# Patient Record
Sex: Female | Born: 1940 | Race: White | Hispanic: No | Marital: Married | Smoking: Former smoker
Health system: Southern US, Community
[De-identification: ages and names within clinical notes are randomized; demographics above are authoritative.]

## PROBLEM LIST (undated history)

## (undated) DIAGNOSIS — I779 Disorder of arteries and arterioles, unspecified: Secondary | ICD-10-CM

## (undated) DIAGNOSIS — H35073 Retinal telangiectasis, bilateral: Secondary | ICD-10-CM

## (undated) DIAGNOSIS — E039 Hypothyroidism, unspecified: Secondary | ICD-10-CM

## (undated) DIAGNOSIS — I10 Essential (primary) hypertension: Secondary | ICD-10-CM

## (undated) DIAGNOSIS — I739 Peripheral vascular disease, unspecified: Secondary | ICD-10-CM

## (undated) DIAGNOSIS — Z952 Presence of prosthetic heart valve: Secondary | ICD-10-CM

## (undated) DIAGNOSIS — I350 Nonrheumatic aortic (valve) stenosis: Secondary | ICD-10-CM

## (undated) DIAGNOSIS — G459 Transient cerebral ischemic attack, unspecified: Secondary | ICD-10-CM

## (undated) DIAGNOSIS — M1990 Unspecified osteoarthritis, unspecified site: Secondary | ICD-10-CM

## (undated) DIAGNOSIS — E785 Hyperlipidemia, unspecified: Secondary | ICD-10-CM

## (undated) DIAGNOSIS — I4510 Unspecified right bundle-branch block: Secondary | ICD-10-CM

## (undated) DIAGNOSIS — I2510 Atherosclerotic heart disease of native coronary artery without angina pectoris: Secondary | ICD-10-CM

## (undated) DIAGNOSIS — G8929 Other chronic pain: Secondary | ICD-10-CM

## (undated) DIAGNOSIS — G4730 Sleep apnea, unspecified: Secondary | ICD-10-CM

## (undated) DIAGNOSIS — M549 Dorsalgia, unspecified: Secondary | ICD-10-CM

## (undated) HISTORY — PX: CARDIAC CATHETERIZATION: SHX172

## (undated) HISTORY — PX: TONSILLECTOMY AND ADENOIDECTOMY: SHX28

## (undated) HISTORY — PX: AORTIC VALVE REPLACEMENT: SHX41

## (undated) HISTORY — DX: Atherosclerotic heart disease of native coronary artery without angina pectoris: I25.10

## (undated) HISTORY — DX: Dorsalgia, unspecified: M54.9

## (undated) HISTORY — DX: Hyperlipidemia, unspecified: E78.5

## (undated) HISTORY — DX: Retinal telangiectasis, bilateral: H35.073

## (undated) HISTORY — PX: CAROTID ENDARTERECTOMY: SUR193

## (undated) HISTORY — DX: Essential (primary) hypertension: I10

## (undated) HISTORY — DX: Transient cerebral ischemic attack, unspecified: G45.9

## (undated) HISTORY — PX: BREAST SURGERY: SHX581

## (undated) HISTORY — DX: Other chronic pain: G89.29

## (undated) HISTORY — DX: Unspecified osteoarthritis, unspecified site: M19.90

## (undated) HISTORY — DX: Peripheral vascular disease, unspecified: I73.9

## (undated) HISTORY — PX: APPENDECTOMY: SHX54

## (undated) HISTORY — DX: Hypothyroidism, unspecified: E03.9

## (undated) HISTORY — PX: CHOLECYSTECTOMY: SHX55

## (undated) HISTORY — PX: CORONARY ANGIOPLASTY: SHX604

## (undated) HISTORY — DX: Nonrheumatic aortic (valve) stenosis: I35.0

## (undated) HISTORY — DX: Unspecified right bundle-branch block: I45.10

---

## 2007-07-18 LAB — HM COLONOSCOPY

## 2016-08-17 HISTORY — PX: BACK SURGERY: SHX140

## 2017-07-24 ENCOUNTER — Ambulatory Visit: Payer: Medicare Other | Admitting: Primary Care

## 2017-07-24 ENCOUNTER — Encounter: Payer: Self-pay | Admitting: Primary Care

## 2017-07-24 VITALS — BP 136/80 | HR 60 | Temp 97.6°F | Ht 59.5 in | Wt 197.8 lb

## 2017-07-24 DIAGNOSIS — E782 Mixed hyperlipidemia: Secondary | ICD-10-CM | POA: Insufficient documentation

## 2017-07-24 DIAGNOSIS — I10 Essential (primary) hypertension: Secondary | ICD-10-CM | POA: Diagnosis not present

## 2017-07-24 DIAGNOSIS — H35073 Retinal telangiectasis, bilateral: Secondary | ICD-10-CM | POA: Insufficient documentation

## 2017-07-24 DIAGNOSIS — E785 Hyperlipidemia, unspecified: Secondary | ICD-10-CM | POA: Insufficient documentation

## 2017-07-24 DIAGNOSIS — E039 Hypothyroidism, unspecified: Secondary | ICD-10-CM

## 2017-07-24 DIAGNOSIS — I2510 Atherosclerotic heart disease of native coronary artery without angina pectoris: Secondary | ICD-10-CM | POA: Diagnosis not present

## 2017-07-24 LAB — COMPREHENSIVE METABOLIC PANEL
ALBUMIN: 4.4 g/dL (ref 3.5–5.2)
ALK PHOS: 68 U/L (ref 39–117)
ALT: 16 U/L (ref 0–35)
AST: 20 U/L (ref 0–37)
BILIRUBIN TOTAL: 0.9 mg/dL (ref 0.2–1.2)
BUN: 14 mg/dL (ref 6–23)
CALCIUM: 9.5 mg/dL (ref 8.4–10.5)
CO2: 29 mEq/L (ref 19–32)
CREATININE: 0.78 mg/dL (ref 0.40–1.20)
Chloride: 103 mEq/L (ref 96–112)
GFR: 76.22 mL/min (ref >60.00)
Glucose, Bld: 113 mg/dL — ABNORMAL HIGH (ref 70–99)
Potassium: 4.3 mEq/L (ref 3.5–5.1)
Sodium: 139 mEq/L (ref 135–145)
TOTAL PROTEIN: 7.4 g/dL (ref 6.0–8.3)

## 2017-07-24 LAB — TSH: TSH: 1.76 u[IU]/mL (ref 0.35–4.50)

## 2017-07-24 NOTE — Progress Notes (Signed)
Subjective:    Patient ID: Victoria Burns, female    DOB: 1941/03/22, 77 y.o.   MRN: [id number]  HPI  [name] is a 77 year old female who presents today to establish care and discuss the problems mentioned below. Will obtain old records. She's had no recent physical.  She and her husband recently moved from [place].  1) Hypothyroidism: Currently managed on levothyroxine 25 mcg. Her last TSH was checked 6 months which was normal. She's not had a dose change in her medication for the past 6 years.   2) Hyperlipidemia/CAD/Carotid Stenosis: History of cardiac cath with 5 stents placed, also with bilateral endarcardectomy. Currently managed on atorvastatin 80 mg. She was following with cardiology in [facility] semi-annually.   3) Essential Hypertension: Currently managed on losartan 100 mg, spironolactone 25 mg, and metoprolol tartrate 100 mg BID. She thinks she's supposed to be taking metoprolol 50 mg BID, so she's been cutting the 100 mg tablets in half for years.  She denies chest pain, shortness of breath, dizziness.  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 Underwent back surgery with implantation of rods and screws in February 2018. She was once receiveing injections to her back but was told that further injections would not be helpful.  Overall she is doing well since surgery and has no complaints.  Review of Systems  Constitutional: Negative for fatigue.  Respiratory: Negative for shortness of breath.   Cardiovascular: Negative for chest pain.  Musculoskeletal: Negative for back pain.  Neurological: Negative for dizziness and headaches.       Past Medical History:  Diagnosis Date  . Arthritis   . Arthritis   . Heart disease   . Heart murmur   . Hyperlipidemia   . Hypertension   . Juxtafoveal telangietasis of both eyes   . Thyroid disease      Social History   Socioeconomic  History  . Marital status: Married    Spouse name: Not on file  . Number of children: Not on file  . Years of education: Not on file  . Highest education level: Not on file  Social Needs  . Financial resource strain: Not on file  . Food insecurity - worry: Not on file  . Food insecurity - inability: Not on file  . Transportation needs - medical: Not on file  . Transportation needs - non-medical: Not on file  Occupational History  . Not on file  Tobacco Use  . Smoking status: Former Research scientist (life sciences)  . Smokeless tobacco: Never Used  Substance and Sexual Activity  . Alcohol use: No    Frequency: Never  . Drug use: No  . Sexual activity: Yes  Other Topics Concern  . Not on file  Social History Narrative  . Not on file      Family History  Problem Relation Age of Onset  . Heart disease Mother   . Hyperlipidemia Mother   . Hypertension Mother   . Alcohol abuse Father   . COPD Father   . Heart attack Father   . Arthritis Sister   . Hypertension Sister   . Cancer Sister   . COPD Sister   . Heart attack Sister   . Heart disease Sister   . Hypertension Sister     Allergies  Allergen Reactions  . Codeine Rash  . Penicillins Rash    Current Outpatient Medications on File Prior to  Visit  Medication Sig Dispense Refill  . aspirin 81 MG chewable tablet Chew 81 mg by mouth daily.    Marland Kitchen atorvastatin (LIPITOR) 80 MG tablet Take 80 mg by mouth at bedtime.    . Levothyroxine Sodium 25 MCG CAPS Take by mouth daily before breakfast.    . losartan (COZAAR) 100 MG tablet Take 100 mg by mouth daily.    . metoprolol tartrate (LOPRESSOR) 50 MG tablet Take 50 mg by mouth 2 (two) times daily.    . simvastatin (ZOCOR) 10 MG tablet Take 10 mg by mouth at bedtime.    Marland Kitchen spironolactone (ALDACTONE) 25 MG tablet Take 25 mg by mouth daily.     No current facility-administered medications on file prior to visit.     BP 136/80   Pulse 60   Temp 97.6 F (36.4 C) (Oral)   Ht 4' 11.5" (1.511 m)    Wt 197 lb 12 oz (89.7 kg)   SpO2 98%   BMI 39.27 kg/m    Objective:   Physical Exam  Constitutional: She appears well-nourished.  Neck: Neck supple.  Cardiovascular: Normal rate and regular rhythm.  Pulmonary/Chest: Effort normal and breath sounds normal.  Skin: Skin is warm and dry.  Psychiatric: She has a normal mood and affect.          Assessment & Plan:

## 2017-07-24 NOTE — Assessment & Plan Note (Signed)
Endorses recent normal lipid panel.  Continue atorvastatin 80 mg.  Will obtain records for recent panel.

## 2017-07-24 NOTE — Assessment & Plan Note (Signed)
TSH pending today.  Will obtain records from prior PCP.

## 2017-07-24 NOTE — Assessment & Plan Note (Addendum)
History of stent placement x5, bilateral endarcardectomy.  Continue aspirin, atorvastatin.  Referral placed for local cardiologist.  Will obtain records.

## 2017-07-24 NOTE — Patient Instructions (Signed)
You will be contacted regarding your referral to cardiology and opthalmology.  Please let us know if you have not been contacted within one week.   Stop by the lab prior to leaving today. I will notify you of your results once received.   Continue taking 1/2 tablet of your metoprolol twice daily, I'll change your prescription to 50 mg twice daily for your next refill. Please notify [facility] that I'll be your new PCP.  Stop taking Simvastatin for now until I can review your records.   It was a pleasure to meet you today! Please don't hesitate to call or message me with any questions. Welcome to [facility]!

## 2017-07-24 NOTE — Assessment & Plan Note (Signed)
Stable in the office today.  Continue losartan, spironolactone, metoprolol.  Will correct dose of metoprolol to 50 mg twice daily to avoid having to cut tablets in half.

## 2017-07-24 NOTE — Assessment & Plan Note (Signed)
Referral placed for local ophthalmologist.

## 2017-07-31 ENCOUNTER — Encounter: Payer: Self-pay | Admitting: Primary Care

## 2017-08-15 DIAGNOSIS — H35073 Retinal telangiectasis, bilateral: Secondary | ICD-10-CM | POA: Diagnosis not present

## 2017-08-18 DIAGNOSIS — I6529 Occlusion and stenosis of unspecified carotid artery: Secondary | ICD-10-CM | POA: Insufficient documentation

## 2017-08-18 DIAGNOSIS — I739 Peripheral vascular disease, unspecified: Secondary | ICD-10-CM | POA: Insufficient documentation

## 2017-08-18 DIAGNOSIS — I25118 Atherosclerotic heart disease of native coronary artery with other forms of angina pectoris: Secondary | ICD-10-CM | POA: Insufficient documentation

## 2017-08-18 NOTE — Progress Notes (Signed)
Cardiology Office Note  Date:  08/21/2017   ID:  Victoria Burns, DOB 1940/12/16, MRN [id number]  PCP:  Pleas Koch, NP   Chief Complaint  Patient presents with  . New Patient (Initial Visit)    Referred by PCP to get evaluated by a cardiologist. Meds reviewed verbally with patient.     HPI:  Victoria Burns is a 77 year old woman with past medical history of Hypothyroidism Hyperlipidemia Coronary artery disease, history of stenting x1 Hypertension Chronic back pain PAD, bilateral carotid endarterectomy Former smoker Moderate AS Spinal surgery Prior sleep study AHI 13 events per hour and the RDI was 17 events per hour performed May 2016 Referred by [facility] [place] for consultation of her coronary artery disease  Last cardiac catheterization May 17, 2017 [facility] Normal right heart pressures Found to have stable coronary disease, moderate aortic valve stenosis LAD with mild luminal irregularities, left circumflex 30% mid disease, stent in OM 2 with 40% mid in-stent stenosis Right dominant vessel 50% disease  She reports having some fatigue, no chest pain on exertion  Previous stress test January 2018 defect noted in the Anterior anterolateral wall  Carotid ultrasound October 2017 noting 60-79% stenosis on the left  Echocardiogram October 2017 moderate aortic valve stenosis mean gradient 26 mmHg peak gradient 54 mmHg peak velocity 3.7 m/s  Cardiac catheterization January 2018 moderate to severe 60-70% disease proximal second OM vessel, prior stent in the OM 60% distal edge restenosis, moderate diffuse calcification, small vessels throughout  EKG personally reviewed by myself on todays visit Showing sinus bradycardia rate 52 bpm right bundle branch block nonspecific ST abnormality   PMH:   has a past medical history of Arthritis, CAD (coronary artery disease), Chronic back pain, Heart murmur, Hyperlipidemia, Hypertension, Hypothyroidism, Juxtafoveal  telangietasis of both eyes, RBBB, and TIA (transient ischemic attack).  PSH:    Past Surgical History:  Procedure Laterality Date  . APPENDECTOMY    . BACK SURGERY  08/2016  . BREAST SURGERY     Biopsy  . CAROTID ENDARTERECTOMY Bilateral   . CHOLECYSTECTOMY    . TONSILLECTOMY AND ADENOIDECTOMY      Current Outpatient Medications  Medication Sig Dispense Refill  . aspirin 81 MG chewable tablet Chew 81 mg by mouth daily.    Marland Kitchen atorvastatin (LIPITOR) 80 MG tablet Take 80 mg by mouth at bedtime.    . Levothyroxine Sodium 25 MCG CAPS Take by mouth daily before breakfast.    . losartan (COZAAR) 100 MG tablet Take 100 mg by mouth daily.    . metoprolol tartrate (LOPRESSOR) 50 MG tablet Take 1 tablet (50 mg total) by mouth 2 (two) times daily. 180 tablet 3  . spironolactone (ALDACTONE) 25 MG tablet Take 25 mg by mouth daily.     No current facility-administered medications for this visit.      Allergies:   Codeine and Penicillins   Social History:  The patient  reports that she has quit smoking. she has never used smokeless tobacco. She reports that she does not drink alcohol or use drugs.   Family History:   family history includes Alcohol abuse in her father; Arthritis in her sister; COPD in her father and sister; Cancer in her sister; Heart attack in her father and sister; Heart disease in her mother and sister; Hyperlipidemia in her mother; Hypertension in her mother, sister, and sister.    Review of Systems: Review of Systems  Constitutional: Negative.   Respiratory: Positive for shortness of breath.  Cardiovascular: Negative.   Gastrointestinal: Negative.   Musculoskeletal: Negative.   Neurological: Negative.   All other systems reviewed and are negative.    PHYSICAL EXAM: VS:  BP 140/80 (BP Location: Right Arm, Patient Position: Sitting, Cuff Size: Normal)   Pulse (!) 52   Ht 5\' 1"  (1.549 m)   Wt 198 lb (89.8 kg)   BMI 37.41 kg/m  , BMI Body mass index is 37.41  kg/m. GEN: Well nourished, well developed, in no acute distress , obese HEENT: normal  Neck: no JVD, carotid bruits, or masses Cardiac: RRR; no murmurs, rubs, or gallops,no edema  Respiratory:  clear to auscultation bilaterally, normal work of breathing GI: soft, nontender, nondistended, + BS MS: no deformity or atrophy  Skin: warm and dry, no rash Neuro:  Strength and sensation are intact Psych: euthymic mood, full affect    Recent Labs: 07/24/2017: ALT 16; BUN 14; Creatinine, Ser 0.78; Potassium 4.3; Sodium 139; TSH 1.76    Lipid Panel No results found for: CHOL, HDL, LDLCALC, TRIG    Wt Readings from Last 3 Encounters:  08/21/17 198 lb (89.8 kg)  07/24/17 197 lb 12 oz (89.7 kg)       ASSESSMENT AND PLAN:  Essential hypertension Bradycardia, possibly symptomatic with fatigue. Taking metoprolol 50 twice daily New prescription sent in but recommended she try metoprolol 25 twice daily with close monitoring of her blood pressure  Coronary artery disease of native artery of native heart with stable angina pectoris ([place]) - Plan: EKG 12-Lead Previous cardiac catheterization scans reviewed with her in detail Most recently May 17, 2017, moderate nonobstructive disease, medical management recommended Stressed importance of aggressive lipid management  Mixed hyperlipidemia No labs available Recommend she have routine lab work with primary care, fasting lipid panel Goal LDL less than 70 She will stay on Lipitor 80 for now.  Continue all of her prescription is been placed  PAD (peripheral artery disease) ([place]) Known bilateral carotid disease Carotid endarterectomy on the left Consider carotid ultrasound on next clinic visit  Disposition:   F/U  6 months  Previous records requested, Extensive records reviewed, including echocardiograms, cardiac catheterizations, stress testing  Total encounter time more than 60 minutes  Greater than 50% was spent in counseling and  coordination of care with the patient    Orders Placed This Encounter  Procedures  . EKG 12-Lead     Signed, Esmond Plants, M.D., Ph.D. 08/21/2017  [place], [place]

## 2017-08-21 ENCOUNTER — Encounter: Payer: Self-pay | Admitting: Cardiovascular Disease

## 2017-08-21 ENCOUNTER — Ambulatory Visit: Payer: Medicare Other | Admitting: Cardiovascular Disease

## 2017-08-21 VITALS — BP 140/80 | HR 52 | Ht 61.0 in | Wt 198.0 lb

## 2017-08-21 DIAGNOSIS — I10 Essential (primary) hypertension: Secondary | ICD-10-CM | POA: Diagnosis not present

## 2017-08-21 DIAGNOSIS — I739 Peripheral vascular disease, unspecified: Secondary | ICD-10-CM | POA: Diagnosis not present

## 2017-08-21 DIAGNOSIS — I6523 Occlusion and stenosis of bilateral carotid arteries: Secondary | ICD-10-CM

## 2017-08-21 DIAGNOSIS — H35073 Retinal telangiectasis, bilateral: Secondary | ICD-10-CM

## 2017-08-21 DIAGNOSIS — E782 Mixed hyperlipidemia: Secondary | ICD-10-CM

## 2017-08-21 DIAGNOSIS — I25118 Atherosclerotic heart disease of native coronary artery with other forms of angina pectoris: Secondary | ICD-10-CM

## 2017-08-21 MED ORDER — METOPROLOL TARTRATE 50 MG PO TABS: 50.0000 mg | ORAL_TABLET | Freq: Two times a day (BID) | ORAL | 3 refills | Status: DC

## 2017-08-21 NOTE — Patient Instructions (Addendum)
Medication Instructions:   Ok to decrease the metoprolol down to 25 mg twice a day if you have fatigue, low heart beat  Labwork:  No new labs needed  Testing/Procedures:  No further testing at this time   Follow-Up: It was a pleasure seeing you in the office today. Please call us if you have new issues that need to be addressed before your next appt.  [phone]  Your physician wants you to follow-up in: 6 months.  You will receive a reminder letter in the mail two months in advance. If you don't receive a letter, please call our office to schedule the follow-up appointment.  If you need a refill on your cardiac medications before your next appointment, please call your pharmacy.

## 2017-10-26 ENCOUNTER — Telehealth: Payer: Self-pay | Admitting: Primary Care

## 2017-10-26 DIAGNOSIS — M79673 Pain in unspecified foot: Principal | ICD-10-CM

## 2017-10-26 DIAGNOSIS — G8929 Other chronic pain: Secondary | ICD-10-CM

## 2017-10-26 NOTE — Telephone Encounter (Signed)
Noted, referral placed.  

## 2017-10-26 NOTE — Telephone Encounter (Signed)
Copied from [place]. Topic: Referral - Request >> Oct 26, 2017  1:01 PM Marin Olp L wrote: Reason for CRM: Patient would like a referral to a podiatrist and would like it recommended by NP Clark. She does not have any preferred location and it's for arthritis in her feet. Please call her once the referral is placed.

## 2017-11-05 ENCOUNTER — Ambulatory Visit: Payer: Self-pay | Admitting: Podiatry

## 2017-11-12 ENCOUNTER — Other Ambulatory Visit: Payer: Self-pay | Admitting: Podiatry

## 2017-11-12 ENCOUNTER — Ambulatory Visit (INDEPENDENT_AMBULATORY_CARE_PROVIDER_SITE_OTHER): Payer: Medicare Other

## 2017-11-12 ENCOUNTER — Ambulatory Visit: Payer: Medicare Other | Admitting: Podiatry

## 2017-11-12 ENCOUNTER — Encounter: Payer: Self-pay | Admitting: Podiatry

## 2017-11-12 VITALS — BP 173/71 | HR 70

## 2017-11-12 DIAGNOSIS — M1990 Unspecified osteoarthritis, unspecified site: Secondary | ICD-10-CM | POA: Diagnosis not present

## 2017-11-12 DIAGNOSIS — R52 Pain, unspecified: Secondary | ICD-10-CM

## 2017-11-12 DIAGNOSIS — M205X9 Other deformities of toe(s) (acquired), unspecified foot: Secondary | ICD-10-CM

## 2017-11-12 DIAGNOSIS — M129 Arthropathy, unspecified: Secondary | ICD-10-CM

## 2017-11-12 DIAGNOSIS — M2020 Hallux rigidus, unspecified foot: Secondary | ICD-10-CM

## 2017-11-12 MED ORDER — MELOXICAM 7.5 MG PO TABS: 7.5000 mg | ORAL_TABLET | Freq: Every day | ORAL | 0 refills | Status: DC

## 2017-11-12 NOTE — Progress Notes (Signed)
This patient presents to the office with chief complaint of painful feet.  She says she was diagnosed with arthritis of her foot years ago.  She says she is experiencing pain and discomfort through the top of both feet with her left foot more painful than the right.  She says she then starts to experience burning pain noted through the arch into the toes on the bottom of both feet.  She says she has been treated with injection therapy and multiple pairs of orthoses.  She says the pain is worsening and she is concerned about her foot, improving for her summer vacation.  She presents the office today for an evaluation and treatment of her painful feet.  General Appearance  Alert, conversant and in no acute stress.  Vascular  Dorsalis pedis and posterior tibial  pulses are palpable  bilaterally.  Capillary return is within normal limits  bilaterally. Temperature is within normal limits  bilaterally.  Neurologic  Senn-Weinstein monofilament wire test within normal limits  bilaterally. Muscle power within normal limits bilaterally.  Nails normal nails noted with no evidence of any bacterial or fungal infection.  Orthopedic  No limitations of motion of motion feet .  No crepitus or effusions noted.dorsal exostosis noted at the head of the first metatarsal left foot.  Mild HAV deformity bilaterally.  Significant arthritic changes felt at the level of the [facility] joint, left foot, greater than the right. Excessive pronation is noted upon standing, both feet. Flexible pes planus foot type.  Skin  normotropic skin with no porokeratosis noted bilaterally.  No signs of infections or ulcers noted.  Mucoid cyst noted DIPJ third toe, left foot.  Chronic Foot Arthritis  B/L.  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  IE>  X-rays taken reveal calcification at the insertion of the plantar fascia left greater than the right.  Chronic arthritic changes noted at the [facility] bilaterally.  Arthritic  changes first MPJ left foot. Discussed this condition with this patient.  Told her that the flexible pes planus foot type has led to arthritis in the midfoot and the big toe joint, left foot.  I told her she would benefit from a good pair of orthoses in thick soled shoes.  She says she has multiple orthoses which she will go home and start wearing.  Prescribed Mobic 7.5 mg 1 daily.  Return to the clinic in 2 months for further evaluation and treatment   Gardiner Barefoot DPM

## 2017-12-04 ENCOUNTER — Telehealth: Payer: Self-pay | Admitting: Podiatry

## 2017-12-04 MED ORDER — MELOXICAM 7.5 MG PO TABS: ORAL_TABLET | ORAL | 1 refills | Status: DC

## 2017-12-04 NOTE — Addendum Note (Signed)
Addended by: Harriett Sine D on: 12/04/2017 02:49 PM   Modules accepted: Orders

## 2017-12-04 NOTE — Telephone Encounter (Signed)
Pt. Was taking pain medication and does not feel like it is doing anything. Medication is not bothering my stomach, not sure if you want to change the dosage. Refill  RX would need to be sent to [facility] on [address]

## 2017-12-04 NOTE — Telephone Encounter (Addendum)
Dr. Prudence Davidson states if pt is not having any problem taking the meloxicam 7.5mg , then she could take the meloxicam 7.5mg  twice daily with food as needed for pain. I informed pt of Dr. Burnell Blanks orders.

## 2017-12-09 ENCOUNTER — Other Ambulatory Visit: Payer: Self-pay | Admitting: Podiatry

## 2018-01-14 ENCOUNTER — Ambulatory Visit: Payer: Medicare Other | Admitting: Podiatry

## 2018-01-14 ENCOUNTER — Encounter: Payer: Self-pay | Admitting: Podiatry

## 2018-01-14 DIAGNOSIS — M2020 Hallux rigidus, unspecified foot: Secondary | ICD-10-CM

## 2018-01-14 DIAGNOSIS — M205X9 Other deformities of toe(s) (acquired), unspecified foot: Secondary | ICD-10-CM

## 2018-01-14 DIAGNOSIS — M129 Arthropathy, unspecified: Secondary | ICD-10-CM

## 2018-01-14 MED ORDER — GABAPENTIN 100 MG PO CAPS: 100.0000 mg | ORAL_CAPSULE | Freq: Three times a day (TID) | ORAL | 0 refills | Status: DC

## 2018-01-14 NOTE — Progress Notes (Signed)
This patient returns to the office follow-up for continued evaluation of both feet.  She has been diagnosed with chronic for her arthritis on both feet as well as a hallux limitus bilaterally.  She was treated with Mobic 7.5 mg daily, which was then increased to 15 mg daily with minimal relief.  She says that she  is no better and she presents the office for continued evaluation and treatment. She relates her pain today is more of a burning type of pain. She says that her family member was treated with gabapentin and asked if it would help her feet.  She presents the office today wearing sandals for her appointment.    General Appearance  Alert, conversant and in no acute stress.  Vascular  Dorsalis pedis and posterior tibial  pulses are palpable  bilaterally.  Capillary return is within normal limits  bilaterally. Temperature is within normal limits  bilaterally.  Neurologic  Senn-Weinstein monofilament wire test within normal limits  bilaterally. Muscle power within normal limits bilaterally.  Nails normal nails noted with no evidence of any bacterial or fungal infection  Orthopedic  No limitations of motion of motion feet .  No crepitus or effusions noted. Mild HAV deformity bilaterally.  Arthritic changes noted at the mid foot bilaterally with the left greater than the right.  Flexible pes planus foot type.  Skin  normotropic skin with no porokeratosis noted bilaterally.  No signs of infections or ulcers noted.    Chronic arthritis  B/L  FHL  B/L  Pes planus  ROV.  Reexamination of her foot reveals no evidence of any inflammation.  No increased temperature  or swelling noted.  Told patient that her problem is the lack of support when she walks.  Told her she should try the power step insoles and if they are successful to then be evaluated by Liliane Channel.  Prescribed gabapentin 100 mg 1 3 times a day.  Discontinue the Mobic since it has been ineffective.    RTC 4 weeks    Gardiner Barefoot DPM

## 2018-01-21 ENCOUNTER — Ambulatory Visit: Payer: Medicare Other | Admitting: Primary Care

## 2018-01-21 ENCOUNTER — Telehealth: Payer: Self-pay

## 2018-01-21 NOTE — Telephone Encounter (Signed)
Attempted to reach patient to schedule AWV. Patient declined appt at this time.

## 2018-01-23 ENCOUNTER — Ambulatory Visit (INDEPENDENT_AMBULATORY_CARE_PROVIDER_SITE_OTHER): Payer: Medicare Other | Admitting: Primary Care

## 2018-01-23 VITALS — BP 126/68 | HR 57 | Temp 98.0°F | Ht 61.0 in | Wt 201.5 lb

## 2018-01-23 DIAGNOSIS — E782 Mixed hyperlipidemia: Secondary | ICD-10-CM | POA: Diagnosis not present

## 2018-01-23 DIAGNOSIS — I10 Essential (primary) hypertension: Secondary | ICD-10-CM

## 2018-01-23 DIAGNOSIS — I6523 Occlusion and stenosis of bilateral carotid arteries: Secondary | ICD-10-CM | POA: Diagnosis not present

## 2018-01-23 DIAGNOSIS — M8949 Other hypertrophic osteoarthropathy, multiple sites: Secondary | ICD-10-CM

## 2018-01-23 DIAGNOSIS — I25118 Atherosclerotic heart disease of native coronary artery with other forms of angina pectoris: Secondary | ICD-10-CM | POA: Diagnosis not present

## 2018-01-23 DIAGNOSIS — E039 Hypothyroidism, unspecified: Secondary | ICD-10-CM | POA: Diagnosis not present

## 2018-01-23 DIAGNOSIS — M159 Polyosteoarthritis, unspecified: Secondary | ICD-10-CM

## 2018-01-23 DIAGNOSIS — M150 Primary generalized (osteo)arthritis: Secondary | ICD-10-CM | POA: Diagnosis not present

## 2018-01-23 DIAGNOSIS — M1990 Unspecified osteoarthritis, unspecified site: Secondary | ICD-10-CM | POA: Insufficient documentation

## 2018-01-23 LAB — TSH: TSH: 1.76 u[IU]/mL (ref 0.35–4.50)

## 2018-01-23 LAB — LIPID PANEL
CHOL/HDL RATIO: 3
Cholesterol: 123 mg/dL (ref 0–200)
HDL: 48.1 mg/dL (ref >39.00)
LDL CALC: 57 mg/dL (ref 0–99)
NONHDL: 74.71
TRIGLYCERIDES: 90 mg/dL (ref 0.0–149.0)
VLDL: 18 mg/dL (ref 0.0–40.0)

## 2018-01-23 LAB — BASIC METABOLIC PANEL
BUN: 18 mg/dL (ref 6–23)
CO2: 29 mEq/L (ref 19–32)
CREATININE: 0.97 mg/dL (ref 0.40–1.20)
Calcium: 9.8 mg/dL (ref 8.4–10.5)
Chloride: 105 mEq/L (ref 96–112)
GFR: 59.19 mL/min — AB (ref >60.00)
Glucose, Bld: 107 mg/dL — ABNORMAL HIGH (ref 70–99)
POTASSIUM: 4.2 meq/L (ref 3.5–5.1)
Sodium: 141 mEq/L (ref 135–145)

## 2018-01-23 MED ORDER — ATORVASTATIN CALCIUM 80 MG PO TABS: ORAL_TABLET | ORAL | 3 refills | Status: DC

## 2018-01-23 MED ORDER — SPIRONOLACTONE 25 MG PO TABS: ORAL_TABLET | ORAL | 3 refills | Status: DC

## 2018-01-23 MED ORDER — LOSARTAN POTASSIUM 100 MG PO TABS
ORAL_TABLET | ORAL | 3 refills | Status: DC
Start: 2018-01-23 — End: 2019-01-08

## 2018-01-23 MED ORDER — LEVOTHYROXINE SODIUM 25 MCG PO CAPS: ORAL_CAPSULE | ORAL | 3 refills | Status: DC

## 2018-01-23 NOTE — Patient Instructions (Addendum)
Stop by the lab prior to leaving today. I will notify you of your results once received.   Schedule a follow up visit with your cardiologist as soon as possible.  Please schedule a follow up appointment in 6 months for follow up.   It was a pleasure to see you today!

## 2018-01-23 NOTE — Progress Notes (Signed)
Subjective:    Patient ID: Victoria Burns, female    DOB: 1941-04-01, 77 y.o.   MRN: [id number]  HPI  Victoria Burns is a 77 year old female who presents today for follow up.  1) Essential Hypertension: Currently managed on losartan 100 mg, metoprolol tartrate 50 mg BID, and spironolactone 25 mg.   BP Readings from Last 3 Encounters:  01/23/18 126/68  11/12/17 (!) 173/71  08/21/17 140/80   2) Hypothyroidism: Currently managed on levothyroxine 25 mcg. Her last TSH was 1.76 in January 2019. She is taking her levothyroxine every morning with water and is not eating or taking any other medications within 30 mins.   3) Hyperlipidemia: Currently managed on atorvastatin 80 mg. Due for lipid panel today.  4) CAD/Bilatreal Carotid Stenosis: Currently following with cardiology, plans on making an appointment soon. She has noticed intermittent chest pain 1-2 times weekly for the past 2-3 months, at exertion mostly. She will notice some improvement with rest but not total resolve. She's been taking her nitroglycerin more frequently with relief in chest pain almost immediately. She denies esophageal burning, belching, epigastric pain, nausea. She has not notified her cardiologist of these symptoms.  Her pain was never bad enough to seek hospital care.   5) Chronic Foot Pain: Currently following with podiatry and is managed on gabapentin 100 mg TID. Overall she's feeling better. Diagnosed with arthritis.   Review of Systems  Constitutional: Negative for fatigue.  Eyes: Negative for visual disturbance.  Respiratory: Negative for shortness of breath.   Cardiovascular: Positive for chest pain.  Musculoskeletal: Positive for arthralgias.  Neurological: Negative for dizziness.       Past Medical History:  Diagnosis Date  . Arthritis   . CAD (coronary artery disease)   . Chronic back pain   . Heart murmur   . Hyperlipidemia   . Hypertension   . Hypothyroidism   . Juxtafoveal telangietasis of  both eyes   . RBBB   . TIA (transient ischemic attack)      Social History   Socioeconomic History  . Marital status: Married    Spouse name: Not on file  . Number of children: Not on file  . Years of education: Not on file  . Highest education level: Not on file  Occupational History  . Not on file  Social Needs  . Financial resource strain: Not on file  . Food insecurity:    Worry: Not on file    Inability: Not on file  . Transportation needs:    Medical: Not on file    Non-medical: Not on file  Tobacco Use  . Smoking status: Former Research scientist (life sciences)  . Smokeless tobacco: Never Used  Substance and Sexual Activity  . Alcohol use: No    Frequency: Never  . Drug use: No  . Sexual activity: Yes  Lifestyle  . Physical activity:    Days per week: Not on file    Minutes per session: Not on file  . Stress: Not on file  Relationships  . Social connections:    Talks on phone: Not on file    Gets together: Not on file    Attends religious service: Not on file    Active member of club or organization: Not on file    Attends meetings of clubs or organizations: Not on file    Relationship status: Not on file  . Intimate partner violence:    Fear of current or ex partner: Not on file  Emotionally abused: Not on file    Physically abused: Not on file    Forced sexual activity: Not on file  Other Topics Concern  . Not on file  Social History Narrative  . Not on file    Past Surgical History:  Procedure Laterality Date  . APPENDECTOMY    . BACK SURGERY  08/2016  . BREAST SURGERY     Biopsy  . CAROTID ENDARTERECTOMY Bilateral   . CHOLECYSTECTOMY    . TONSILLECTOMY AND ADENOIDECTOMY      Family History  Problem Relation Age of Onset  . Heart disease Mother   . Hyperlipidemia Mother   . Hypertension Mother   . Alcohol abuse Father   . COPD Father   . Heart attack Father   . Arthritis Sister   . Hypertension Sister   . Cancer Sister   . COPD Sister   . Heart attack  Sister   . Heart disease Sister   . Hypertension Sister     Allergies  Allergen Reactions  . Codeine Rash  . Penicillins Rash    Current Outpatient Medications on File Prior to Visit  Medication Sig Dispense Refill  . aspirin 81 MG chewable tablet Chew 81 mg by mouth daily.    Marland Kitchen gabapentin (NEURONTIN) 100 MG capsule Take 1 capsule (100 mg total) by mouth 3 (three) times daily. 90 capsule 0  . metoprolol tartrate (LOPRESSOR) 50 MG tablet Take 1 tablet (50 mg total) by mouth 2 (two) times daily. 180 tablet 3  . nitroGLYCERIN (NITROSTAT) 0.3 MG SL tablet Place 0.3 mg under the tongue every 5 (five) minutes as needed.     No current facility-administered medications on file prior to visit.     BP 126/68   Pulse (!) 57   Temp 98 F (36.7 C) (Oral)   Ht 5\' 1"  (1.549 m)   Wt 201 lb 8 oz (91.4 kg)   SpO2 95%   BMI 38.07 kg/m    Objective:   Physical Exam  Constitutional: She appears well-nourished.  Neck: Neck supple. Carotid bruit is present.  Cardiovascular: Normal rate and regular rhythm.  Murmur heard. Aortic murmur noted on exam, radiation to bilateral carotids.   Respiratory: Effort normal and breath sounds normal.  Skin: Skin is warm and dry.  Psychiatric: She has a normal mood and affect.           Assessment & Plan:

## 2018-01-23 NOTE — Assessment & Plan Note (Signed)
Chronic to feet, following with podiatry and doing well on gabapentin.

## 2018-01-23 NOTE — Assessment & Plan Note (Addendum)
Chest pain intermittently x 2-3 months, improved with nitroglycerin.   ECG today with sinus brady, RBBB, no ST elevation, no t-wave inversion other than in AVR and V1. ECG unchanged from February 2019.  Check BMP to rule out hyperkalemia as she's on spironolactone and losartan. Also check TSH, Lipids today. Strongly advised her to schedule a follow up visit with her cardiologist as she may need stress test.   She is stable for outpatient treatment today.

## 2018-01-23 NOTE — Assessment & Plan Note (Signed)
Lipids due, pending.  Discussed the importance of a healthy diet and regular exercise in order for weight loss, and to reduce the risk of any potential medical problems. Continue high intensity statin.

## 2018-01-23 NOTE — Assessment & Plan Note (Signed)
Bruits noted on exam today, bilaterally.  Last carotid ultrasound reviewed, will defer to cardiology for repeat. Likely due.

## 2018-01-23 NOTE — Assessment & Plan Note (Signed)
Stable in the office today, continue current regimen. Refills sent to pharmacy. BMP pending.

## 2018-01-23 NOTE — Assessment & Plan Note (Signed)
Taking levothyroxine appropriately, refills sent to pharmacy. Repeat TSH pending.

## 2018-01-28 ENCOUNTER — Other Ambulatory Visit: Payer: Self-pay | Admitting: *Deleted

## 2018-01-28 ENCOUNTER — Other Ambulatory Visit: Payer: Self-pay | Admitting: Primary Care

## 2018-01-28 ENCOUNTER — Telehealth: Payer: Self-pay | Admitting: Primary Care

## 2018-01-28 DIAGNOSIS — E039 Hypothyroidism, unspecified: Secondary | ICD-10-CM

## 2018-01-28 MED ORDER — LEVOTHYROXINE SODIUM 25 MCG PO CAPS: ORAL_CAPSULE | ORAL | 3 refills | Status: DC

## 2018-01-28 NOTE — Telephone Encounter (Signed)
Rx resent to [facility] per patient request. LOV: 01/23/18

## 2018-01-28 NOTE — Telephone Encounter (Signed)
Copied from [place] [id number]. Topic: Quick Communication - See Telephone Encounter >> Jan 28, 2018  9:14 AM Mylinda Latina, NT wrote: CRM for notification. See Telephone encounter for: 01/28/18. Patient called and states that she needs the Levothyroxine Sodium 25 MCG CAPS resent to   [place], [place] [phone] (Phone) they didn't received the first RX. Thank you  [phone] (Fax)

## 2018-01-29 ENCOUNTER — Other Ambulatory Visit: Payer: Self-pay | Admitting: Primary Care

## 2018-01-29 ENCOUNTER — Other Ambulatory Visit: Payer: Medicare Other

## 2018-01-29 DIAGNOSIS — N289 Disorder of kidney and ureter, unspecified: Secondary | ICD-10-CM

## 2018-01-30 ENCOUNTER — Telehealth: Payer: Self-pay | Admitting: Primary Care

## 2018-01-30 DIAGNOSIS — E039 Hypothyroidism, unspecified: Secondary | ICD-10-CM

## 2018-01-30 MED ORDER — LEVOTHYROXINE SODIUM 25 MCG PO TABS: ORAL_TABLET | ORAL | 3 refills | Status: DC

## 2018-01-30 NOTE — Telephone Encounter (Signed)
Pts husband is calling stating [facility] will not refill prescription until they have clarification due to 2 scripts being sent. [facility] # [id number]

## 2018-01-30 NOTE — Telephone Encounter (Signed)
Called [facility] and was told that insurance will cover tablets not capsule.   Confirm and notified patient of this issue.   [facility] will processed the refill and send it to patient.

## 2018-01-30 NOTE — Telephone Encounter (Signed)
New prescription for tablets sent to [facility].

## 2018-01-30 NOTE — Telephone Encounter (Signed)
Copied from [place]. Topic: Quick Communication - See Telephone Encounter >> Jan 30, 2018  3:05 PM Vernona Rieger wrote: CRM for notification. See Telephone encounter for: 01/30/18.  [facility] would like to know if they can dispense tablets instead of capsules due to insurance not covering the capsules for Levothyroxine Sodium 25 MCG CAPS Reference number [id number]

## 2018-02-04 ENCOUNTER — Telehealth: Payer: Self-pay | Admitting: Podiatry

## 2018-02-04 NOTE — Telephone Encounter (Signed)
Pt needs to have a refill on Gabapentin sent to [facility] order.

## 2018-02-06 ENCOUNTER — Other Ambulatory Visit: Payer: Self-pay | Admitting: Podiatry

## 2018-02-06 NOTE — Telephone Encounter (Signed)
Here it is. gam

## 2018-02-06 NOTE — Telephone Encounter (Signed)
Telephone   02/04/2018 [facility] [place] [facility] [place], [place]  Podiatry   Medication Refill  Reason for call   Conversation: Medication Refill  (Newest Message First)        02/04/18 9:51 AM  You routed this conversation to Mack Hook, LPN  Me       8/33/82 9:50 AM  Note    Pt needs to have a refill on Gabapentin sent to [facility] order.           02/04/18 9:50 AM    [name] contacted Me   Additional Documentation   Encounter Info:   Billing Info,   History,   Allergies,   Detailed Report     Orders Placed    None  Medication Renewals and Changes     None    Medication List   Visit Diagnoses     Pt called to get a refill on Gabapentin sent to [facility] mail order. I sent this to Angie, but she hasent responded.

## 2018-02-08 MED ORDER — GABAPENTIN 100 MG PO CAPS: 100.0000 mg | ORAL_CAPSULE | Freq: Three times a day (TID) | ORAL | 0 refills | Status: DC

## 2018-02-08 NOTE — Telephone Encounter (Signed)
Script has been sent to [facility] pharmacy for 1 month supply per Dr. Prudence Davidson verbal order.  Patient will need follow up visit before any other refills given

## 2018-02-08 NOTE — Addendum Note (Signed)
Addended by: Graceann Congress D on: 02/08/2018 10:38 AM   Modules accepted: Orders

## 2018-02-13 ENCOUNTER — Other Ambulatory Visit (INDEPENDENT_AMBULATORY_CARE_PROVIDER_SITE_OTHER): Payer: Medicare Other

## 2018-02-13 DIAGNOSIS — N289 Disorder of kidney and ureter, unspecified: Secondary | ICD-10-CM

## 2018-02-13 LAB — BASIC METABOLIC PANEL
BUN: 14 mg/dL (ref 6–23)
CHLORIDE: 102 meq/L (ref 96–112)
CO2: 30 meq/L (ref 19–32)
Calcium: 9.9 mg/dL (ref 8.4–10.5)
Creatinine, Ser: 0.92 mg/dL (ref 0.40–1.20)
GFR: 62.9 mL/min (ref >60.00)
GLUCOSE: 107 mg/dL — AB (ref 70–99)
Potassium: 4.7 mEq/L (ref 3.5–5.1)
SODIUM: 139 meq/L (ref 135–145)

## 2018-02-14 ENCOUNTER — Ambulatory Visit: Payer: Medicare Other | Admitting: Cardiovascular Disease

## 2018-02-14 ENCOUNTER — Encounter: Payer: Self-pay | Admitting: Cardiovascular Disease

## 2018-02-14 VITALS — BP 130/62 | HR 61 | Ht 61.0 in | Wt 195.0 lb

## 2018-02-14 DIAGNOSIS — I25118 Atherosclerotic heart disease of native coronary artery with other forms of angina pectoris: Secondary | ICD-10-CM

## 2018-02-14 DIAGNOSIS — I739 Peripheral vascular disease, unspecified: Secondary | ICD-10-CM

## 2018-02-14 DIAGNOSIS — I6523 Occlusion and stenosis of bilateral carotid arteries: Secondary | ICD-10-CM

## 2018-02-14 DIAGNOSIS — E782 Mixed hyperlipidemia: Secondary | ICD-10-CM | POA: Diagnosis not present

## 2018-02-14 DIAGNOSIS — I10 Essential (primary) hypertension: Secondary | ICD-10-CM | POA: Diagnosis not present

## 2018-02-14 MED ORDER — ISOSORBIDE MONONITRATE ER 30 MG PO TB24: 30.0000 mg | ORAL_TABLET | Freq: Every day | ORAL | 3 refills | Status: DC

## 2018-02-14 NOTE — Progress Notes (Signed)
Cardiology Office Note  Date:  02/14/2018   ID:  COMFORT IVERSEN, DOB Jul 17, 1941, MRN [id number]  PCP:  Pleas Koch, NP   Chief Complaint  Patient presents with  . OTHER    6 month f/u c/o palpitations. Meds reviewed verbally with pt.    HPI:  Ms. Victoria Burns is a 77 year old woman with past medical history of Hypothyroidism Hyperlipidemia Coronary artery disease, history of stenting x1 Hypertension Chronic back pain PAD, bilateral carotid endarterectomy, 15 yr ago 1 year ago Former smoker Moderate AS Spinal surgery Prior sleep study AHI 13 events per hour and the RDI was 17 events per hour performed May 2016 Presenting for f/u of her  coronary artery disease  In follow-up today she reports that she is having more chest pain requiring NTG At rest and sometimes with stress 3x a week  Last cardiac catheterization May 17, 2017 [facility] heart clinic  stable coronary disease, moderate aortic valve stenosis LAD with mild luminal irregularities, left circumflex 30% mid disease, stent in OM 2 with 40% mid in-stent stenosis Right dominant vessel 50% disease  No workup since that time No regular exercise program  Denies any leg edema PND orthopnea  EKG personally reviewed by myself on todays visit Shows normal sinus rhythm rate 61 bpm right bundle branch block  Other past medical history reviewed Carotid ultrasound October 2017 noting 60-79% stenosis on the left  Echocardiogram October 2017 moderate aortic valve stenosis mean gradient 26 mmHg peak gradient 54 mmHg peak velocity 3.7 m/s  Cardiac catheterization January 2018 moderate to severe 60-70% disease proximal second OM vessel, prior stent in the OM 60% distal edge restenosis, moderate diffuse calcification, small vessels throughout  PMH:   has a past medical history of Arthritis, CAD (coronary artery disease), Chronic back pain, Heart murmur, Hyperlipidemia, Hypertension, Hypothyroidism, Juxtafoveal telangietasis  of both eyes, RBBB, and TIA (transient ischemic attack).  PSH:    Past Surgical History:  Procedure Laterality Date  . APPENDECTOMY    . BACK SURGERY  08/2016  . BREAST SURGERY     Biopsy  . CAROTID ENDARTERECTOMY Bilateral   . CHOLECYSTECTOMY    . TONSILLECTOMY AND ADENOIDECTOMY      Current Outpatient Medications  Medication Sig Dispense Refill  . aspirin 81 MG chewable tablet Chew 81 mg by mouth daily.    Marland Kitchen atorvastatin (LIPITOR) 80 MG tablet Take 1 tablet by mouth at bedtime for cholesterol. 90 tablet 3  . gabapentin (NEURONTIN) 100 MG capsule Take 1 capsule (100 mg total) by mouth 3 (three) times daily. 90 capsule 0  . levothyroxine (SYNTHROID, LEVOTHROID) 25 MCG tablet Take 1 tablet by mouth every morning on an empty stomach with water. No food or medications for 30 minutes. 90 tablet 3  . losartan (COZAAR) 100 MG tablet Take 1 tablet by mouth once daily for blood pressure. 90 tablet 3  . metoprolol tartrate (LOPRESSOR) 50 MG tablet Take 1 tablet (50 mg total) by mouth 2 (two) times daily. 180 tablet 3  . nitroGLYCERIN (NITROSTAT) 0.3 MG SL tablet Place 0.3 mg under the tongue every 5 (five) minutes as needed.    Marland Kitchen spironolactone (ALDACTONE) 25 MG tablet Take 1 tablet by mouth once daily for blood pressure and heart. 90 tablet 3   No current facility-administered medications for this visit.      Allergies:   Codeine and Penicillins   Social History:  The patient  reports that she has quit smoking. She has never used smokeless tobacco.  She reports that she does not drink alcohol or use drugs.   Family History:   family history includes Alcohol abuse in her father; Arthritis in her sister; COPD in her father and sister; Cancer in her sister; Heart attack in her father and sister; Heart disease in her mother and sister; Hyperlipidemia in her mother; Hypertension in her mother, sister, and sister.    Review of Systems: Review of Systems  Constitutional: Negative.    Respiratory: Negative.   Cardiovascular: Positive for chest pain.  Gastrointestinal: Negative.   Musculoskeletal: Negative.   Neurological: Negative.   All other systems reviewed and are negative.    PHYSICAL EXAM: VS:  BP 130/62 (BP Location: Left Arm, Patient Position: Sitting, Cuff Size: Large)   Pulse 61   Ht 5\' 1"  (1.549 m)   Wt 195 lb (88.5 kg)   BMI 36.84 kg/m  , BMI Body mass index is 36.84 kg/m. Constitutional:  oriented to person, place, and time. No distress. obese HENT:  Head: Normocephalic and atraumatic.  Eyes:  no discharge. No scleral icterus.  Neck: Normal range of motion. Neck supple. No JVD present.  Cardiovascular: Normal rate, regular rhythm, normal heart sounds and intact distal pulses. Exam reveals no gallop and no friction rub. No edema No murmur heard. Pulmonary/Chest: Effort normal and breath sounds normal. No stridor. No respiratory distress.  no wheezes.  no rales.  no tenderness.  Abdominal: Soft.  no distension.  no tenderness.  Musculoskeletal: Normal range of motion.  no  tenderness or deformity.  Neurological:  normal muscle tone. Coordination normal. No atrophy Skin: Skin is warm and dry. No rash noted. not diaphoretic.  Psychiatric:  normal mood and affect. behavior is normal. Thought content normal.    Recent Labs: 07/24/2017: ALT 16 01/23/2018: TSH 1.76 02/13/2018: BUN 14; Creatinine, Ser 0.92; Potassium 4.7; Sodium 139    Lipid Panel Lab Results  Component Value Date   CHOL 123 01/23/2018   HDL 48.10 01/23/2018   LDLCALC 57 01/23/2018   TRIG 90.0 01/23/2018      Wt Readings from Last 3 Encounters:  02/14/18 195 lb (88.5 kg)  01/23/18 201 lb 8 oz (91.4 kg)  08/21/17 198 lb (89.8 kg)       ASSESSMENT AND PLAN:  Essential hypertension Blood pressure is well controlled on today's visit.  Isosorbide mononitrate 30 mg daily for stable angina symptoms  Coronary artery disease of native artery of native heart with stable angina  pectoris ([place]) - Plan: EKG 12-Lead  cardiac catheterization  May 17, 2017, moderate nonobstructive disease,  Now with worsening chest pain symptoms recurring nitroglycerin Pharmacological Myoview stress test has been ordered  Mixed hyperlipidemia Cholesterol is at goal on the current lipid regimen. No changes to the medications were made.  PAD (peripheral artery disease) (HCC) History of Bilateral carotid endarterectomy We have ordered carotid ultrasounds  Disposition:   F/U  12 months   long discussion concerning various testing above,   Total encounter time more than 45 minutes  Greater than 50% was spent in counseling and coordination of care with the patient    Orders Placed This Encounter  Procedures  . EKG 12-Lead     Signed, Esmond Plants, M.D., Ph.D. 02/14/2018  [place], [place]

## 2018-02-14 NOTE — Patient Instructions (Addendum)
Medication Instructions:   Please start imdur/isosorbide one a day Hold until after her procedure.   Labwork:  No new labs needed  Testing/Procedures:   We will schedule a lexiscan myoview for chest pain, angina, known CAD prior stent [place]  Your caregiver has ordered a Stress Test with nuclear imaging. The purpose of this test is to evaluate the blood supply to your heart muscle. This procedure is referred to as a "Non-Invasive Stress Test." This is because other than having an IV started in your vein, nothing is inserted or "invades" your body. Cardiac stress tests are done to find areas of poor blood flow to the heart by determining the extent of coronary artery disease (CAD). Some patients exercise on a treadmill, which naturally increases the blood flow to your heart, while others who are  unable to walk on a treadmill due to physical limitations have a pharmacologic/chemical stress agent called Lexiscan . This medicine will mimic walking on a treadmill by temporarily increasing your coronary blood flow.   Please note: these test may take anywhere between 2-4 hours to complete  PLEASE REPORT TO [facility] AT THE FIRST DESK WILL DIRECT YOU WHERE TO GO  Date of Procedure:_____________________________________  Arrival Time for Procedure:______________________________  Instructions regarding medication:   __XX__:  Hold Metoprolol the night before procedure and morning of procedure   PLEASE NOTIFY THE OFFICE AT LEAST 24 HOURS IN ADVANCE IF YOU ARE UNABLE TO KEEP YOUR APPOINTMENT.  [phone] AND  PLEASE NOTIFY NUCLEAR MEDICINE AT [facility] AT LEAST 24 HOURS IN ADVANCE IF YOU ARE UNABLE TO KEEP YOUR APPOINTMENT. [phone]  How to prepare for your Myoview test:  1. Do not eat or drink after midnight 2. No caffeine for 24 hours prior to test 3. No smoking 24 hours prior to test. 4. Your medication may be taken with water.  If your doctor  stopped a medication because of this test, do not take that medication. 5. Ladies, please do not wear dresses.  Skirts or pants are appropriate. Please wear a short sleeve shirt. 6. No perfume, cologne or lotion. 7. Wear comfortable walking shoes. No heels!   We will order an echocardiogram for aortic valve stenosis Carotid u/s for stenosis, b/l CEA  Your physician has requested that you have an echocardiogram. Echocardiography is a painless test that uses sound waves to create images of your heart. It provides your doctor with information about the size and shape of your heart and how well your heart's chambers and valves are working. This procedure takes approximately one hour. There are no restrictions for this procedure.  Your physician has requested that you have a carotid duplex. This test is an ultrasound of the carotid arteries in your neck. It looks at blood flow through these arteries that supply the brain with blood. Allow one hour for this exam. There are no restrictions or special instructions.    Follow-Up: It was a pleasure seeing you in the office today. Please call us if you have new issues that need to be addressed before your next appt.  [phone]  Your physician wants you to follow-up in: 12 months.  You will receive a reminder letter in the mail two months in advance. If you don't receive a letter, please call our office to schedule the follow-up appointment.  If you need a refill on your cardiac medications before your next appointment, please call your pharmacy.  For educational health videos Log in to :  [email or web address] Or : [email or web address], password : [email or web address]

## 2018-02-15 ENCOUNTER — Other Ambulatory Visit: Payer: Self-pay | Admitting: Cardiovascular Disease

## 2018-02-15 DIAGNOSIS — I6523 Occlusion and stenosis of bilateral carotid arteries: Secondary | ICD-10-CM

## 2018-02-15 DIAGNOSIS — I350 Nonrheumatic aortic (valve) stenosis: Secondary | ICD-10-CM

## 2018-02-22 ENCOUNTER — Telehealth: Payer: Self-pay | Admitting: *Deleted

## 2018-02-22 NOTE — Telephone Encounter (Signed)
Victoria Burns - [facility] states he is calling to check on a refill of the Gabapentin. I told Victoria Burns the refill was sent to pt's local pharmacy and she is to have an appt prior to future refills.

## 2018-02-28 ENCOUNTER — Ambulatory Visit (INDEPENDENT_AMBULATORY_CARE_PROVIDER_SITE_OTHER): Payer: Medicare Other

## 2018-02-28 ENCOUNTER — Other Ambulatory Visit: Payer: Self-pay

## 2018-02-28 DIAGNOSIS — I350 Nonrheumatic aortic (valve) stenosis: Secondary | ICD-10-CM

## 2018-02-28 DIAGNOSIS — I6523 Occlusion and stenosis of bilateral carotid arteries: Secondary | ICD-10-CM | POA: Diagnosis not present

## 2018-03-01 ENCOUNTER — Other Ambulatory Visit: Payer: Self-pay

## 2018-03-01 ENCOUNTER — Telehealth: Payer: Self-pay | Admitting: Podiatry

## 2018-03-01 MED ORDER — GABAPENTIN 100 MG PO CAPS: 100.0000 mg | ORAL_CAPSULE | Freq: Three times a day (TID) | ORAL | 0 refills | Status: DC

## 2018-03-01 NOTE — Telephone Encounter (Signed)
I'm calling with a question about my wife's prescription for the gabapentin. Please call me back at [phone]. Thank you.

## 2018-03-01 NOTE — Telephone Encounter (Signed)
Patient called requesting refill of Gabapentin to be sent to [facility] because its cheaper.  Per Dr Prudence Davidson, ok to send in 90 day supply to [facility].   Script has been sent

## 2018-03-03 ENCOUNTER — Other Ambulatory Visit: Payer: Self-pay | Admitting: Podiatry

## 2018-03-04 ENCOUNTER — Other Ambulatory Visit: Payer: Self-pay | Admitting: *Deleted

## 2018-03-04 DIAGNOSIS — I6523 Occlusion and stenosis of bilateral carotid arteries: Secondary | ICD-10-CM

## 2018-03-04 DIAGNOSIS — I359 Nonrheumatic aortic valve disorder, unspecified: Secondary | ICD-10-CM

## 2018-03-05 ENCOUNTER — Encounter
Admission: RE | Admit: 2018-03-05 | Discharge: 2018-03-05 | Disposition: A | Discharge status: Home / Self Care | Payer: Medicare Other | Source: Ambulatory Visit | Attending: Cardiovascular Disease | Admitting: Cardiovascular Disease

## 2018-03-05 DIAGNOSIS — I25118 Atherosclerotic heart disease of native coronary artery with other forms of angina pectoris: Secondary | ICD-10-CM

## 2018-03-05 DIAGNOSIS — E782 Mixed hyperlipidemia: Secondary | ICD-10-CM

## 2018-03-05 LAB — NM MYOCAR MULTI W/SPECT W/WALL MOTION / EF
CSEPHR: 59 %
CSEPPHR: 85 {beats}/min
LV dias vol: 77 mL (ref 46–106)
LV sys vol: 25 mL
Rest HR: 60 {beats}/min
TID: 0.85

## 2018-03-05 MED ORDER — REGADENOSON 0.4 MG/5ML IV SOLN
0.4000 mg | Freq: Once | INTRAVENOUS | Status: AC
  Administered 2018-03-05: 0.4 mg via INTRAVENOUS
  Filled 2018-03-05: qty 5

## 2018-03-05 MED ORDER — TECHNETIUM TC 99M TETROFOSMIN IV KIT
29.7200 | PACK | Freq: Once | INTRAVENOUS | Status: AC | PRN
  Administered 2018-03-05: 29.72 via INTRAVENOUS

## 2018-03-05 MED ORDER — TECHNETIUM TC 99M TETROFOSMIN IV KIT
13.6800 | PACK | Freq: Once | INTRAVENOUS | Status: AC | PRN
  Administered 2018-03-05: 13.68 via INTRAVENOUS

## 2018-04-08 ENCOUNTER — Ambulatory Visit (INDEPENDENT_AMBULATORY_CARE_PROVIDER_SITE_OTHER)
Admission: RE | Admit: 2018-04-08 | Discharge: 2018-04-08 | Disposition: A | Discharge status: Home / Self Care | Payer: Medicare Other | Source: Ambulatory Visit | Attending: Primary Care | Admitting: Primary Care

## 2018-04-08 ENCOUNTER — Other Ambulatory Visit: Payer: Self-pay | Admitting: Primary Care

## 2018-04-08 ENCOUNTER — Ambulatory Visit (INDEPENDENT_AMBULATORY_CARE_PROVIDER_SITE_OTHER): Payer: Medicare Other | Admitting: Primary Care

## 2018-04-08 DIAGNOSIS — M25519 Pain in unspecified shoulder: Secondary | ICD-10-CM | POA: Insufficient documentation

## 2018-04-08 DIAGNOSIS — M25511 Pain in right shoulder: Secondary | ICD-10-CM

## 2018-04-08 MED ORDER — NAPROXEN 500 MG PO TABS: 500.0000 mg | ORAL_TABLET | Freq: Two times a day (BID) | ORAL | 0 refills | Status: DC

## 2018-04-08 NOTE — Assessment & Plan Note (Signed)
Present for the last one month, no significant improvement with home treatment. Plain films pending today. Consider referral to PT once films return.   Will trial one week of Naproxen 500 mg BID. Do not recommend continuing NSAID's due to history of decreased renal function. Last BMP with normal renal function. Tylenol as needed.

## 2018-04-08 NOTE — Patient Instructions (Signed)
Start naproxen 500 mg tablets twice daily with food for one week for shoulder pain. Okay to take Tylenol in between if needed.  Complete xray(s) prior to leaving today. I will notify you of your results once received.  It was a pleasure to see you today!

## 2018-04-08 NOTE — Progress Notes (Signed)
Subjective:    Patient ID: Victoria Burns, female    DOB: 01/13/1941, 77 y.o.   MRN: [id number]  HPI  Victoria Burns is a 77 year old female with a history of osteoarthritis who presents today with a chief complaint of shoulder pain.   Her pain is located to the right shoulder which has been present for the last one month. She has noticed decrease in ROM with daily, persistent pain. The pain will radiate to the posterior shoulder and upper forearm.   She denies recent injury, trauma, stiffness, numbness/tingling.  She feels as though she has to support the arm due to pain. She's tried resting, working out the shoulder, applying ice, and taking Tylenol without significant improvement.   Review of Systems  Musculoskeletal: Positive for arthralgias. Negative for joint swelling.  Skin: Negative for color change.  Neurological: Negative for numbness.       Right upper extremity weakness due to pain       Past Medical History:  Diagnosis Date  . Arthritis   . CAD (coronary artery disease)   . Chronic back pain   . Heart murmur   . Hyperlipidemia   . Hypertension   . Hypothyroidism   . Juxtafoveal telangietasis of both eyes   . RBBB   . TIA (transient ischemic attack)      Social History   Socioeconomic History  . Marital status: Married    Spouse name: Not on file  . Number of children: Not on file  . Years of education: Not on file  . Highest education level: Not on file  Occupational History  . Not on file  Social Needs  . Financial resource strain: Not on file  . Food insecurity:    Worry: Not on file    Inability: Not on file  . Transportation needs:    Medical: Not on file    Non-medical: Not on file  Tobacco Use  . Smoking status: Former Research scientist (life sciences)  . Smokeless tobacco: Never Used  Substance and Sexual Activity  . Alcohol use: No    Frequency: Never  . Drug use: No  . Sexual activity: Yes  Lifestyle  . Physical activity:    Days per week: Not on file   Minutes per session: Not on file  . Stress: Not on file  Relationships  . Social connections:    Talks on phone: Not on file    Gets together: Not on file    Attends religious service: Not on file    Active member of club or organization: Not on file    Attends meetings of clubs or organizations: Not on file    Relationship status: Not on file  . Intimate partner violence:    Fear of current or ex partner: Not on file    Emotionally abused: Not on file    Physically abused: Not on file    Forced sexual activity: Not on file  Other Topics Concern  . Not on file  Social History Narrative  . Not on file    Past Surgical History:  Procedure Laterality Date  . APPENDECTOMY    . BACK SURGERY  08/2016  . BREAST SURGERY     Biopsy  . CAROTID ENDARTERECTOMY Bilateral   . CHOLECYSTECTOMY    . TONSILLECTOMY AND ADENOIDECTOMY      Family History  Problem Relation Age of Onset  . Heart disease Mother   . Hyperlipidemia Mother   . Hypertension Mother   .  Alcohol abuse Father   . COPD Father   . Heart attack Father   . Arthritis Sister   . Hypertension Sister   . Cancer Sister   . COPD Sister   . Heart attack Sister   . Heart disease Sister   . Hypertension Sister     Allergies  Allergen Reactions  . Codeine Rash  . Penicillins Rash    Current Outpatient Medications on File Prior to Visit  Medication Sig Dispense Refill  . aspirin 81 MG chewable tablet Chew 81 mg by mouth daily.    Marland Kitchen atorvastatin (LIPITOR) 80 MG tablet Take 1 tablet by mouth at bedtime for cholesterol. 90 tablet 3  . gabapentin (NEURONTIN) 100 MG capsule Take 1 capsule (100 mg total) by mouth 3 (three) times daily. 270 capsule 0  . gabapentin (NEURONTIN) 100 MG capsule TAKE 1 CAPSULE BY MOUTH THREE TIMES A DAY 90 capsule 0  . isosorbide mononitrate (IMDUR) 30 MG 24 hr tablet Take 1 tablet (30 mg total) by mouth daily. 90 tablet 3  . levothyroxine (SYNTHROID, LEVOTHROID) 25 MCG tablet Take 1 tablet by  mouth every morning on an empty stomach with water. No food or medications for 30 minutes. 90 tablet 3  . losartan (COZAAR) 100 MG tablet Take 1 tablet by mouth once daily for blood pressure. 90 tablet 3  . metoprolol tartrate (LOPRESSOR) 50 MG tablet Take 1 tablet (50 mg total) by mouth 2 (two) times daily. 180 tablet 3  . nitroGLYCERIN (NITROSTAT) 0.3 MG SL tablet Place 0.3 mg under the tongue every 5 (five) minutes as needed.    Marland Kitchen spironolactone (ALDACTONE) 25 MG tablet Take 1 tablet by mouth once daily for blood pressure and heart. 90 tablet 3   No current facility-administered medications on file prior to visit.     BP 120/66   Pulse (!) 56   Temp 98 F (36.7 C) (Oral)   Ht 5\' 1"  (1.549 m)   Wt 191 lb 12 oz (87 kg)   SpO2 96%   BMI 36.23 kg/m    Objective:   Physical Exam  Constitutional: She appears well-nourished.  Neck: Neck supple.  Cardiovascular: Normal rate.  Respiratory: Effort normal.  Musculoskeletal:       Right shoulder: She exhibits decreased range of motion and pain. She exhibits no tenderness, no bony tenderness and no deformity.  Decrease in ROM with lateral abduction, posterior abduction, forward abduction. 4/5 strength to right upper extremity on exam due to discomfort.  Skin: Skin is warm and dry. No erythema.           Assessment & Plan:

## 2018-04-19 ENCOUNTER — Other Ambulatory Visit: Payer: Self-pay | Admitting: Podiatry

## 2018-05-13 ENCOUNTER — Other Ambulatory Visit: Payer: Self-pay | Admitting: Podiatry

## 2018-05-18 ENCOUNTER — Other Ambulatory Visit: Payer: Self-pay | Admitting: Cardiovascular Disease

## 2018-07-03 ENCOUNTER — Telehealth: Payer: Self-pay | Admitting: Cardiovascular Disease

## 2018-07-03 MED ORDER — ISOSORBIDE MONONITRATE ER 30 MG PO TB24: 30.0000 mg | ORAL_TABLET | Freq: Two times a day (BID) | ORAL | 3 refills | Status: DC

## 2018-07-03 NOTE — Telephone Encounter (Signed)
I left a message for the patient to call. 

## 2018-07-03 NOTE — Telephone Encounter (Signed)
I spoke with the patient.  She states that she has been having episodes of chest pain ~ 2-3 nights/ week for the last month. This typically occurs right after she lays down at night. She will get up and take a NTG and this will relieve her symptoms.  The patient states that she had ~ 5 stents put in 10 years ago. Her symptoms are somewhat similar to her prior stents being placed. She does confirm occasional dizziness at home. She does not check her BP/ HR. No reports of tachycardia/ palpitations.   Her last cath was 07/2016:  Cardiac catheterization January 2018 moderate to severe 60-70% disease proximal second OM vessel, prior stent in the OM 60% distal edge restenosis, moderate diffuse calcification, small vessels throughout  The patient currently takes imdur 30 mg once daily.  I have reviewed the above with Ignacia Bayley, NP. Orders received to increase imdur to 30 mg BID and follow up in 1 week.  The patient has been notified of the above recommendations and is agreeable.  She states she has enough imdur to double up for the next week. She will see Ignacia Bayley, NP on 07/09/18 at 10 am.

## 2018-07-03 NOTE — Telephone Encounter (Signed)
Pt c/o medication issue:  1. Name of Medication: isosorbide mononitrate   2. How are you currently taking this medication (dosage and times per day)? 30 MG 1 tablet daily  3. Are you having a reaction (difficulty breathing--STAT)? Chest discomfort, tightness  4. What is your medication issue? Patient states that at night when getting ready for bed she notices the chest discomfort and has to get up and take a nitro.  It is not everyday, maybe 2 to 3 nights a week for about a month now.

## 2018-07-09 ENCOUNTER — Ambulatory Visit (INDEPENDENT_AMBULATORY_CARE_PROVIDER_SITE_OTHER): Payer: Medicare Other

## 2018-07-09 ENCOUNTER — Encounter: Payer: Self-pay | Admitting: Nurse Practitioner

## 2018-07-09 ENCOUNTER — Ambulatory Visit: Payer: Medicare Other | Admitting: Nurse Practitioner

## 2018-07-09 VITALS — BP 130/60 | HR 53 | Ht 61.0 in | Wt 195.0 lb

## 2018-07-09 DIAGNOSIS — I350 Nonrheumatic aortic (valve) stenosis: Secondary | ICD-10-CM

## 2018-07-09 DIAGNOSIS — I10 Essential (primary) hypertension: Secondary | ICD-10-CM | POA: Diagnosis not present

## 2018-07-09 DIAGNOSIS — R002 Palpitations: Secondary | ICD-10-CM

## 2018-07-09 DIAGNOSIS — I208 Other forms of angina pectoris: Secondary | ICD-10-CM | POA: Diagnosis not present

## 2018-07-09 DIAGNOSIS — I25118 Atherosclerotic heart disease of native coronary artery with other forms of angina pectoris: Secondary | ICD-10-CM | POA: Diagnosis not present

## 2018-07-09 DIAGNOSIS — E782 Mixed hyperlipidemia: Secondary | ICD-10-CM

## 2018-07-09 MED ORDER — ISOSORBIDE MONONITRATE ER 30 MG PO TB24: 30.0000 mg | ORAL_TABLET | Freq: Two times a day (BID) | ORAL | 3 refills | Status: DC

## 2018-07-09 MED ORDER — SPIRONOLACTONE 25 MG PO TABS: ORAL_TABLET | ORAL | 3 refills | Status: DC

## 2018-07-09 NOTE — Progress Notes (Signed)
Office Visit    Patient Name: Victoria Burns Date of Encounter: 07/09/2018  Primary Care Provider:  Pleas Koch, NP Primary Cardiologist:  Ida Rogue, MD  Chief Complaint    77 year old female with a history of coronary artery disease status post prior stenting, carotid arterial disease, hypertension, hyperlipidemia, moderate to severe aortic stenosis, right bundle branch block, arthritis, TIA, and hypothyroidism, who presents for follow-up of stable angina.  Past Medical History    Past Medical History:  Diagnosis Date  . Arthritis   . CAD (coronary artery disease)    a. s/p prior stenting; b. 05/2017 Cath ([facility] [place]): LAD mild irregs, LCX 29m, OM2 40 ISR, RCA dominant, RPLV 50; c. 02/2018 MV: EF 64%. No ischemia/scar.  . Carotid arterial disease ([place])    a. 02/2018 Carotid U/S: 1039% bilat ICA stenosis. F/u prn.  . Chronic back pain   . Hyperlipidemia   . Hypertension   . Hypothyroidism   . Juxtafoveal telangietasis of both eyes   . Moderate to severe aortic stenosis    a. 04/2016 Echo: Mod AS; b. 02/2018 Echo: EF 55-60%, no rwma, Gr1 DD. Mod to sev AS. Mod AI. Mean grad 48mmHg. Valve area (VTI): 0.71 cm^2.  Marland Kitchen RBBB   . TIA (transient ischemic attack)    Past Surgical History:  Procedure Laterality Date  . APPENDECTOMY    . BACK SURGERY  08/2016  . BREAST SURGERY     Biopsy  . CAROTID ENDARTERECTOMY Bilateral   . CHOLECYSTECTOMY    . TONSILLECTOMY AND ADENOIDECTOMY      Allergies  Allergies  Allergen Reactions  . Codeine Rash  . Penicillins Rash    History of Present Illness    77 year old female with the above past medical history including coronary artery disease status post prior obtuse marginal stenting, hypertension, hyperlipidemia, moderate to severe aortic stenosis, right bundle branch block, TIA, arthritis, carotid arterial disease, and hypothyroidism.  Her last diagnostic catheterization was performed in [place] in  November 2018 revealing moderate nonobstructive CAD.  She has been medically managed since.  She was last seen in cardiology clinic in November, at which time she reported nitrate responsive chest pain.  Stress testing was undertaken and was low risk and nonischemic.  Echocardiogram showed moderate to severe aortic stenosis with normal LV function..  Carotid ultrasound was also performed and showed 1 to 39% bilateral carotid disease.  She says that ever since then, she has been experiencing intermittent substernal chest discomfort, typically occurring when she gets ready for bed at night.  Made about once a week she has episodes during the day and with activity.  Symptoms will last between 5 and 15 minutes and resolve with rest.  She recently called into the office to report ongoing symptoms and we had her increase her isosorbide mononitrate to 30 mg twice daily.  Since then, she has had complete resolution of chest discomfort.  She is chronic dyspnea on exertion which has not changed in many years.  She denies PND, orthopnea, dizziness, syncope, edema, or early satiety.  She does report that she has intermittent fluttering in her chest a few times a week lasting up to a minute, and resolving spontaneously.  She says she has had it for many years and even thinks that she might of worn a monitor when she lived in [place] many years ago.  She does not know what the monitor showed.  She does not have any associated symptoms other  than noticing fluttering in her chest.  Home Medications    Prior to Admission medications   Medication Sig Start Date End Date Taking? Authorizing Provider  aspirin 81 MG chewable tablet Chew 81 mg by mouth daily.   Yes [provider]  atorvastatin (LIPITOR) 80 MG tablet Take 1 tablet by mouth at bedtime for cholesterol. 01/23/18  Yes Pleas Koch, NP  gabapentin (NEURONTIN) 100 MG capsule TAKE 1 CAPSULE BY MOUTH 3  TIMES DAILY Patient taking differently: Take 100 mg  by mouth 2 (two) times daily.  05/13/18  Yes Gardiner Barefoot, DPM  isosorbide mononitrate (IMDUR) 30 MG 24 hr tablet Take 1 tablet (30 mg total) by mouth 2 (two) times daily. 07/03/18  Yes Theora Gianotti, NP  levothyroxine (SYNTHROID, LEVOTHROID) 25 MCG tablet Take 1 tablet by mouth every morning on an empty stomach with water. No food or medications for 30 minutes. 01/30/18  Yes Pleas Koch, NP  losartan (COZAAR) 100 MG tablet Take 1 tablet by mouth once daily for blood pressure. 01/23/18  Yes Pleas Koch, NP  metoprolol tartrate (LOPRESSOR) 50 MG tablet TAKE 1 TABLET BY MOUTH TWO  TIMES DAILY 05/20/18  Yes Gollan, Kathlene November, MD  naproxen (NAPROSYN) 500 MG tablet Take 1 tablet (500 mg total) by mouth 2 (two) times daily with a meal. 04/08/18  Yes Pleas Koch, NP  nitroGLYCERIN (NITROSTAT) 0.3 MG SL tablet Place 0.3 mg under the tongue every 5 (five) minutes as needed.   Yes [provider]  spironolactone (ALDACTONE) 25 MG tablet Take 1 tablet by mouth once daily for blood pressure and heart. 01/23/18  Yes Pleas Koch, NP    Review of Systems    Exertional angina that has improved since titrating nitrate therapy.  Chronic dyspnea exertion.  Chronic fluttering in her chest a few times a week.  She denies PND, orthopnea, dizziness, syncope, edema, or early satiety.  All other systems reviewed and are otherwise negative except as noted above.  Physical Exam    VS:  BP 130/60 (BP Location: Left Arm, Patient Position: Sitting, Cuff Size: Normal)   Pulse (!) 53   Ht 5\' 1"  (1.549 m)   Wt 195 lb (88.5 kg)   BMI 36.84 kg/m  , BMI Body mass index is 36.84 kg/m. GEN: Well nourished, well developed, in no acute distress. HEENT: normal. Neck: Supple, no JVD or masses.  Radiated aortic murmur to the left carotid area. Cardiac: RRR, 3/6 systolic ejection murmur loudest at the upper sternal borders but heard throughout.  This radiates to the left carotid.  Rubs,  or gallops. No clubbing, cyanosis, edema.  Radials/PT 2+ and equal bilaterally.  Respiratory:  Respirations regular and unlabored, clear to auscultation bilaterally. GI: Soft, nontender, nondistended, BS + x 4. MS: no deformity or atrophy. Skin: warm and dry, no rash. Neuro:  Strength and sensation are intact. Psych: Normal affect.  Accessory Clinical Findings    ECG personally reviewed by me today -sinus bradycardia, 53, right bundle branch block- no acute changes.  Assessment & Plan    1.  Stable angina/coronary artery disease: Patient reports stable angina dating back to earlier this year which has been nitrate responsive.  She had a low risk stress test in August.  She also has moderate to severe aortic stenosis by echo in August.  Recently she contacted the office related to discomfort that was occurring when getting ready for bed at night.  We doubled her isosorbide mononitrate  and since then, she has had no recurrence of chest pain.  She is chronic, stable dyspnea on exertion.  I will continue medical therapy with aspirin, statin, nitrate, ARB, and beta-blocker.  We discussed that if she has recurrent symptoms we would have a low threshold to pursue right and left heart cardiac catheterization in the setting of moderate nonobstructive CAD on catheterization in November 2018, along with progressing aortic stenosis.  She understands this and will contact us if symptoms recur.  2.  Moderate to severe aortic stenosis: Plan to follow-up echocardiogram in August 2020.  As above, if she develops recurrent exertional chest discomfort, would have a low threshold to pursue right and left heart cardiac catheterization.  3.  Essential hypertension: Stable on beta-blocker, ARB, nitrate, and Spironolactone therapy.  4.  Hyperlipidemia: LDL 57 in July.  LFTs within normal limits in January.  Continue statin therapy.  5.  Palpitations: Patient reports a long history of intermittent heart fluttering  occurring multiple times a week lasting about a minute, and resolving spontaneously.  She thinks she wore a monitor many years ago, potentially as far back as the 35s or early 6s.  Symptoms are somewhat bothersome.  I will arrange for a Zio monitor to formally document here.  Continue beta-blocker.  6.  Disposition: Follow-up monitoring as above.  Follow-up in clinic in 1 to 2 months or sooner if necessary.   Murray Hodgkins, NP 07/09/2018, 1:36 PM

## 2018-07-09 NOTE — Patient Instructions (Signed)
Medication Instructions:  No changes If you need a refill on your cardiac medications before your next appointment, please call your pharmacy.   Lab work: None ordered  Testing/Procedures: Your physician has recommended that you wear a 14 day Zio event monitor. Event monitors are medical devices that record the heart's electrical activity. Doctors most often [place] these monitors to diagnose arrhythmias. Arrhythmias are problems with the speed or rhythm of the heartbeat. The monitor is a small, portable device. You can wear one while you do your normal daily activities. This is usually used to diagnose what is causing palpitations/syncope (passing out).  Follow-Up: At [facility], you and your health needs are our priority.  As part of our continuing mission to provide you with exceptional heart care, we have created designated Provider Care Teams.  These Care Teams include your primary Cardiologist (physician) and Advanced Practice Providers (APPs -  Physician Assistants and Nurse Practitioners) who all work together to provide you with the care you need, when you need it. You will need a follow up appointment in 3 months.  Please call our office 2 months in advance to schedule this appointment.  You may see Dr. Rockey Situ or one of the following Advanced Practice Providers on your designated Care Team:   Murray Hodgkins, NP Christell Faith, PA-C . Marrianne Mood, PA-C

## 2018-07-17 DIAGNOSIS — I480 Paroxysmal atrial fibrillation: Secondary | ICD-10-CM

## 2018-07-17 HISTORY — DX: Paroxysmal atrial fibrillation: I48.0

## 2018-07-25 DIAGNOSIS — S60222A Contusion of left hand, initial encounter: Secondary | ICD-10-CM | POA: Diagnosis not present

## 2018-07-26 ENCOUNTER — Ambulatory Visit: Payer: Medicare Other | Admitting: Primary Care

## 2018-07-29 DIAGNOSIS — R002 Palpitations: Secondary | ICD-10-CM | POA: Diagnosis not present

## 2018-08-02 ENCOUNTER — Telehealth: Payer: Self-pay | Admitting: Cardiovascular Disease

## 2018-08-02 NOTE — Telephone Encounter (Signed)
Patient calling to check on status of heart monitor results Please call to discuss

## 2018-08-02 NOTE — Telephone Encounter (Signed)
Spoke with patient and reviewed that provider has monitor report and once he reviews then we will call her with those results. She verbalized understanding with no further questions at this time.

## 2018-08-07 ENCOUNTER — Encounter: Payer: Self-pay | Admitting: Nurse Practitioner

## 2018-08-08 ENCOUNTER — Telehealth: Payer: Self-pay | Admitting: *Deleted

## 2018-08-08 NOTE — Telephone Encounter (Signed)
Results called to pt. Pt verbalized understanding. She reports that her current medications are controlling her palpitations and they are not bothersome.  She denies feeling anything related to the time of the pause. She reports occasional dizziness 1-2 times a week. Says it last a few minutes sometimes. It occurs at random and not necessarily when changing positions. She said she did have dizziness one evening while wearing the monitor.  Advised I will let provider know. She is aware she may see EP.

## 2018-08-08 NOTE — Telephone Encounter (Signed)
-----   Message from Theora Gianotti, NP sent at 08/07/2018  7:36 AM EST ----- 30 brief runs of fast heart rates stemming from the top portion of the heart.  The longest was only 13 beats and very likely accounts for the palpitations that she's experienced over the years.  Interestingly, her triggered events were not associated with any significant arrhythmias.  Management for these short bursts/palpitations, would simply be to continue her metoprolol or try another beta blocker if symptoms are bothersome enough.  She did have a pause of 3.8 second pause occurring on 1/2 just after noon.  No symptoms reported.  Is it possible that she was napping?  If so, a sleep study would be in order.  Otw, she should be on the lookout for symptoms of lightheadedness/near fainting, @ which point I would refer to EP.

## 2018-08-19 MED ORDER — METOPROLOL TARTRATE 50 MG PO TABS: 25.0000 mg | ORAL_TABLET | Freq: Two times a day (BID) | ORAL | 3 refills | Status: DC

## 2018-08-19 NOTE — Telephone Encounter (Signed)
Patient verbalized understanding to decrease metoprolol to 25 mg (1/2 tablet) by mouth two times a day. Med list updated. States she's "feeling much better."

## 2018-08-19 NOTE — Telephone Encounter (Signed)
Please have her reduce metoprolol to 25mg  bid (1/2 of 50mg  tabs that she has).  F/u with TG as planned.

## 2018-08-19 NOTE — Addendum Note (Signed)
Addended by: Vanessa Ralphs on: 08/19/2018 12:09 PM   Modules accepted: Orders

## 2018-09-20 DIAGNOSIS — T161XXA Foreign body in right ear, initial encounter: Secondary | ICD-10-CM | POA: Diagnosis not present

## 2018-09-20 DIAGNOSIS — Z87891 Personal history of nicotine dependence: Secondary | ICD-10-CM | POA: Diagnosis not present

## 2018-10-03 ENCOUNTER — Telehealth: Payer: Self-pay

## 2018-10-03 NOTE — Telephone Encounter (Signed)
Given pt hx and still having dizziness/ presyncope sx after pauses noted in the monitor report, pt willing to come in to be seen. Pt made aware of current visitor policy and is cooperative with clinic regulations.   COVID-19 Pre-Screening:  1. Have you been in contact with someone who was sick?  no 2. Do you have any of the following symptoms (cough, fever, muscle pain, vomiting, diarrhea, weakness abdominal pain, rash, red eye, bruising or bleeding, joint pain, severe headache)?  no 3. Have you travelled internationally or out of state in the last month?  no  4. Do you need any refills at this time?  n/a  Patient aware of the following: Please be advised that we require, no one but yourself to come to appointment. If necessary, only one visitor may come with you into the building. They will also be asked the same screening questions. You will be contacted at a later time to reschedule. However, this will depend on ongoing evaluation of the Covid-19 situation.  Please call us if any new questions or concerns arise. We are here for advice.

## 2018-10-07 ENCOUNTER — Encounter: Payer: Self-pay | Admitting: Cardiovascular Disease

## 2018-10-07 ENCOUNTER — Ambulatory Visit: Payer: Medicare Other | Admitting: Cardiovascular Disease

## 2018-10-07 ENCOUNTER — Other Ambulatory Visit: Payer: Self-pay

## 2018-10-07 VITALS — BP 132/70 | HR 55 | Ht 62.0 in | Wt 192.0 lb

## 2018-10-07 DIAGNOSIS — I208 Other forms of angina pectoris: Secondary | ICD-10-CM

## 2018-10-07 DIAGNOSIS — I6523 Occlusion and stenosis of bilateral carotid arteries: Secondary | ICD-10-CM | POA: Diagnosis not present

## 2018-10-07 DIAGNOSIS — I739 Peripheral vascular disease, unspecified: Secondary | ICD-10-CM

## 2018-10-07 DIAGNOSIS — I10 Essential (primary) hypertension: Secondary | ICD-10-CM | POA: Diagnosis not present

## 2018-10-07 DIAGNOSIS — I350 Nonrheumatic aortic (valve) stenosis: Secondary | ICD-10-CM | POA: Diagnosis not present

## 2018-10-07 DIAGNOSIS — I25118 Atherosclerotic heart disease of native coronary artery with other forms of angina pectoris: Secondary | ICD-10-CM | POA: Diagnosis not present

## 2018-10-07 MED ORDER — METOPROLOL TARTRATE 25 MG PO TABS: 12.5000 mg | ORAL_TABLET | Freq: Two times a day (BID) | ORAL | 3 refills | Status: DC

## 2018-10-07 NOTE — Patient Instructions (Addendum)
Medication Instructions:  Please stop the spironolactone  Decrease the metoprolol down to 25 mg- take 1/2 tablet (12.5 mg)  twice a day  Monitor blood pressure standing and sitting  If you need a refill on your cardiac medications before your next appointment, please call your pharmacy.    Lab work: No new labs needed   If you have labs (blood work) drawn today and your tests are completely normal, you will receive your results only by: Marland Kitchen MyChart Message (if you have MyChart) OR . A paper copy in the mail If you have any lab test that is abnormal or we need to change your treatment, we will call you to review the results.   Testing/Procedures: Echocardiogram in Aug 2020 for aortic valve stenosis  Your physician has requested that you have an echocardiogram. Echocardiography is a painless test that uses sound waves to create images of your heart. It provides your doctor with information about the size and shape of your heart and how well your heart's chambers and valves are working. This procedure takes approximately one hour. There are no restrictions for this procedure.   Follow-Up: At [facility], you and your health needs are our priority.  As part of our continuing mission to provide you with exceptional heart care, we have created designated Provider Care Teams.  These Care Teams include your primary Cardiologist (physician) and Advanced Practice Providers (APPs -  Physician Assistants and Nurse Practitioners) who all work together to provide you with the care you need, when you need it.  . You will need a follow up appointment evisit in 2 weeks (E-visit)  . Providers on your designated Care Team:   . Murray Hodgkins, NP . Christell Faith, PA-C . Marrianne Mood, PA-C  Any Other Special Instructions Will Be Listed Below (If Applicable).  For educational health videos Log in to : www.[email or web address] Or : [email or web address], password : [email or web address]    Echocardiogram An echocardiogram  is a procedure that uses painless sound waves (ultrasound) to produce an image of the heart. Images from an echocardiogram can provide important information about:  Signs of coronary artery disease (CAD).  Aneurysm detection. An aneurysm is a weak or damaged part of an artery wall that bulges out from the normal force of blood pumping through the body.  Heart size and shape. Changes in the size or shape of the heart can be associated with certain conditions, including heart failure, aneurysm, and CAD.  Heart muscle function.  Heart valve function.  Signs of a past heart attack.  Fluid buildup around the heart.  Thickening of the heart muscle.  A tumor or infectious growth around the heart valves. Tell a health care provider about:  Any allergies you have.  All medicines you are taking, including vitamins, herbs, eye drops, creams, and over-the-counter medicines.  Any blood disorders you have.  Any surgeries you have had.  Any medical conditions you have.  Whether you are pregnant or may be pregnant. What are the risks? Generally, this is a safe procedure. However, problems may occur, including:  Allergic reaction to dye (contrast) that may be used during the procedure. What happens before the procedure? No specific preparation is needed. You may eat and drink normally. What happens during the procedure?   An IV tube may be inserted into one of your veins.  You may receive contrast through this tube. A contrast is an injection that improves the quality of the pictures from your heart.  A gel will  be applied to your chest.  A wand-like tool (transducer) will be moved over your chest. The gel will help to transmit the sound waves from the transducer.  The sound waves will harmlessly bounce off of your heart to allow the heart images to be captured in real-time motion. The images will be recorded on a computer. The procedure may vary among health care providers and  hospitals. What happens after the procedure?  You may return to your normal, everyday life, including diet, activities, and medicines, unless your health care provider tells you not to do that. Summary  An echocardiogram is a procedure that uses painless sound waves (ultrasound) to produce an image of the heart.  Images from an echocardiogram can provide important information about the size and shape of your heart, heart muscle function, heart valve function, and fluid buildup around your heart.  You do not need to do anything to prepare before this procedure. You may eat and drink normally.  After the echocardiogram is completed, you may return to your normal, everyday life, unless your health care provider tells you not to do that. This information is not intended to replace advice given to you by your health care provider. Make sure you discuss any questions you have with your health care provider. Document Released: 06/30/2000 Document Revised: 08/05/2016 Document Reviewed: 08/05/2016 Elsevier Interactive Patient Education  2019 [employer].

## 2018-10-07 NOTE — Progress Notes (Signed)
Cardiology Office Note  Date:  10/07/2018   ID:  Victoria Burns, DOB 1940-12-08, MRN [id number]  PCP:  Pleas Koch, NP   Chief Complaint  Patient presents with  . other    3 month follow up and discuss Zio monitor results. Meds reviewed by the pt. verbally. Pt. c/o shortness of breath, chest pain and occas. dizziness.     HPI:  Victoria Burns is a 78 year old woman with past medical history of Hypothyroidism Hyperlipidemia Coronary artery disease, history of stenting x1 Hypertension Chronic back pain PAD, bilateral carotid endarterectomy, 15 yr ago 1 year ago Former smoker Moderate to severe AS in 02/2018 Spinal surgery Prior sleep study AHI 13 events per hour and the RDI was 17 events per hour performed May 2016 Presenting for f/u of her  coronary artery disease  Previous studies reviewed with her in detail diagnostic catheterization was performed in [place] in November 2018 revealing moderate nonobstructive CAD.  Low risk stress test 02/2018  On today's visit she reports having rare episodes of chest pain, sometimes takes sublingual nitroglycerin  Occasional lightheadedness with standing Orthostatics done in the office today: Supine 170/70 heart rate 52 Sitting 151/64 rate 52 Standing 131/60 rate 57 Standing 3 minutes 117/63 rate 57, somewhat dizzy  We went through all of her medications with her which include losartan 100 metoprolol 25 twice daily spironolactone 25 isosorbide 30 twice daily  On her last clinic visit isosorbide was increased up to 30 twice daily Chest pain resolved  Previously reported fluttering Event monitor was reviewed with her in detail She denies having significant palpitations or tachycardia Monitor showing rare SVT, up to 13 beats, 1 pause, One VT 4 beats triggered events were not associated with any significant arrhythmias.   No regular exercise program but does try to stay active Denies any leg swelling  Last cardiac  catheterization May 17, 2017 [facility] heart clinic  stable coronary disease, moderate aortic valve stenosis LAD with mild luminal irregularities, left circumflex 30% mid disease, stent in OM 2 with 40% mid in-stent stenosis Right dominant vessel 50% disease  EKG personally reviewed by myself on todays visit Shows normal sinus rhythm rate 55 bpm right bundle branch block  Other past medical history reviewed Carotid ultrasound October 2017 noting 60-79% stenosis on the left  Echocardiogram October 2017 moderate aortic valve stenosis mean gradient 26 mmHg peak gradient 54 mmHg peak velocity 3.7 m/s  Cardiac catheterization January 2018 moderate to severe 60-70% disease proximal second OM vessel, prior stent in the OM 60% distal edge restenosis, moderate diffuse calcification, small vessels throughout  PMH:   has a past medical history of Arthritis, CAD (coronary artery disease), Carotid arterial disease ([place]), Chronic back pain, Hyperlipidemia, Hypertension, Hypothyroidism, Juxtafoveal telangietasis of both eyes, Moderate to severe aortic stenosis, RBBB, and TIA (transient ischemic attack).  PSH:    Past Surgical History:  Procedure Laterality Date  . APPENDECTOMY    . BACK SURGERY  08/2016  . BREAST SURGERY     Biopsy  . CAROTID ENDARTERECTOMY Bilateral   . CHOLECYSTECTOMY    . TONSILLECTOMY AND ADENOIDECTOMY      Current Outpatient Medications  Medication Sig Dispense Refill  . aspirin 81 MG chewable tablet Chew 81 mg by mouth daily.    Marland Kitchen atorvastatin (LIPITOR) 80 MG tablet Take 1 tablet by mouth at bedtime for cholesterol. 90 tablet 3  . isosorbide mononitrate (IMDUR) 30 MG 24 hr tablet Take 1 tablet (30 mg total) by mouth 2 (  two) times daily. 180 tablet 3  . levothyroxine (SYNTHROID, LEVOTHROID) 25 MCG tablet Take 1 tablet by mouth every morning on an empty stomach with water. No food or medications for 30 minutes. 90 tablet 3  . losartan (COZAAR) 100 MG tablet Take 1 tablet  by mouth once daily for blood pressure. 90 tablet 3  . metoprolol tartrate (LOPRESSOR) 50 MG tablet Take 0.5 tablets (25 mg total) by mouth 2 (two) times daily. 180 tablet 3  . naproxen (NAPROSYN) 500 MG tablet Take 1 tablet (500 mg total) by mouth 2 (two) times daily with a meal. 14 tablet 0  . nitroGLYCERIN (NITROSTAT) 0.3 MG SL tablet Place 0.3 mg under the tongue every 5 (five) minutes as needed.    Marland Kitchen spironolactone (ALDACTONE) 25 MG tablet Take 1 tablet by mouth once daily for blood pressure and heart. 90 tablet 3   No current facility-administered medications for this visit.      Allergies:   Codeine and Penicillins   Social History:  The patient  reports that she has quit smoking. She has never used smokeless tobacco. She reports that she does not drink alcohol or use drugs.   Family History:   family history includes Alcohol abuse in her father; Arthritis in her sister; COPD in her father and sister; Cancer in her sister; Heart attack in her father and sister; Heart disease in her mother and sister; Hyperlipidemia in her mother; Hypertension in her mother, sister, and sister.    Review of Systems: Review of Systems  Constitutional: Negative.   Respiratory: Negative.   Cardiovascular: Positive for chest pain.  Gastrointestinal: Negative.   Musculoskeletal: Negative.   Neurological: Positive for dizziness.  All other systems reviewed and are negative.   PHYSICAL EXAM: VS:  BP 132/70 (BP Location: Left Arm, Patient Position: Sitting, Cuff Size: Normal)   Pulse (!) 55   Ht 5\' 2"  (1.575 m)   Wt 192 lb (87.1 kg)   BMI 35.12 kg/m  , BMI Body mass index is 35.12 kg/m. Constitutional:  oriented to person, place, and time. No distress.  HENT:  Head: Grossly normal Eyes:  no discharge. No scleral icterus.  Neck: No JVD, no carotid bruits  Cardiovascular: Regular rate and rhythm, no murmurs appreciated Pulmonary/Chest: Clear to auscultation bilaterally, no wheezes or  rails Abdominal: Soft.  no distension.  no tenderness.  Musculoskeletal: Normal range of motion Neurological:  normal muscle tone. Coordination normal. No atrophy Skin: Skin warm and dry Psychiatric: normal affect, pleasant  Recent Labs: 01/23/2018: TSH 1.76 02/13/2018: BUN 14; Creatinine, Ser 0.92; Potassium 4.7; Sodium 139    Lipid Panel Lab Results  Component Value Date   CHOL 123 01/23/2018   HDL 48.10 01/23/2018   LDLCALC 57 01/23/2018   TRIG 90.0 01/23/2018      Wt Readings from Last 3 Encounters:  10/07/18 192 lb (87.1 kg)  07/09/18 195 lb (88.5 kg)  04/08/18 191 lb 12 oz (87 kg)     ASSESSMENT AND PLAN:  Essential hypertension Very orthostatic on today's visit with symptoms of dizziness on standing especially after 3 minutes Symptoms likely exacerbated by moderate to severe aortic valve stenosis Recommend she hold her spironolactone and decrease metoprolol down to 12.5 twice daily We will call her in 2 weeks time for assessment on blood pressures and dizziness episodes She will monitor blood pressures, orthostatics at home  Coronary artery disease of native artery of native heart with stable angina pectoris ([place]) - Plan: EKG 12-Lead Stable  angina symptoms, occasionally takes sublingual nitro Symptoms improved on isosorbide twice daily Orthostatics noted as above  cardiac catheterization  May 17, 2017, moderate nonobstructive disease,  No further testing at this time  Mixed hyperlipidemia Lipids at goal, no medication changes made  PAD (peripheral artery disease) ([place]) History of Bilateral carotid endarterectomy Prior carotid ultrasound showing less than 39% disease bilaterally  Disposition:   F/U   visit in 2 weeks time to check blood pressures, orthostatics   Total encounter time more than 25 minutes  Greater than 50% was spent in counseling and coordination of care with the patient    No orders of the defined types were placed in this  encounter.    Signed, Esmond Plants, M.D., Ph.D. 10/07/2018  [place], [place]

## 2018-10-10 ENCOUNTER — Telehealth: Payer: Self-pay | Admitting: Cardiovascular Disease

## 2018-10-10 NOTE — Telephone Encounter (Signed)
TELEPHONE CALL NOTE  MCKAYLIN BASTIEN has been deemed a candidate for a follow-up tele-health visit to limit community exposure during the Covid-19 pandemic. I spoke with the patient via phone to ensure availability of phone/video source, confirm preferred email & phone number, and discuss instructions and expectations.  I reminded AYLEAH HOFMEISTER to be prepared with any vital sign and/or heart rhythm information that could potentially be obtained via home monitoring, at the time of her visit. I reminded AVIE CHECO to expect an e-mail containing a link for their video-based visit approximately 15 minutes before her visit, or alternatively, a phone call at the time of her visit if her visit is planned to be a phone encounter.  STAFF MUST READ CONSENT VERBATIM TO PATIENT BELOW - Did the patient verbally consent to treatment as below? yes  Clarisse Gouge 10/10/2018 9:07 AM  DOWNLOADING THE SOFTWARE (If applicable)  Download the [employer] app to enable video and telephone visits with your [facility] Provider.   Instructions for downloading [employer] Web[employer]: - Go to https://www.webex.com/downloads.html and follow the instructions - If you have technical difficulties with downloading WebEx, please call WebEx at [phone]. - Once the app is downloaded (can be done on either mobile or desktop computer), go to Settings in the upper left hand corner.  Be sure that camera and audio are enabled.  - You will receive an email message with a link to the meeting with a time to join for your tele-health visit.  - Please download the app and have settings configured prior to the appointment time.    CONSENT FOR TELE-HEALTH VISIT - PLEASE REVIEW  I hereby voluntarily request, consent and authorize [facility] and its employed or contracted physicians, physician assistants, nurse practitioners or other licensed health care professionals (the Practitioner), to provide me with telemedicine  health care services (the Services") as deemed necessary by the treating Practitioner. I acknowledge and consent to receive the Services by the Practitioner via telemedicine. I understand that the telemedicine visit will involve communicating with the Practitioner through live audiovisual communication technology and the disclosure of certain medical information by electronic transmission. I acknowledge that I have been given the opportunity to request an in-person assessment or other available alternative prior to the telemedicine visit and am voluntarily participating in the telemedicine visit.  I understand that I have the right to withhold or withdraw my consent to the use of telemedicine in the course of my care at any time, without affecting my right to future care or treatment, and that the Practitioner or I may terminate the telemedicine visit at any time. I understand that I have the right to inspect all information obtained and/or recorded in the course of the telemedicine visit and may receive copies of available information for a reasonable fee.  I understand that some of the potential risks of receiving the Services via telemedicine include:   Delay or interruption in medical evaluation due to technological equipment failure or disruption;  Information transmitted may not be sufficient (e.g. poor resolution of images) to allow for appropriate medical decision making by the Practitioner; and/or   In rare instances, security protocols could fail, causing a breach of personal health information.  Furthermore, I acknowledge that it is my responsibility to provide information about my medical history, conditions and care that is complete and accurate to the best of my ability. I acknowledge that Practitioner's advice, recommendations, and/or decision may be based on factors not within their  control, such as incomplete or inaccurate data provided by me or distortions of diagnostic images or  specimens that may result from electronic transmissions. I understand that the practice of medicine is not an exact science and that Practitioner makes no warranties or guarantees regarding treatment outcomes. I acknowledge that I will receive a copy of this consent concurrently upon execution via email to the email address I last provided but may also request a printed copy by calling the office of [facility].    I understand that my insurance will be billed for this visit.   I have read or had this consent read to me.  I understand the contents of this consent, which adequately explains the benefits and risks of the Services being provided via telemedicine.   I have been provided ample opportunity to ask questions regarding this consent and the Services and have had my questions answered to my satisfaction.  I give my informed consent for the services to be provided through the use of telemedicine in my medical care  By participating in this telemedicine visit I agree to the above.

## 2018-10-21 ENCOUNTER — Other Ambulatory Visit: Payer: Self-pay

## 2018-10-21 ENCOUNTER — Telehealth (INDEPENDENT_AMBULATORY_CARE_PROVIDER_SITE_OTHER): Payer: Medicare Other | Admitting: Cardiovascular Disease

## 2018-10-21 ENCOUNTER — Ambulatory Visit: Payer: Medicare Other | Admitting: Nurse Practitioner

## 2018-10-21 DIAGNOSIS — I359 Nonrheumatic aortic valve disorder, unspecified: Secondary | ICD-10-CM

## 2018-10-21 DIAGNOSIS — I25118 Atherosclerotic heart disease of native coronary artery with other forms of angina pectoris: Secondary | ICD-10-CM | POA: Diagnosis not present

## 2018-10-21 DIAGNOSIS — I350 Nonrheumatic aortic (valve) stenosis: Secondary | ICD-10-CM

## 2018-10-21 DIAGNOSIS — E782 Mixed hyperlipidemia: Secondary | ICD-10-CM | POA: Diagnosis not present

## 2018-10-21 DIAGNOSIS — I6523 Occlusion and stenosis of bilateral carotid arteries: Secondary | ICD-10-CM

## 2018-10-21 DIAGNOSIS — I739 Peripheral vascular disease, unspecified: Secondary | ICD-10-CM

## 2018-10-21 DIAGNOSIS — I10 Essential (primary) hypertension: Secondary | ICD-10-CM | POA: Diagnosis not present

## 2018-10-21 NOTE — Progress Notes (Signed)
Telephone Visit     Evaluation Performed:  Follow-up visit  This visit type was conducted due to national recommendations for restrictions regarding the COVID-19 Pandemic (e.g. social distancing).  This format is felt to be most appropriate for this patient at this time.  All issues noted in this document were discussed and addressed.  No physical exam was performed (except for noted visual exam findings with Telehealth visits).  See MyChart message from today for the patient's consent to telehealth for [name]  Date:  10/21/2018   ID:  Victoria Burns, DOB 10/07/1940, MRN [id number]  Patient Location:  [address] [place] [address]   Provider location:   [facility], [place] office  PCP:  Pleas Koch, NP  Cardiologist:  Patsy Baltimore  Chief Complaint: Dizziness/spinning    History of Present Illness:    Victoria Burns is a 78 y.o. female who presents via audio/video conferencing for a telehealth visit today.   The patient does not symptoms concerning for COVID-19 infection (fever, chills, cough, or new SHORTNESS OF BREATH).   Patient has a past medical history of Hypothyroidism Hyperlipidemia Coronary artery disease, history of stenting x1 Hypertension Chronic back pain PAD, bilateral carotid endarterectomy, 15 yr ago 1 year ago Former smoker Moderate to severe AS in 02/2018, mean gradient 30 mmHg, Velocity 3.7 m/s Spinal surgery Prior sleep study AHI 13 events per hour and the RDI was 17 events per hour performed May 2016 Presenting for f/u of her  coronary artery disease, aortic valve stenosis Programs to down 9 On her last clinic visit she was having orthostasis symptoms Last office visit 287 systolic supine down to 867 with standing after 3 minutes Not much variability in heart rate which was staying in the 50s  On her last clinic visit reported having some dizziness worse when she stood up Spironolactone held and  metoprolol decreased down to 12.5 twice daily Rare chest pain sometimes takes nitro Somewhat better on isosorbide twice a day for anginal symptoms  On discussion today she reports that she is Still dizzy, standing Some dizzy when laying down 2-3 times a week, <2 min when she lays down Feels like room is going in a circle  Blood pressure numbers checked Morning numbers before medication 153/68, sitting 133/78 standing  Evening numbers 137/75, sitting 110/70 standing  Sitting systolic 672 to 094B, morning and evening Occasional 096G systolic  Denies any chest pain episodes requiring nitro No near syncope or syncope  Previous echocardiogram results reviewed with her in detail  Prior CV studies:   The following studies were reviewed today:  diagnostic catheterization was performed in [place] in November 2018 revealing moderate nonobstructive CAD.  Low risk stress test 02/2018  Event monitor  Monitor showing rare SVT, up to 13 beats, 1 pause, One VT 4 beats triggered events were not associated with any significant arrhythmias.    Last cardiac catheterization May 17, 2017 [facility] heart clinic  stable coronary disease, moderate aortic valve stenosis LAD with mild luminal irregularities, left circumflex 30% mid disease, stent in OM 2 with 40% mid in-stent stenosis Right dominant vessel 50% disease  Carotid ultrasound October 2017 noting 60-79% stenosis on the left  Echocardiogram October 2017 moderate aortic valve stenosis mean gradient 26 mmHg peak gradient 54 mmHg peak velocity 3.7 m/s  Cardiac catheterization January 2018 moderate to severe 60-70% disease proximal second OM vessel, prior stent in the OM 60% distal edge restenosis, moderate diffuse calcification,  small vessels throughout  Past Medical History:  Diagnosis Date   Arthritis    CAD (coronary artery disease)    a. s/p prior stenting; b. 05/2017 Cath ([facility]): LAD mild  irregs, LCX 58m, OM2 40 ISR, RCA dominant, RPLV 50; c. 02/2018 MV: EF 64%. No ischemia/scar.   Carotid arterial disease ([place])    a. 02/2018 Carotid U/S: 1-39% bilat ICA stenosis. F/u prn.   Chronic back pain    Hyperlipidemia    Hypertension    Hypothyroidism    Juxtafoveal telangietasis of both eyes    Moderate to severe aortic stenosis    a. 04/2016 Echo: Mod AS; b. 02/2018 Echo: EF 55-60%, no rwma, Gr1 DD. Mod to sev AS. Mod AI. Mean grad 97mmHg. Valve area (VTI): 0.71 cm^2.   RBBB    TIA (transient ischemic attack)    Past Surgical History:  Procedure Laterality Date   APPENDECTOMY     BACK SURGERY  08/2016   BREAST SURGERY     Biopsy   CAROTID ENDARTERECTOMY Bilateral    CHOLECYSTECTOMY     TONSILLECTOMY AND ADENOIDECTOMY       No outpatient medications have been marked as taking for the 10/21/18 encounter (Appointment) with Minna Merritts, MD.     Allergies:   Codeine and Penicillins   Social History   Tobacco Use   Smoking status: Former Smoker   Smokeless tobacco: Never Used  Substance Use Topics   Alcohol use: No    Frequency: Never   Drug use: No     Current Outpatient Medications on File Prior to Visit  Medication Sig Dispense Refill   aspirin 81 MG chewable tablet Chew 81 mg by mouth daily.     atorvastatin (LIPITOR) 80 MG tablet Take 1 tablet by mouth at bedtime for cholesterol. 90 tablet 3   isosorbide mononitrate (IMDUR) 30 MG 24 hr tablet Take 1 tablet (30 mg total) by mouth 2 (two) times daily. 180 tablet 3   levothyroxine (SYNTHROID, LEVOTHROID) 25 MCG tablet Take 1 tablet by mouth every morning on an empty stomach with water. No food or medications for 30 minutes. 90 tablet 3   losartan (COZAAR) 100 MG tablet Take 1 tablet by mouth once daily for blood pressure. 90 tablet 3   metoprolol tartrate (LOPRESSOR) 25 MG tablet Take 0.5 tablets (12.5 mg total) by mouth 2 (two) times daily. 90 tablet 3   naproxen (NAPROSYN) 500 MG  tablet Take 1 tablet (500 mg total) by mouth 2 (two) times daily with a meal. 14 tablet 0   nitroGLYCERIN (NITROSTAT) 0.3 MG SL tablet Place 0.3 mg under the tongue every 5 (five) minutes as needed.     No current facility-administered medications on file prior to visit.      Family Hx: The patient's family history includes Alcohol abuse in her father; Arthritis in her sister; COPD in her father and sister; Cancer in her sister; Heart attack in her father and sister; Heart disease in her mother and sister; Hyperlipidemia in her mother; Hypertension in her mother, sister, and sister.  ROS:   Please see the history of present illness.    Review of Systems  Constitutional: Negative.   Respiratory: Negative.   Cardiovascular: Negative.   Gastrointestinal: Negative.   Musculoskeletal: Negative.   Neurological: Positive for dizziness.  Psychiatric/Behavioral: Negative.   All other systems reviewed and are negative.     Labs/Other Tests and Data Reviewed:    Recent Labs: 01/23/2018:  TSH 1.76 02/13/2018: BUN 14; Creatinine, Ser 0.92; Potassium 4.7; Sodium 139   Recent Lipid Panel Lab Results  Component Value Date/Time   CHOL 123 01/23/2018 10:59 AM   TRIG 90.0 01/23/2018 10:59 AM   HDL 48.10 01/23/2018 10:59 AM   CHOLHDL 3 01/23/2018 10:59 AM   LDLCALC 57 01/23/2018 10:59 AM    Wt Readings from Last 3 Encounters:  10/07/18 192 lb (87.1 kg)  07/09/18 195 lb (88.5 kg)  04/08/18 191 lb 12 oz (87 kg)     Exam:    Vital Signs: Vital signs as detailed above in HPI  Well nourished, well developed female in no acute distress. Constitutional:  oriented to person, place, and time. No distress.     ASSESSMENT & PLAN:    Coronary artery disease of native artery of native heart with stable angina pectoris (HCC) Currently with no symptoms of angina. No further workup at this time. Continue current medication regimen.  Stable  PAD (peripheral artery disease) (HCC) Stressed  importance of aggressive lipid control Cholesterol is at goal on current medication regiment  Moderate to severe aortic stenosis We have placed a request for repeat echocardiogram September 2020 Denies having any symptoms at this time Less likely contributing to dizziness, vertigo symptoms  Bilateral carotid artery stenosis Will need periodic ultrasound, on annual basis Stressed importance of aggressive lipid control  Essential hypertension Systolic pressures 299M up to 150s Initially felt orthostasis was because of her dizziness, Now dizziness sounds more like vertigo  Mixed hyperlipidemia LDL goal less than 70 Currently at goal  Vertigo Will send in prescription for meclizine 25 mg every 6 as needed If no improvement in her symptoms we will send to ENT   COVID-19 Education: The signs and symptoms of COVID-19 were discussed with the patient and how to seek care for testing (follow up with PCP or arrange E-visit).  The importance of social distancing was discussed today.  Patient Risk:   After full review of this patients clinical status, I feel that they are at least moderate risk at this time.  Time:   Today, I have spent 25 minutes with the patient with telehealth technology discussing dizziness, vertigo, aortic valve stenosis, PAD and coronary disease.     Medication Adjustments/Labs and Tests Ordered: Current medicines are reviewed at length with the patient today.  Concerns regarding medicines are outlined above.   Tests Ordered: Echo in sept 2020 for aortic valve stenosis   Medication Changes: No changes made   Disposition: Follow-up in 5 months after echocardiogram  Signed, Ida Rogue, MD  10/21/2018 1:58 PM    [facility] Office [address] [place] [address], [place], [place] [address]

## 2018-10-21 NOTE — Patient Instructions (Addendum)
Medication Instructions:  Your physician has recommended you make the following change in your medication:  1. TRY Meclizine 25 mg three times daily as needed for vertigo. This is over the counter at your local pharmacy.  If symptoms do not improve you may need to see ENT   If you need a refill on your cardiac medications before your next appointment, please call your pharmacy.   Lab work: No new labs needed    If you have labs (blood work) drawn today and your tests are completely normal, you will receive your results only by: Marland Kitchen MyChart Message (if you have MyChart) OR . A paper copy in the mail If you have any lab test that is abnormal or we need to change your treatment, we will call you to review the results.   Testing/Procedures: Echocardiogram September 2020 4 aortic valve stenosis   Follow-Up: At [facility], you and your health needs are our priority.  As part of our continuing mission to provide you with exceptional heart care, we have created designated Provider Care Teams.  These Care Teams include your primary Cardiologist (physician) and Advanced Practice Providers (APPs -  Physician Assistants and Nurse Practitioners) who all work together to provide you with the care you need, when you need it.  . You will need a follow up appointment in 5 months after echocardiogram  . Providers on your designated Care Team:   . Murray Hodgkins, NP . Christell Faith, PA-C . Marrianne Mood, PA-C  Any Other Special Instructions Will Be Listed Below (If Applicable).  For educational health videos Log in to : [email or web address] Or : [email or web address], password : [email or web address]

## 2018-11-04 ENCOUNTER — Other Ambulatory Visit: Payer: Self-pay | Admitting: Primary Care

## 2018-11-04 DIAGNOSIS — E039 Hypothyroidism, unspecified: Secondary | ICD-10-CM

## 2018-11-06 ENCOUNTER — Other Ambulatory Visit: Payer: Self-pay | Admitting: Primary Care

## 2018-11-06 DIAGNOSIS — E039 Hypothyroidism, unspecified: Secondary | ICD-10-CM

## 2019-01-07 ENCOUNTER — Other Ambulatory Visit: Payer: Self-pay | Admitting: Primary Care

## 2019-01-07 DIAGNOSIS — E782 Mixed hyperlipidemia: Secondary | ICD-10-CM

## 2019-01-07 DIAGNOSIS — I10 Essential (primary) hypertension: Secondary | ICD-10-CM

## 2019-02-19 ENCOUNTER — Other Ambulatory Visit: Payer: Self-pay | Admitting: *Deleted

## 2019-02-19 ENCOUNTER — Telehealth: Payer: Self-pay | Admitting: Cardiovascular Disease

## 2019-02-19 MED ORDER — NITROGLYCERIN 0.3 MG SL SUBL: 0.3000 mg | SUBLINGUAL_TABLET | SUBLINGUAL | 0 refills | Status: DC | PRN

## 2019-02-19 NOTE — Telephone Encounter (Signed)
Ok to refill Nitro Pt has Coronary artery disease, history of stenting x1.

## 2019-02-19 NOTE — Telephone Encounter (Signed)
°*  STAT* If patient is at the pharmacy, call can be transferred to refill team.   1. Which medications need to be refilled? (please list name of each medication and dose if known)   nitroglycerin   2. Which pharmacy/location (including street and city if local pharmacy) is medication to be sent to? [facility]   3. Do they need a 30 day or 90 day supply? [name]

## 2019-02-19 NOTE — Telephone Encounter (Signed)
Please advise if ok to refill pt's nitroglycerin.

## 2019-02-19 NOTE — Telephone Encounter (Signed)
Requested Prescriptions   Signed Prescriptions Disp Refills  . nitroGLYCERIN (NITROSTAT) 0.3 MG SL tablet 25 tablet 0    Sig: Place 1 tablet (0.3 mg total) under the tongue every 5 (five) minutes as needed.    Authorizing Provider: Minna Merritts    Ordering User: Britt Bottom

## 2019-02-20 MED ORDER — NITROGLYCERIN 0.3 MG SL SUBL: 0.3000 mg | SUBLINGUAL_TABLET | SUBLINGUAL | 0 refills | Status: DC | PRN

## 2019-02-20 NOTE — Telephone Encounter (Signed)
Mail order pharmacy requesting quantity of 100 tab.  Will sent to mail order [facility] per RN.

## 2019-03-19 ENCOUNTER — Ambulatory Visit (INDEPENDENT_AMBULATORY_CARE_PROVIDER_SITE_OTHER): Payer: Medicare Other

## 2019-03-19 DIAGNOSIS — Z23 Encounter for immunization: Secondary | ICD-10-CM

## 2019-04-10 ENCOUNTER — Other Ambulatory Visit: Payer: Self-pay | Admitting: Cardiovascular Disease

## 2019-04-19 ENCOUNTER — Other Ambulatory Visit: Payer: Self-pay | Admitting: Primary Care

## 2019-04-19 DIAGNOSIS — E782 Mixed hyperlipidemia: Secondary | ICD-10-CM

## 2019-04-19 DIAGNOSIS — E039 Hypothyroidism, unspecified: Secondary | ICD-10-CM

## 2019-04-19 DIAGNOSIS — I10 Essential (primary) hypertension: Secondary | ICD-10-CM

## 2019-04-21 ENCOUNTER — Telehealth: Payer: Self-pay

## 2019-04-21 MED ORDER — ISOSORBIDE MONONITRATE ER 30 MG PO TB24: 30.0000 mg | ORAL_TABLET | Freq: Two times a day (BID) | ORAL | 0 refills | Status: DC

## 2019-04-21 NOTE — Telephone Encounter (Signed)
Requested Prescriptions   Signed Prescriptions Disp Refills  . isosorbide mononitrate (IMDUR) 30 MG 24 hr tablet 180 tablet 0    Sig: Take 1 tablet (30 mg total) by mouth 2 (two) times daily. *NEEDS OFFICE VISIT FOR FURTHER REFILLS. THANK YOU!*    Authorizing Provider: Minna Merritts    Ordering User: Raelene Bott, Karynn Deblasi L

## 2019-05-01 DIAGNOSIS — L821 Other seborrheic keratosis: Secondary | ICD-10-CM | POA: Diagnosis not present

## 2019-05-01 DIAGNOSIS — L820 Inflamed seborrheic keratosis: Secondary | ICD-10-CM | POA: Diagnosis not present

## 2019-05-01 DIAGNOSIS — D1800 Hemangioma unspecified site: Secondary | ICD-10-CM | POA: Diagnosis not present

## 2019-05-01 DIAGNOSIS — D179 Benign lipomatous neoplasm, unspecified: Secondary | ICD-10-CM | POA: Diagnosis not present

## 2019-05-01 DIAGNOSIS — L578 Other skin changes due to chronic exposure to nonionizing radiation: Secondary | ICD-10-CM | POA: Diagnosis not present

## 2019-05-06 ENCOUNTER — Ambulatory Visit (INDEPENDENT_AMBULATORY_CARE_PROVIDER_SITE_OTHER): Payer: Medicare Other

## 2019-05-06 ENCOUNTER — Other Ambulatory Visit: Payer: Self-pay

## 2019-05-06 DIAGNOSIS — I359 Nonrheumatic aortic valve disorder, unspecified: Secondary | ICD-10-CM | POA: Diagnosis not present

## 2019-05-14 ENCOUNTER — Telehealth: Payer: Self-pay | Admitting: Cardiovascular Disease

## 2019-05-14 NOTE — Telephone Encounter (Signed)
Call to patient to discuss results. She verbalized understanding and reported that she would like to move her appt up to sooner date based on results.   appt moved to next Monday 11/2.  Advised pt to call for any further questions or concerns.

## 2019-05-14 NOTE — Telephone Encounter (Signed)
Attempted to call the patient. No answer- I left a message to please call back.  

## 2019-05-14 NOTE — Telephone Encounter (Signed)
Notes recorded by Minna Merritts, MD on 05/10/2019 at 11:23 AM EDT  Echo  Aortic valve with mild worsening, still moderate to severe stenosis  Normal cardiac systolic function  Would repeat echo in one year

## 2019-05-15 ENCOUNTER — Telehealth: Payer: Self-pay | Admitting: Cardiovascular Disease

## 2019-05-15 NOTE — Telephone Encounter (Signed)
Please call regarding echo results.  

## 2019-05-15 NOTE — Telephone Encounter (Signed)
DPR on file. Spoke with the patients daughter Victoria Burns who is a Therapist, sports at the [place] in [place]. Victoria Burns sts that they have already been made aware of the patient's echo results. [name] sts that the patient AS has been classified as moderate to severe for several years. Her previous cardiologist in [place] was moving towards ref for TAVR before the patient relocated to [facility]. Victoria Burns sts that the patient has been having progressively increased sob and doesn't think we should wait a year to have recommended repeated echo. Victoria Burns would like to know if the patient can be referred for TAVR consideration. Patient is scheduled with Dr. Rockey Situ on 05/19/19. Saralyn Pilar that I will fwd her concern to Dr. Rockey Situ and we will call back with his recommendation. Victoria Burns verbalized understanding and voiced appreciation for the call back.

## 2019-05-17 NOTE — Progress Notes (Signed)
Cardiology Office Note  Date:  05/19/2019   ID:  TYMESHA HUHN, DOB 13-May-1941, MRN [id number]  PCP:  Pleas Koch, NP   Chief Complaint  Patient presents with  . other    5 month f/u c/o chest pain and sob. Meds reviewed verbally with pt.    HPI:  Ms. Chaput is a 78 year old woman with past medical history of Hypothyroidism Hyperlipidemia Coronary artery disease, history of stenting x1 Hypertension Chronic back pain PAD, bilateral carotid endarterectomy, 15 yr ago 1 year ago Former smoker Moderate to severe AS in 02/2018 Spinal surgery Prior sleep study AHI 13 events per hour and the RDI was 17 events per hour performed May 2016 Presenting for f/u of her  coronary artery disease, aortic valve stenosis  Having some chest pain and SOB About 5 months  Daughter on the phone from out of state Feels she needs TAVR ASAP  Recent echocardiogram reviewed with her Aortic valve area certainly looks that she is in severe range, mean and peak pressure gradients and peak velocity moderate to severe  Reports she is unable to live this way any longer given her shortness of breath symptoms  Checking blood pressure today 99991111 systolic on my check even on repeat Husband does her blood pressure medications She has not been checking her blood pressure at home Reports that she is taken her blood pressure medications this morning No drop in blood pressure today with standing from a sitting position still [id number] systolic  Minimally active, does not go outside the house much, Feels to short of breath with chest tightness does not want to risk it   Previous work-up discussed with her on today's visit diagnostic catheterization was performed in [place] in November 2018 revealing moderate nonobstructive CAD.  Low risk stress test 02/2018   On prior office visit was orthostatic Spironolactone was held  For prior chest pain consistent with stable angina, isosorbide was increased  up to twice daily dosing  EKG personally reviewed by myself on todays visit Shows normal sinus rhythm right bundle branch block nonspecific ST abnormality  Previously reported fluttering Event monitor was reviewed with her in detail She denies having significant palpitations or tachycardia Monitor showing rare SVT, up to 13 beats, 1 pause, One VT 4 beats triggered events were not associated with any significant arrhythmias.   No regular exercise program but does try to stay active Denies any leg swelling  Last cardiac catheterization May 17, 2017 [facility]  stable coronary disease, moderate aortic valve stenosis LAD with mild luminal irregularities, left circumflex 30% mid disease, stent in OM 2 with 40% mid in-stent stenosis Right dominant vessel 50% disease  Carotid ultrasound October 2017 noting 60-79% stenosis on the left  Echocardiogram October 2017 moderate aortic valve stenosis mean gradient 26 mmHg peak gradient 54 mmHg peak velocity 3.7 m/s  Cardiac catheterization January 2018 moderate to severe 60-70% disease proximal second OM vessel, prior stent in the OM 60% distal edge restenosis, moderate diffuse calcification, small vessels throughout  PMH:   has a past medical history of Arthritis, CAD (coronary artery disease), Carotid arterial disease ([place]), Chronic back pain, Hyperlipidemia, Hypertension, Hypothyroidism, Juxtafoveal telangietasis of both eyes, Moderate to severe aortic stenosis, RBBB, and TIA (transient ischemic attack).  PSH:    Past Surgical History:  Procedure Laterality Date  . APPENDECTOMY    . BACK SURGERY  08/2016  . BREAST SURGERY     Biopsy  . CAROTID ENDARTERECTOMY Bilateral   .  CHOLECYSTECTOMY    . TONSILLECTOMY AND ADENOIDECTOMY      Current Outpatient Medications  Medication Sig Dispense Refill  . aspirin 81 MG chewable tablet Chew 81 mg by mouth daily.    Marland Kitchen atorvastatin (LIPITOR) 80 MG tablet TAKE 1 TABLET BY MOUTH AT   BEDTIME FOR CHOLESTEROL. 90 tablet 0  . isosorbide mononitrate (IMDUR) 30 MG 24 hr tablet Take 1 tablet (30 mg total) by mouth 2 (two) times daily. *NEEDS OFFICE VISIT FOR FURTHER REFILLS. THANK YOU!* 180 tablet 0  . levothyroxine (SYNTHROID) 25 MCG tablet TAKE 1 TABLET BY MOUTH  EVERY MORNING ON AN EMPTY  STOMACH WITH WATER - NO  FOOD OR MEDICATIONS FOR 30  MINUTES 90 tablet 0  . losartan (COZAAR) 100 MG tablet TAKE 1 TABLET BY MOUTH ONCE DAILY FOR BLOOD PRESSURE 90 tablet 0  . metoprolol tartrate (LOPRESSOR) 25 MG tablet Take 0.5 tablets (12.5 mg total) by mouth 2 (two) times daily. 90 tablet 3  . Misc Natural Products (FOCUSED MIND PO) Take by mouth daily.    . Multiple Vitamins-Minerals (ZINC PO) Take by mouth daily.    . nitroGLYCERIN (NITROSTAT) 0.3 MG SL tablet Place 1 tablet (0.3 mg total) under the tongue every 5 (five) minutes as needed. 100 tablet 0  . VITAMIN D PO Take by mouth daily.     No current facility-administered medications for this visit.      Allergies:   Codeine and Penicillins   Social History:  The patient  reports that she has quit smoking. She has never used smokeless tobacco. She reports that she does not drink alcohol or use drugs.   Family History:   family history includes Alcohol abuse in her father; Arthritis in her sister; COPD in her father and sister; Cancer in her sister; Heart attack in her father and sister; Heart disease in her mother and sister; Hyperlipidemia in her mother; Hypertension in her mother, sister, and sister.    Review of Systems: Review of Systems  Constitutional: Negative.   HENT: Negative.   Respiratory: Positive for shortness of breath.   Cardiovascular: Positive for chest pain.  Gastrointestinal: Negative.   Musculoskeletal: Negative.   Neurological: Negative.   All other systems reviewed and are negative.   PHYSICAL EXAM: VS:  BP (!) 178/76 (BP Location: Left Arm, Patient Position: Sitting, Cuff Size: Large)   Pulse 64    Ht 5\' 1"  (1.549 m)   Wt 189 lb 8 oz (86 kg)   SpO2 98%   BMI 35.81 kg/m  , BMI Body mass index is 35.81 kg/m. Constitutional:  oriented to person, place, and time. No distress.  HENT:  Head: Grossly normal Eyes:  no discharge. No scleral icterus.  Neck: No JVD, no carotid bruits  Cardiovascular: Regular rate and rhythm, no murmurs appreciated Pulmonary/Chest: Clear to auscultation bilaterally, no wheezes or rails Abdominal: Soft.  no distension.  no tenderness.  Musculoskeletal: Normal range of motion Neurological:  normal muscle tone. Coordination normal. No atrophy Skin: Skin warm and dry Psychiatric: normal affect, pleasant   Recent Labs: No results found for requested labs within last 8760 hours.    Lipid Panel Lab Results  Component Value Date   CHOL 123 01/23/2018   HDL 48.10 01/23/2018   LDLCALC 57 01/23/2018   TRIG 90.0 01/23/2018      Wt Readings from Last 3 Encounters:  05/19/19 189 lb 8 oz (86 kg)  10/07/18 192 lb (87.1 kg)  07/09/18 195 lb (88.5 kg)  ASSESSMENT AND PLAN:  Essential hypertension Blood pressure markedly elevated on today's visit, she does not check at home Recommend we give her clonidine 0.2 mg x 1 today in the office and monitor for short period of time until numbers come down -Then we will start clonidine 0.1 mg twice daily  Coronary artery disease of native artery of native heart with stable angina pectoris ([place]) - Plan: EKG 12-Lead Reports having worsening chest pain shortness of breath She would also like TAVR work-up Feels all of her symptoms are from the valve,  Recommend right and left heart catheterization to assist with work-up and start of TAVR process  Mixed hyperlipidemia At goal  PAD (peripheral artery disease) ([place]) History of Bilateral carotid endarterectomy Prior carotid ultrasound showing less than 39% disease bilaterally Will need periodic ultrasound  Disposition:   1 month after right left heart  cath  Long discussion concerning aortic valve disease, mechanism of valve disease, need for work-up for TAVR, risk and benefit of heart catheterization, discussion of blood pressure  Total encounter time more than 45 minutes  Greater than 50% was spent in counseling and coordination of care with the patient    Orders Placed This Encounter  Procedures  . EKG 12-Lead     Signed, Esmond Plants, M.D., Ph.D. 05/19/2019  [place], [place]

## 2019-05-19 ENCOUNTER — Ambulatory Visit (INDEPENDENT_AMBULATORY_CARE_PROVIDER_SITE_OTHER): Payer: Medicare Other | Admitting: Cardiovascular Disease

## 2019-05-19 ENCOUNTER — Other Ambulatory Visit
Admission: RE | Admit: 2019-05-19 | Discharge: 2019-05-19 | Disposition: A | Discharge status: Home / Self Care | Payer: Medicare Other | Source: Ambulatory Visit | Attending: Cardiovascular Disease | Admitting: Cardiovascular Disease

## 2019-05-19 ENCOUNTER — Other Ambulatory Visit: Payer: Self-pay

## 2019-05-19 ENCOUNTER — Encounter: Payer: Self-pay | Admitting: Cardiovascular Disease

## 2019-05-19 VITALS — BP 176/91 | HR 57 | Ht 61.0 in | Wt 189.5 lb

## 2019-05-19 DIAGNOSIS — I6523 Occlusion and stenosis of bilateral carotid arteries: Secondary | ICD-10-CM | POA: Diagnosis not present

## 2019-05-19 DIAGNOSIS — I350 Nonrheumatic aortic (valve) stenosis: Secondary | ICD-10-CM

## 2019-05-19 DIAGNOSIS — Z01812 Encounter for preprocedural laboratory examination: Secondary | ICD-10-CM | POA: Diagnosis not present

## 2019-05-19 DIAGNOSIS — I739 Peripheral vascular disease, unspecified: Secondary | ICD-10-CM | POA: Diagnosis not present

## 2019-05-19 DIAGNOSIS — I359 Nonrheumatic aortic valve disorder, unspecified: Secondary | ICD-10-CM

## 2019-05-19 DIAGNOSIS — I10 Essential (primary) hypertension: Secondary | ICD-10-CM

## 2019-05-19 DIAGNOSIS — Z20828 Contact with and (suspected) exposure to other viral communicable diseases: Secondary | ICD-10-CM | POA: Diagnosis not present

## 2019-05-19 DIAGNOSIS — I25118 Atherosclerotic heart disease of native coronary artery with other forms of angina pectoris: Secondary | ICD-10-CM | POA: Diagnosis not present

## 2019-05-19 DIAGNOSIS — E782 Mixed hyperlipidemia: Secondary | ICD-10-CM

## 2019-05-19 LAB — BASIC METABOLIC PANEL
Anion gap: 11 (ref 5–15)
BUN: 15 mg/dL (ref 8–23)
CO2: 24 mmol/L (ref 22–32)
Calcium: 9.9 mg/dL (ref 8.9–10.3)
Chloride: 105 mmol/L (ref 98–111)
Creatinine, Ser: 0.77 mg/dL (ref 0.44–1.00)
GFR calc Af Amer: >60 mL/min (ref >60)
GFR calc non Af Amer: >60 mL/min (ref >60)
Glucose, Bld: 111 mg/dL — ABNORMAL HIGH (ref 70–99)
Potassium: 4 mmol/L (ref 3.5–5.1)
Sodium: 140 mmol/L (ref 135–145)

## 2019-05-19 LAB — CBC WITH DIFFERENTIAL/PLATELET
Abs Immature Granulocytes: 0.02 10*3/uL (ref 0.00–0.07)
Basophils Absolute: 0 10*3/uL (ref 0.0–0.1)
Basophils Relative: 1 %
Eosinophils Absolute: 0.1 10*3/uL (ref 0.0–0.5)
Eosinophils Relative: 2 %
HCT: 44.7 % (ref 36.0–46.0)
Hemoglobin: 14.6 g/dL (ref 12.0–15.0)
Immature Granulocytes: 0 %
Lymphocytes Relative: 25 %
Lymphs Abs: 1.6 10*3/uL (ref 0.7–4.0)
MCH: 32.1 pg (ref 26.0–34.0)
MCHC: 32.7 g/dL (ref 30.0–36.0)
MCV: 98.2 fL (ref 80.0–100.0)
Monocytes Absolute: 0.6 10*3/uL (ref 0.1–1.0)
Monocytes Relative: 9 %
Neutro Abs: 4.1 10*3/uL (ref 1.7–7.7)
Neutrophils Relative %: 63 %
Platelets: 198 10*3/uL (ref 150–400)
RBC: 4.55 MIL/uL (ref 3.87–5.11)
RDW: 13.8 % (ref 11.5–15.5)
WBC: 6.4 10*3/uL (ref 4.0–10.5)
nRBC: 0 % (ref 0.0–0.2)

## 2019-05-19 MED ORDER — NITROGLYCERIN 0.3 MG SL SUBL: 0.3000 mg | SUBLINGUAL_TABLET | SUBLINGUAL | 1 refills | Status: DC | PRN

## 2019-05-19 MED ORDER — CLONIDINE HCL 0.1 MG PO TABS: 0.1000 mg | ORAL_TABLET | Freq: Two times a day (BID) | ORAL | 3 refills | Status: DC

## 2019-05-19 MED ORDER — ISOSORBIDE MONONITRATE ER 30 MG PO TB24: 30.0000 mg | ORAL_TABLET | Freq: Two times a day (BID) | ORAL | 2 refills | Status: AC

## 2019-05-19 MED ORDER — METOPROLOL TARTRATE 25 MG PO TABS: 12.5000 mg | ORAL_TABLET | Freq: Two times a day (BID) | ORAL | 3 refills | Status: DC

## 2019-05-19 MED ORDER — ATORVASTATIN CALCIUM 80 MG PO TABS: 80.0000 mg | ORAL_TABLET | Freq: Every day | ORAL | 2 refills | Status: AC

## 2019-05-19 MED ORDER — CLONIDINE HCL 0.1 MG PO TABS
0.2000 mg | ORAL_TABLET | Freq: Once | ORAL | Status: AC
  Administered 2019-05-19: 0.2 mg via ORAL

## 2019-05-19 MED ORDER — CLONIDINE HCL 0.1 MG PO TABS: 0.1000 mg | ORAL_TABLET | Freq: Two times a day (BID) | ORAL | 0 refills | Status: DC

## 2019-05-19 MED ORDER — LOSARTAN POTASSIUM 100 MG PO TABS: 100.0000 mg | ORAL_TABLET | Freq: Every day | ORAL | 2 refills | Status: DC

## 2019-05-19 NOTE — Patient Instructions (Addendum)
We will schedule a cardiac cath Right and Left for known CAD, prior PCI, having angina and shortness of breath  DRIVE THRU TESTING TODAY WHEN YOU LEAVE HERE. Go to the [facility] thru for your labs and testing. COVID, BMP, and CBC  Medication Instructions:  Pressures elevated today We will give you a clonidine pill 0.2 mg x1 pill  Your physician has recommended you make the following change in your medication:  1. START Clonidine 0.1 mg Twice a day   If you need a refill on your cardiac medications before your next appointment, please call your pharmacy.    Lab work: No new labs needed   If you have labs (blood work) drawn today and your tests are completely normal, you will receive your results only by: Marland Kitchen MyChart Message (if you have MyChart) OR . A paper copy in the mail If you have any lab test that is abnormal or we need to change your treatment, we will call you to review the results.   Testing/Procedures: [facility] Cardiac Cath Instructions   You are scheduled for a Cardiac Cath on: Friday Nov. 6th  Please arrive at 08:30 am on the day of your procedure  Please expect a call from our [place] to pre-register you  Do not eat/drink anything after midnight  Someone will need to drive you home  It is recommended someone be with you for the first 24 hours after your procedure  Wear clothes that are easy to get on/off and wear slip on shoes if possible   Medications bring a current list of all medications with you  _XX__ You may take all of your medications the morning of your procedure with enough water to swallow safely   Day of your procedure: Arrive at the [facility] entrance.  Free valet service is available.  After entering the [facility] please check-in at the registration desk (1st desk on your right) to receive your armband. After receiving your armband someone will escort you to the cardiac cath/special procedures waiting  area.  The usual length of stay after your procedure is about 2 to 3 hours.  This can vary.  If you have any questions, please call our office at [phone], or you may call the cardiac cath lab at [facility] directly at [phone]    Follow-Up: At [facility], you and your health needs are our priority.  As part of our continuing mission to provide you with exceptional heart care, we have created designated Provider Care Teams.  These Care Teams include your primary Cardiologist (physician) and Advanced Practice Providers (APPs -  Physician Assistants and Nurse Practitioners) who all work together to provide you with the care you need, when you need it.  . You will need a follow up appointment in 1 month   . Providers on your designated Care Team:   . Murray Hodgkins, NP . Christell Faith, PA-C . Marrianne Mood, PA-C  Any Other Special Instructions Will Be Listed Below (If Applicable).  For educational health videos Log in to : [email or web address] Or : [email or web address], password : [email or web address]

## 2019-05-20 LAB — SARS CORONAVIRUS 2 (TAT 6-24 HRS): SARS Coronavirus 2: NEGATIVE

## 2019-05-22 ENCOUNTER — Other Ambulatory Visit: Payer: Self-pay | Admitting: Cardiovascular Disease

## 2019-05-22 DIAGNOSIS — I200 Unstable angina: Secondary | ICD-10-CM

## 2019-05-23 ENCOUNTER — Encounter
Admission: RE | Disposition: A | Discharge status: Home / Self Care | Payer: Self-pay | Source: Home / Self Care | Attending: Cardiovascular Disease

## 2019-05-23 ENCOUNTER — Other Ambulatory Visit: Payer: Self-pay

## 2019-05-23 ENCOUNTER — Ambulatory Visit
Admission: RE | Admit: 2019-05-23 | Discharge: 2019-05-23 | Disposition: A | Discharge status: Home / Self Care | Payer: Medicare Other | Attending: Cardiovascular Disease | Admitting: Cardiovascular Disease

## 2019-05-23 DIAGNOSIS — I739 Peripheral vascular disease, unspecified: Secondary | ICD-10-CM | POA: Diagnosis not present

## 2019-05-23 DIAGNOSIS — E785 Hyperlipidemia, unspecified: Secondary | ICD-10-CM | POA: Insufficient documentation

## 2019-05-23 DIAGNOSIS — Z79899 Other long term (current) drug therapy: Secondary | ICD-10-CM | POA: Insufficient documentation

## 2019-05-23 DIAGNOSIS — I10 Essential (primary) hypertension: Secondary | ICD-10-CM | POA: Insufficient documentation

## 2019-05-23 DIAGNOSIS — I25110 Atherosclerotic heart disease of native coronary artery with unstable angina pectoris: Secondary | ICD-10-CM

## 2019-05-23 DIAGNOSIS — I2510 Atherosclerotic heart disease of native coronary artery without angina pectoris: Secondary | ICD-10-CM | POA: Diagnosis not present

## 2019-05-23 DIAGNOSIS — T82898A Other specified complication of vascular prosthetic devices, implants and grafts, initial encounter: Secondary | ICD-10-CM | POA: Diagnosis not present

## 2019-05-23 DIAGNOSIS — G8929 Other chronic pain: Secondary | ICD-10-CM | POA: Diagnosis not present

## 2019-05-23 DIAGNOSIS — I200 Unstable angina: Secondary | ICD-10-CM

## 2019-05-23 DIAGNOSIS — Z79890 Hormone replacement therapy (postmenopausal): Secondary | ICD-10-CM | POA: Diagnosis not present

## 2019-05-23 DIAGNOSIS — Z87891 Personal history of nicotine dependence: Secondary | ICD-10-CM | POA: Insufficient documentation

## 2019-05-23 DIAGNOSIS — M549 Dorsalgia, unspecified: Secondary | ICD-10-CM | POA: Diagnosis not present

## 2019-05-23 DIAGNOSIS — I350 Nonrheumatic aortic (valve) stenosis: Secondary | ICD-10-CM | POA: Diagnosis not present

## 2019-05-23 DIAGNOSIS — R0602 Shortness of breath: Secondary | ICD-10-CM

## 2019-05-23 DIAGNOSIS — I25118 Atherosclerotic heart disease of native coronary artery with other forms of angina pectoris: Secondary | ICD-10-CM | POA: Diagnosis present

## 2019-05-23 DIAGNOSIS — E039 Hypothyroidism, unspecified: Secondary | ICD-10-CM | POA: Insufficient documentation

## 2019-05-23 DIAGNOSIS — Z7982 Long term (current) use of aspirin: Secondary | ICD-10-CM | POA: Insufficient documentation

## 2019-05-23 HISTORY — PX: RIGHT/LEFT HEART CATH AND CORONARY ANGIOGRAPHY: CATH118266

## 2019-05-23 SURGERY — RIGHT/LEFT HEART CATH AND CORONARY ANGIOGRAPHY
Anesthesia: Moderate Sedation | Laterality: Bilateral

## 2019-05-23 SURGERY — RIGHT AND LEFT HEART CATH
Anesthesia: Moderate Sedation

## 2019-05-23 MED ORDER — HYDRALAZINE HCL 20 MG/ML IJ SOLN
INTRAMUSCULAR | Status: AC
  Filled 2019-05-23: qty 1

## 2019-05-23 MED ORDER — FENTANYL CITRATE (PF) 100 MCG/2ML IJ SOLN
INTRAMUSCULAR | Status: DC | PRN
  Administered 2019-05-23: 50 ug via INTRAVENOUS

## 2019-05-23 MED ORDER — NITROGLYCERIN 0.4 MG SL SUBL
SUBLINGUAL_TABLET | SUBLINGUAL | Status: AC
  Filled 2019-05-23: qty 1

## 2019-05-23 MED ORDER — FENTANYL CITRATE (PF) 100 MCG/2ML IJ SOLN
INTRAMUSCULAR | Status: DC | PRN
  Administered 2019-05-23 (×2): 25 ug via INTRAVENOUS

## 2019-05-23 MED ORDER — SODIUM CHLORIDE 0.9 % WEIGHT BASED INFUSION: 1.0000 mL/kg/h | INTRAVENOUS | Status: DC

## 2019-05-23 MED ORDER — TRAMADOL HCL 50 MG PO TABS
ORAL_TABLET | ORAL | Status: AC
  Administered 2019-05-23: 50 mg via ORAL
  Filled 2019-05-23: qty 1

## 2019-05-23 MED ORDER — HEPARIN (PORCINE) IN NACL 1000-0.9 UT/500ML-% IV SOLN
INTRAVENOUS | Status: AC
  Filled 2019-05-23: qty 1000

## 2019-05-23 MED ORDER — ONDANSETRON HCL 4 MG/2ML IJ SOLN: 4.0000 mg | Freq: Four times a day (QID) | INTRAMUSCULAR | Status: DC | PRN

## 2019-05-23 MED ORDER — MIDAZOLAM HCL 2 MG/2ML IJ SOLN
INTRAMUSCULAR | Status: DC | PRN
  Administered 2019-05-23: 1 mg via INTRAVENOUS

## 2019-05-23 MED ORDER — SODIUM CHLORIDE 0.9% FLUSH: 3.0000 mL | INTRAVENOUS | Status: DC | PRN

## 2019-05-23 MED ORDER — MORPHINE SULFATE (PF) 4 MG/ML IV SOLN: 1.0000 mg | Freq: Once | INTRAVENOUS | Status: DC

## 2019-05-23 MED ORDER — HYDRALAZINE HCL 20 MG/ML IJ SOLN: 10.0000 mg | INTRAMUSCULAR | Status: DC | PRN

## 2019-05-23 MED ORDER — SODIUM CHLORIDE 0.9% FLUSH: 3.0000 mL | Freq: Two times a day (BID) | INTRAVENOUS | Status: DC

## 2019-05-23 MED ORDER — HEPARIN (PORCINE) IN NACL 1000-0.9 UT/500ML-% IV SOLN
INTRAVENOUS | Status: DC | PRN
  Administered 2019-05-23: 1000 mL

## 2019-05-23 MED ORDER — MIDAZOLAM HCL 2 MG/2ML IJ SOLN
INTRAMUSCULAR | Status: AC
  Filled 2019-05-23: qty 2

## 2019-05-23 MED ORDER — SODIUM CHLORIDE 0.9 % IV SOLN: 250.0000 mL | INTRAVENOUS | Status: DC | PRN

## 2019-05-23 MED ORDER — MIDAZOLAM HCL 2 MG/2ML IJ SOLN
INTRAMUSCULAR | Status: DC | PRN
  Administered 2019-05-23 (×2): 1 mg via INTRAVENOUS

## 2019-05-23 MED ORDER — SODIUM CHLORIDE 0.9 % NICU IV INFUSION SIMPLE
999.0000 mL | INJECTION | Freq: Once | INTRAVENOUS | Status: AC
  Administered 2019-05-23: 1000 mL via INTRAVENOUS
  Filled 2019-05-23: qty 1000

## 2019-05-23 MED ORDER — LABETALOL HCL 5 MG/ML IV SOLN: 10.0000 mg | INTRAVENOUS | Status: DC | PRN

## 2019-05-23 MED ORDER — HYDRALAZINE HCL 20 MG/ML IJ SOLN
INTRAMUSCULAR | Status: DC | PRN
  Administered 2019-05-23 (×2): 10 mg via INTRAVENOUS

## 2019-05-23 MED ORDER — MORPHINE SULFATE (PF) 2 MG/ML IV SOLN
INTRAVENOUS | Status: AC
  Administered 2019-05-23: 18:00:00 1 mg via INTRAVENOUS
  Filled 2019-05-23: qty 1

## 2019-05-23 MED ORDER — IOHEXOL 300 MG/ML  SOLN
INTRAMUSCULAR | Status: DC | PRN
  Administered 2019-05-23: 175 mL

## 2019-05-23 MED ORDER — ACETAMINOPHEN 325 MG PO TABS: 650.0000 mg | ORAL_TABLET | ORAL | Status: DC | PRN

## 2019-05-23 MED ORDER — ASPIRIN 81 MG PO CHEW: 81.0000 mg | CHEWABLE_TABLET | ORAL | Status: DC

## 2019-05-23 MED ORDER — FENTANYL CITRATE (PF) 100 MCG/2ML IJ SOLN
INTRAMUSCULAR | Status: AC
  Filled 2019-05-23: qty 2

## 2019-05-23 MED ORDER — NITROGLYCERIN 0.4 MG SL SUBL
SUBLINGUAL_TABLET | SUBLINGUAL | Status: DC | PRN
  Administered 2019-05-23: .4 mg via SUBLINGUAL

## 2019-05-23 MED ORDER — SODIUM CHLORIDE 0.9 % WEIGHT BASED INFUSION
1.0000 mL/kg/h | INTRAVENOUS | Status: DC
  Administered 2019-05-23: 19:00:00 1 mL/kg/h via INTRAVENOUS

## 2019-05-23 MED ORDER — SODIUM CHLORIDE 0.9 % WEIGHT BASED INFUSION
3.0000 mL/kg/h | INTRAVENOUS | Status: AC
  Administered 2019-05-23: 09:00:00 3 mL/kg/h via INTRAVENOUS

## 2019-05-23 MED ORDER — TRAMADOL HCL 50 MG PO TABS
50.0000 mg | ORAL_TABLET | Freq: Four times a day (QID) | ORAL | Status: DC | PRN
  Administered 2019-05-23: 19:00:00 50 mg via ORAL

## 2019-05-23 MED ORDER — MORPHINE SULFATE (PF) 2 MG/ML IV SOLN
1.0000 mg | Freq: Once | INTRAVENOUS | Status: AC
  Administered 2019-05-23: 18:00:00 1 mg via INTRAVENOUS

## 2019-05-23 SURGICAL SUPPLY — 23 items
CANNULA 5F STIFF (CANNULA) ×2 IMPLANT
CATH AMP RT 5F (CATHETERS) ×2 IMPLANT
CATH BEACON 5 .035 65 KMP TIP (CATHETERS) ×2 IMPLANT
CATH INFINITI 5 FR 3DRC (CATHETERS) ×2 IMPLANT
CATH INFINITI 5 FR RCB (CATHETERS) ×2 IMPLANT
CATH INFINITI 5FR ANG PIGTAIL (CATHETERS) ×2 IMPLANT
CATH INFINITI 5FR JL4 (CATHETERS) ×2 IMPLANT
CATH INFINITI JR4 5F (CATHETERS) ×2 IMPLANT
CATH SWANZ 7F THERMO (CATHETERS) ×2 IMPLANT
DEVICE CLOSURE MYNXGRIP 5F (Vascular Products) ×2 IMPLANT
DEVICE CLOSURE MYNXGRIP 6/7F (Vascular Products) ×2 IMPLANT
GUIDEWIRE .025 260CM (WIRE) ×2 IMPLANT
KIT MANI 3VAL PERCEP (MISCELLANEOUS) ×2 IMPLANT
KIT RIGHT HEART (MISCELLANEOUS) ×2 IMPLANT
NEEDLE PERC 18GX7CM (NEEDLE) ×4 IMPLANT
NEEDLE SMART REG 18GX2-3/4 (NEEDLE) ×4 IMPLANT
PACK CARDIAC CATH (CUSTOM PROCEDURE TRAY) ×2 IMPLANT
SHEATH AVANTI 5FR X 11CM (SHEATH) ×2 IMPLANT
SHEATH AVANTI 7FRX11 (SHEATH) ×2 IMPLANT
WIRE EMERALD 3MM-J .035X260CM (WIRE) ×2 IMPLANT
WIRE EMERALD ST .035X150CM (WIRE) ×2 IMPLANT
WIRE GUIDERIGHT .035X150 (WIRE) ×2 IMPLANT
WIRE HITORQ VERSACORE ST 145CM (WIRE) ×2 IMPLANT

## 2019-05-23 NOTE — OR Nursing (Signed)
Arrived back from caht lab at 1745, complaining of headache. Given sip of ginger ale. 5 minutes later said chest feels like something pushing on chest. Dr Rockey Situ notified. 12 lead EKG obtained, started on 2 liters oxygen via [place] 1 mg morphine given, fluid bolus of 999 x 300 ml bolus for BP in 80's Dr Rockey Situ at bedside, pt reports chest pain hurts to touch of chest. Ice pack applied but pt didn't like it so removed.

## 2019-05-23 NOTE — Op Note (Signed)
 [facility]  Percutaneous Study/Intervention Procedural Note   Date of Surgery: 05/23/2019,5:02 PM  Surgeon:Schnier, Dolores Lory   Pre-operative Diagnosis: Difficult vascular access; aortic stenosis; coronary artery disease  Post-operative diagnosis:  Same  Procedure(s) Performed:  1.  Introduction catheter into aorta right common femoral approach  2.  Exchange of venous sheath over wire same venous access  3.  Ultrasound-guided access to right common femoral artery   Anesthesia: Conscious sedation was administered by the interventional radiology RN under my direct supervision. IV Versed plus fentanyl were utilized. Continuous ECG, pulse oximetry and blood pressure was monitored throughout the entire procedure. Conscious sedation was administered for a total of 20 minutes the remainder of the case conscious sedation was provided by Dr. Rockey Situ.  Sheath: 5 French 11 cm Pinnacle sheath right common femoral; 7 [place] Pinnacle sheath right common femoral vein  Contrast: 30 cc   Fluoroscopy Time: 2.0 minutes  Indications: I have been asked by Dr. Rockey Situ to assist in obtaining vascular access in this difficult patient.  The risk and benefits were reviewed and patient has agreed for me to assist.  Procedure:  Sarika Hargett Cumminsis a 78 y.o. female who was identified and appropriate procedural time out was performed.  The patient was then placed supine on the table and prepped and draped in the usual sterile fashion.  The existing venous sheath is prepped and the J-wire is advanced through the sheath.  With pressure being held the sheath is removed and a new venous sheath inserted over the same venous access.  Wire is then removed and the sheath is flushed with heparinized saline.  Attention is then turned to the arterial access.  Ultrasound was used to evaluate the right common femoral artery.  It was echolucent and pulsatile indicating it is patent .  An ultrasound image  was acquired for the permanent record.  A micropuncture needle was used to access the right common femoral artery under direct ultrasound guidance.  The microwire was then advanced under fluoroscopic guidance without difficulty followed by the micro-sheath.  A 0.035 J wire was advanced without resistance and a 5Fr sheath was placed.    At this point attempts at advancing the wire into the aorta were not successful.  Hand-injection of the contrast through the sheath demonstrated what appeared to be a greater than 90% stenosis at the origin of the right common iliac.  Initially a versa core wire and a Kumpe catheter were used in an attempt to cross this lesion but this was unsuccessful.  Hand-injection of contrast through the Kumpe catheter revealed the greater than 90% stenosis more completely and a Glidewire with a Kumpe catheter was then advanced across the lesion and the Kumpe catheter advanced into the abdominal aorta.  Hand-injection verified intraluminal placement.  At this point Dr. Rockey Situ took over and we will move forward with cardiac catheterization   Disposition: Patient was taken to the recovery room in stable condition having tolerated the procedure well.  Belenda Cruise Schnier 05/23/2019,5:02 PM

## 2019-05-23 NOTE — OR Nursing (Signed)
1900 Pt sat up to eat, pain in chest gone after sat up, pt taken to bathroom to void. Denies complaints. Husband and patient given discharge instructions. Including instruction to call Dr Rockey Situ office Monday for followup and change in Clonidine instructions.

## 2019-05-23 NOTE — Discharge Instructions (Signed)
Per Dr Rockey Situ reduce your Clonidine to half tablet twice a day. (0.05 mg) take BP before administering. If Systolic BP less than [id number] mm/hg then hold that dose.  Moderate Conscious Sedation, Adult, Care After These instructions provide you with information about caring for yourself after your procedure. Your health care provider may also give you more specific instructions. Your treatment has been planned according to current medical practices, but problems sometimes occur. Call your health care provider if you have any problems or questions after your procedure. What can I expect after the procedure? After your procedure, it is common:  To feel sleepy for several hours.  To feel clumsy and have poor balance for several hours.  To have poor judgment for several hours.  To vomit if you eat too soon. Follow these instructions at home: For at least 24 hours after the procedure:   Do not: ? Participate in activities where you could fall or become injured. ? Drive. ? Use heavy machinery. ? Drink alcohol. ? Take sleeping pills or medicines that cause drowsiness. ? Make important decisions or sign legal documents. ? Take care of children on your own.  Rest. Eating and drinking  Follow the diet recommended by your health care provider.  If you vomit: ? Drink water, juice, or soup when you can drink without vomiting. ? Make sure you have little or no nausea before eating solid foods. General instructions  Have a responsible adult stay with you until you are awake and alert.  Take over-the-counter and prescription medicines only as told by your health care provider.  If you smoke, do not smoke without supervision.  Keep all follow-up visits as told by your health care provider. This is important. Contact a health care provider if:  You keep feeling nauseous or you keep vomiting.  You feel light-headed.  You develop a rash.  You have a fever. Get help right away if:  You  have trouble breathing. This information is not intended to replace advice given to you by your health care provider. Make sure you discuss any questions you have with your health care provider. Document Released: 04/23/2013 Document Revised: 06/15/2017 Document Reviewed: 10/23/2015 Elsevier Patient Education  2020 [employer] After This sheet gives you information about how to care for yourself after your procedure. Your health care provider may also give you more specific instructions. If you have problems or questions, contact your health care provider. What can I expect after the procedure? After the procedure, it is common to have bruising and tenderness at the catheter insertion area. Follow these instructions at home: Insertion site care  Follow instructions from your health care provider about how to take care of your insertion site. Make sure you: ? Wash your hands with soap and water before you change your bandage (dressing). If soap and water are not available, use hand sanitizer. ? Change your dressing as told by your health care provider. ? Leave stitches (sutures), skin glue, or adhesive strips in place. These skin closures may need to stay in place for 2 weeks or longer. If adhesive strip edges start to loosen and curl up, you may trim the loose edges. Do not remove adhesive strips completely unless your health care provider tells you to do that.  Do not take baths, swim, or use a hot tub until your health care provider approves.  You may shower 24-48 hours after the procedure or as told by your health care provider. ? Gently wash  the site with plain soap and water. ? Pat the area dry with a clean towel. ? Do not rub the site. This may cause bleeding.  Do not apply powder or lotion to the site. Keep the site clean and dry.  Check your insertion site every day for signs of infection. Check for: ? Redness, swelling, or pain. ? Fluid or  blood. ? Warmth. ? Pus or a bad smell. Activity  Rest as told by your health care provider, usually for 1-2 days.  Do not lift anything that is heavier than 10 lbs. (4.5 kg) or as told by your health care provider.  Do not drive for 24 hours if you were given a medicine to help you relax (sedative).  Do not drive or use heavy machinery while taking prescription pain medicine. General instructions   Return to your normal activities as told by your health care provider, usually in about a week. Ask your health care provider what activities are safe for you.  If the catheter site starts bleeding, lie flat and put pressure on the site. If the bleeding does not stop, get help right away. This is a medical emergency.  Drink enough fluid to keep your urine clear or pale yellow. This helps flush the contrast dye from your body.  Take over-the-counter and prescription medicines only as told by your health care provider.  Keep all follow-up visits as told by your health care provider. This is important. Contact a health care provider if:  You have a fever or chills.  You have redness, swelling, or pain around your insertion site.  You have fluid or blood coming from your insertion site.  The insertion site feels warm to the touch.  You have pus or a bad smell coming from your insertion site.  You have bruising around the insertion site.  You notice blood collecting in the tissue around the catheter site (hematoma). The hematoma may be painful to the touch. Get help right away if:  You have severe pain at the catheter insertion area.  The catheter insertion area swells very fast.  The catheter insertion area is bleeding, and the bleeding does not stop when you hold steady pressure on the area.  The area near or just beyond the catheter insertion site becomes pale, cool, tingly, or numb. These symptoms may represent a serious problem that is an emergency. Do not wait to see if the  symptoms will go away. Get medical help right away. Call your local emergency services (911 in the [place] Do not drive yourself to the hospital. Summary  After the procedure, it is common to have bruising and tenderness at the catheter insertion area.  After the procedure, it is important to rest and drink plenty of fluids.  Do not take baths, swim, or use a hot tub until your health care provider says it is okay to do so. You may shower 24-48 hours after the procedure or as told by your health care provider.  If the catheter site starts bleeding, lie flat and put pressure on the site. If the bleeding does not stop, get help right away. This is a medical emergency. This information is not intended to replace advice given to you by your health care provider. Make sure you discuss any questions you have with your health care provider. Document Released: 01/19/2005 Document Revised: 06/15/2017 Document Reviewed: 06/07/2016 Elsevier Patient Education  2020 [employer].

## 2019-05-24 ENCOUNTER — Telehealth: Payer: Self-pay | Admitting: Cardiovascular Disease

## 2019-05-24 DIAGNOSIS — I25118 Atherosclerotic heart disease of native coronary artery with other forms of angina pectoris: Secondary | ICD-10-CM

## 2019-05-24 NOTE — H&P (Signed)
H&P Addendum, precardiac catheterization  Patient was seen and evaluated prior to Cardiac catheterization procedure Symptoms, prior testing details again confirmed with the patient Patient examined, no significant change from prior exam Lab work reviewed in detail personally by myself Patient understands risk and benefit of the procedure, willing to proceed  Signed, Tim Gollan, MD, Ph.D [facility]    

## 2019-05-24 NOTE — Telephone Encounter (Signed)
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% stenosis ostial right common iliac artery, occluded right SFA  Can we call to check on blood pressure On last clinic visit was started on clonidine 0.1 mg twice daily but she stopped after 2 doses as she reported blood pressure was too low  During cardiac catheterization blood pressure 180 up to 200 consistently We recommend she cut the clonidine in half twice daily If she has side effects from the medication may need to change medications  Also need her to start Lasix 20 mg daily with potassium 10 mEq On catheterization noted elevated right heart pressures BMP in 3 weeks  Signed, Esmond Plants, MD, Ph.D [facility]

## 2019-05-26 ENCOUNTER — Encounter: Payer: Self-pay | Admitting: Cardiovascular Disease

## 2019-05-26 ENCOUNTER — Telehealth: Payer: Self-pay | Admitting: Cardiovascular Disease

## 2019-05-26 MED ORDER — FUROSEMIDE 20 MG PO TABS: 20.0000 mg | ORAL_TABLET | Freq: Every day | ORAL | 2 refills | Status: DC

## 2019-05-26 MED ORDER — POTASSIUM CHLORIDE ER 10 MEQ PO TBCR: 10.0000 meq | EXTENDED_RELEASE_TABLET | Freq: Every day | ORAL | 2 refills | Status: DC

## 2019-05-26 NOTE — Telephone Encounter (Signed)
This addressed in another encounter from Dr Rockey Situ from 05/24/19.

## 2019-05-26 NOTE — Telephone Encounter (Signed)
Called patient.  She is aware of the TAVR consult as her husband told her after the cath last week. Staff message sent to Theodosia Quay, RN to start process.  Patient is aware to start furosemide 20 mg dailiy with potassium 10 mEq daily. Sent to local pharmacy for now and patient will later want sent to [facility].  Her next appointment with Dr Rockey Situ is 06/23/19. SHe is aware to go to the [facility] on 06-16-19 or 06-17-19 to get BMET.  Clarified her clonidine 0.1 mg tablet. Taking 1/2 tablet by mouth two times a day. 11/7 142/81 AM  154/63 PM 11/8 178/70 AM  This reading before morning meds.  130/66 PM 11/9 144/69 AM  Advised her to take BP about 2 hours after morning meds. She verbalized understanding. Routing to Dr Rockey Situ for review.

## 2019-05-26 NOTE — Telephone Encounter (Signed)
Patient calling to fu on directions from recent cath.  She says she is returning someones call to discuss a TAVR?   Please call to clarify

## 2019-05-26 NOTE — Addendum Note (Signed)
Addended by: Vanessa Ralphs on: 05/26/2019 03:18 PM   Modules accepted: Orders

## 2019-05-27 ENCOUNTER — Telehealth: Payer: Self-pay | Admitting: Cardiovascular Disease

## 2019-05-27 NOTE — Telephone Encounter (Signed)
Patient daughter a nurse out of state is calling to get ETA on turn around from TAVR consult to procedure.  She would like to plan travel to be here with patient. Please call . She is on DPR scanned to docs.

## 2019-05-28 ENCOUNTER — Telehealth: Payer: Self-pay | Admitting: Cardiovascular Disease

## 2019-05-28 ENCOUNTER — Other Ambulatory Visit: Payer: Self-pay

## 2019-05-28 DIAGNOSIS — I350 Nonrheumatic aortic (valve) stenosis: Secondary | ICD-10-CM

## 2019-05-28 NOTE — Telephone Encounter (Signed)
I spoke with the pt's daughter in detail about TAVR and the evaluation needed to determine if a pt is a candidate. The pt's daughter does want to travel from [place] to be with the pt if TAVR is performed.  I spoke with her about potential dates for procedure and made her aware that once the pt sees cardiac surgeon then we can make final plans.   Mardene Celeste would like to be placed on speaker during the pt's consult with Dr Angelena Form.  She can be contacted at [phone].

## 2019-05-28 NOTE — Telephone Encounter (Addendum)
Spoke with the patient's daughter Mardene Celeste. She is aware of the patients TAVR consult on 05/30/19 @ 11:40am with Dr.Mcalhany. Mardene Celeste is wanting to know if there is a ETA on the actual TAVR being scheduled. She would like to fly in from [place] to be with the patient. Advised her that they could talk with the nurse working with Dr. Angelena Form on 11/13. She could also contact our [employer] office and ask to speak with Ander Purpura B., RN she may be able to provide an ETA on when the TAVR will be scheduled. Mardene Celeste voiced appreciation for the call back and the assistance.

## 2019-05-28 NOTE — Telephone Encounter (Signed)
New Message:  Victoria Burns, Daughter of the patient wanted to speak with Theodosia Quay. The patient has an initial TAVR consultation with Dr. Angelena Form coming up soon. The daughter lives in [place], but will come into town after the procedure to be with the patient. The daughter just wanted to get acclimated with how the office will proceed following the consultation.  Please do not call the work number associated with her contact information. That number is no longer accurate.

## 2019-05-30 ENCOUNTER — Other Ambulatory Visit: Payer: Self-pay

## 2019-05-30 ENCOUNTER — Ambulatory Visit (INDEPENDENT_AMBULATORY_CARE_PROVIDER_SITE_OTHER): Payer: Medicare Other | Admitting: Cardiovascular Disease

## 2019-05-30 ENCOUNTER — Encounter: Payer: Self-pay | Admitting: Cardiovascular Disease

## 2019-05-30 VITALS — BP 152/70 | HR 72 | Ht 61.0 in | Wt 190.0 lb

## 2019-05-30 DIAGNOSIS — I350 Nonrheumatic aortic (valve) stenosis: Secondary | ICD-10-CM

## 2019-05-30 DIAGNOSIS — I25118 Atherosclerotic heart disease of native coronary artery with other forms of angina pectoris: Secondary | ICD-10-CM

## 2019-05-30 MED ORDER — CLOPIDOGREL BISULFATE 75 MG PO TABS: 75.0000 mg | ORAL_TABLET | Freq: Every day | ORAL | 3 refills | Status: DC

## 2019-05-30 NOTE — Progress Notes (Signed)
[facility] Consult Note  Chief Complaint  Patient presents with  . New Patient (Initial Visit)    Severe aortic valve stenosis   History of Present Illness: 78 yo female with history of CAD, carotid artery disease s/p bilateral carotid endarterectomies, hyperlipidemia, HTN, hypothyroidism, RBBB, prior TIA and severe aortic stenosis who is referred by Dr. Rockey Situ as a new consult for further discussion regarding her aortic stenosis and possible TAVR. She has been followed for moderate aortic stenosis. History of CAD with prior stenting in ( 3 in [place], 1 in [place] and 2 in [place]. Cardiac cath 05/23/19 with 70-80% stenosis just prior to the old stent. Most recent echo October 2020 with LVEF=55-60%, moderate MR. There is moderately severe to severe aortic stenosis. Mean gradient 32 mmHg, peak gradient 69 mmHg, AVA 0.83 cm2, dimensionless index 0.33. She has had bilateral carotid endarterectomies. New finding of PAD during cardiac cath last week. She was found to have occlusion of the right SFA and severe stenosis in the right common iliac artery.   She tells me today that she has been having chest pain with rest and with exertion for several months. She has progressive dyspnea with exertion and fatigue. No lower extremity edema. Rare dizziness. She goes the dentist regularly and has no active dental issues. She lives in [place] in a [place] with her husband. She is retired now. She stayed   Primary Care Physician: Pleas Koch, NP Primary Cardiologist: Ida Rogue Referring Cardiologist: Ida Rogue  Past Medical History:  Diagnosis Date  . Arthritis   . CAD (coronary artery disease)    a. s/p prior stenting; b. 05/2017 Cath ([facility]): LAD mild irregs, LCX 28m, OM2 40 ISR, RCA dominant, RPLV 50; c. 02/2018 MV: EF 64%. No ischemia/scar.  . Carotid arterial disease ([place])    a. 02/2018 Carotid U/S: 1-39% bilat ICA stenosis. F/u prn.  . Chronic  back pain   . Hyperlipidemia   . Hypertension   . Hypothyroidism   . Juxtafoveal telangietasis of both eyes   . Moderate to severe aortic stenosis    a. 04/2016 Echo: Mod AS; b. 02/2018 Echo: EF 55-60%, no rwma, Gr1 DD. Mod to sev AS. Mod AI. Mean grad 64mmHg. Valve area (VTI): 0.71 cm^2.  Marland Kitchen RBBB   . TIA (transient ischemic attack)     Past Surgical History:  Procedure Laterality Date  . APPENDECTOMY    . BACK SURGERY  08/2016  . BREAST SURGERY     Biopsy  . CAROTID ENDARTERECTOMY Bilateral   . CHOLECYSTECTOMY    . RIGHT/LEFT HEART CATH AND CORONARY ANGIOGRAPHY Bilateral 05/23/2019   Procedure: RIGHT/LEFT HEART CATH AND CORONARY ANGIOGRAPHY;  Surgeon: Minna Merritts, MD;  Location: [facility];  Service: Cardiovascular;  Laterality: Bilateral;  . TONSILLECTOMY AND ADENOIDECTOMY      Current Outpatient Medications  Medication Sig Dispense Refill  . aspirin 81 MG chewable tablet Chew 81 mg by mouth daily.    Marland Kitchen atorvastatin (LIPITOR) 80 MG tablet Take 1 tablet (80 mg total) by mouth daily at 6 PM. 90 tablet 2  . cloNIDine (CATAPRES) 0.1 MG tablet Take 1 tablet (0.1 mg total) by mouth 2 (two) times daily. 60 tablet 0  . cloNIDine (CATAPRES) 0.1 MG tablet Take 1 tablet (0.1 mg total) by mouth 2 (two) times daily. 180 tablet 3  . furosemide (LASIX) 20 MG tablet Take 1 tablet (20 mg total) by mouth daily. 30 tablet 2  .  isosorbide mononitrate (IMDUR) 30 MG 24 hr tablet Take 1 tablet (30 mg total) by mouth 2 (two) times daily. 180 tablet 2  . levothyroxine (SYNTHROID) 25 MCG tablet TAKE 1 TABLET BY MOUTH  EVERY MORNING ON AN EMPTY  STOMACH WITH WATER - NO  FOOD OR MEDICATIONS FOR 30  MINUTES 90 tablet 0  . losartan (COZAAR) 100 MG tablet Take 1 tablet (100 mg total) by mouth daily. 90 tablet 2  . metoprolol tartrate (LOPRESSOR) 25 MG tablet Take 0.5 tablets (12.5 mg total) by mouth 2 (two) times daily. 90 tablet 3  . Misc Natural Products (FOCUSED MIND PO) Take 1 tablet by mouth  daily.     . Multiple Vitamins-Minerals (ZINC PO) Take 1 tablet by mouth daily.     . nitroGLYCERIN (NITROSTAT) 0.3 MG SL tablet Place 1 tablet (0.3 mg total) under the tongue every 5 (five) minutes as needed. 25 tablet 1  . Polyethyl Glycol-Propyl Glycol (SYSTANE) 0.4-0.3 % SOLN Place 1 drop into both eyes 2 (two) times daily as needed (Dry eye).    . potassium chloride (KLOR-CON) 10 MEQ tablet Take 1 tablet (10 mEq total) by mouth daily. 30 tablet 2  . VITAMIN D PO Take 1 tablet by mouth daily.     . clopidogrel (PLAVIX) 75 MG tablet Take 1 tablet (75 mg total) by mouth daily. 90 tablet 3   No current facility-administered medications for this visit.     Allergies  Allergen Reactions  . Codeine Rash  . Penicillins Rash    Did it involve swelling of the face/tongue/throat, SOB, or low BP? No Did it involve sudden or severe rash/hives, skin peeling, or any reaction on the inside of your mouth or nose? No Did you need to seek medical attention at a hospital or doctor's office? No When did it last happen?over 50 years ago If all above answers are "NO", may proceed with cephalosporin use.    Social History   Socioeconomic History  . Marital status: Married    Spouse name: Not on file  . Number of children: 3  . Years of education: Not on file  . Highest education level: Not on file  Occupational History  . 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   Social Needs  . Financial resource strain: Not on file  . Food insecurity    Worry: Not on file    Inability: Not on file  . Transportation needs    Medical: Not on file    Non-medical: Not on file  Tobacco Use  . Smoking status: Former Smoker    Types: Cigarettes  . Smokeless tobacco: Never Used  Substance and Sexual Activity  . Alcohol use: No    Frequency: Never  . Drug use: No  . Sexual activity: Yes  Lifestyle  . Physical activity    Days per week: Not on file    Minutes per session: Not on file  . Stress: Not on  file  Relationships  . Social Herbalist on phone: Not on file    Gets together: Not on file    Attends religious service: Not on file    Active member of club or organization: Not on file    Attends meetings of clubs or organizations: Not on file    Relationship status: Not on file  . Intimate partner violence    Fear of current or ex partner: Not on file    Emotionally abused: Not on file  Physically abused: Not on file    Forced sexual activity: Not on file  Other Topics Concern  . Not on file  Social History Narrative  . Not on file    Family History  Problem Relation Age of Onset  . Heart disease Mother   . Hyperlipidemia Mother   . Hypertension Mother   . Alcohol abuse Father   . COPD Father   . Heart attack Father   . Arthritis Sister   . Hypertension Sister   . Cancer Sister   . COPD Sister   . Heart attack Sister   . Heart disease Sister   . Hypertension Sister     Review of Systems:  As stated in the HPI and otherwise negative.   BP (!) 152/70   Pulse 72   Ht 5\' 1"  (1.549 m)   Wt 190 lb (86.2 kg)   SpO2 97%   BMI 35.90 kg/m   Physical Examination: General: Well developed, well nourished, NAD  HEENT: OP clear, mucus membranes moist  SKIN: warm, dry. No rashes. Neuro: No focal deficits  Musculoskeletal: Muscle strength 5/5 all ext  Psychiatric: Mood and affect normal  Neck: No JVD, no carotid bruits, no thyromegaly, no lymphadenopathy.  Lungs:Clear bilaterally, no wheezes, rhonci, crackles Cardiovascular: Regular rate and rhythm. Loud, harsh, late peaking systolic murmur.  Abdomen:Soft. Bowel sounds present. Non-tender.  Extremities: No lower extremity edema. Pulses are 2 + in the bilateral DP/PT.  EKG:  EKG from 05/19/19 is reviewed and shows sinus with RBBB, inferior TWI   Echo 05/06/19:  1. Left ventricular ejection fraction, by visual estimation, is 55 to 60%. The left ventricle has normal function. Normal left ventricular size.  There is mildly increased left ventricular hypertrophy.  2. Left ventricular diastolic Doppler parameters are consistent with impaired relaxation pattern of LV diastolic filling.  3. Global right ventricle has normal systolic function.The right ventricular size is normal. No increase in right ventricular wall thickness.  4. Left atrial size was mildly dilated.  5. Right atrial size was normal.  6. The mitral valve is normal in structure. Moderate mitral valve regurgitation.  7. The tricuspid valve is grossly normal. Tricuspid valve regurgitation is trivial.  8. The aortic valve is tricuspid Aortic valve regurgitation is moderate by color flow Doppler. Moderate to severe aortic valve stenosis.  9. Peak aortic valve velocity has increased since 02/2018; peak velocity and valve area are both now in the severe stenosis range. Mean gradient and dimensionless index remain in the moderate range. 10. The pulmonic valve was not well visualized. Pulmonic valve regurgitation is not visualized by color flow Doppler. 11. Normal pulmonary artery systolic pressure. 12. The inferior vena cava is normal in size with greater than 50% respiratory variability, suggesting right atrial pressure of 3 mmHg. 13. The interatrial septum was not well visualized.  FINDINGS  Left Ventricle: Left ventricular ejection fraction, by visual estimation, is 55 to 60%. The left ventricle has normal function. No evidence of left ventricular regional wall motion abnormalities. There is mildly increased left ventricular hypertrophy.  Normal left ventricular size. Spectral Doppler shows Left ventricular diastolic Doppler parameters are consistent with impaired relaxation pattern of LV diastolic filling.  Right Ventricle: The right ventricular size is normal. No increase in right ventricular wall thickness. Global RV systolic function is has normal systolic function. The tricuspid regurgitant velocity is 2.77 m/s, and with an assumed right  atrial pressure  of 3 mmHg, the estimated right ventricular systolic pressure is normal  at 33.7 mmHg.  Left Atrium: Left atrial size was mildly dilated.  Right Atrium: Right atrial size was normal in size  Pericardium: There is no evidence of pericardial effusion.  Mitral Valve: The mitral valve is normal in structure. Moderate mitral valve regurgitation.  Tricuspid Valve: The tricuspid valve is grossly normal. Tricuspid valve regurgitation is trivial by color flow Doppler.  Aortic Valve: The aortic valve is tricuspid. Aortic valve regurgitation is moderate by color flow Doppler. Aortic regurgitation PHT measures 356 msec. Moderate to severe aortic stenosis is present. Aortic valve mean gradient measures 32.0 mmHg. Aortic  valve peak gradient measures 69.2 mmHg. Aortic valve area, by VTI measures 0.83 cm. Peak aortic valve velocity has increased since 02/2018; peak velocity and valve area are both now in the severe stenosis range. Mean gradient and dimensionless index  remain in the moderate range.  Pulmonic Valve: The pulmonic valve was not well visualized. Pulmonic valve regurgitation is not visualized by color flow Doppler. No evidence of pulmonic stenosis.  Aorta: The aortic root is normal in size and structure.  Pulmonary Artery: The pulmonary artery is not well seen.  Venous: The inferior vena cava is normal in size with greater than 50% respiratory variability, suggesting right atrial pressure of 3 mmHg.  IAS/Shunts: The interatrial septum was not well visualized.     LEFT VENTRICLE PLAX 2D LVIDd:         5.00 cm       Diastology LVIDs:         3.50 cm       LV e' lateral:   5.98 cm/s LV PW:         1.10 cm       LV E/e' lateral: 15.6 LV IVS:        1.00 cm       LV e' medial:    6.31 cm/s LVOT diam:     1.80 cm       LV E/e' medial:  14.8 LV SV:         67 ml LV SV Index:   33.82 LVOT Area:     2.54 cm   LV Volumes (MOD) LV area d, A2C:    23.20 cm LV  area d, A4C:    29.30 cm LV area s, A4C:    17.00 cm LV major d, A2C:   7.22 cm LV major d, A4C:   7.28 cm LV major s, A4C:   6.39 cm LV vol d, MOD A2C: 63.4 ml LV vol d, MOD A4C: 97.7 ml LV vol s, MOD A4C: 39.2 ml LV SV MOD A4C:     97.7 ml  RIGHT VENTRICLE             IVC RV Basal diam:  2.60 cm     IVC diam: 2.20 cm RV S prime:     11.10 cm/s TAPSE (M-mode): 2.5 cm  LEFT ATRIUM             Index       RIGHT ATRIUM           Index LA diam:        4.20 cm 2.24 cm/m  RA Area:     13.80 cm LA Vol (A2C):   43.1 ml 22.94 ml/m RA Volume:   32.40 ml  17.24 ml/m LA Vol (A4C):   67.3 ml 35.82 ml/m LA Biplane Vol: 55.4 ml 29.49 ml/m  AORTIC VALVE  PULMONIC VALVE AV Area (Vmax):    0.80 cm     PV Vmax:       0.85 m/s AV Area (Vmean):   0.85 cm     PV Peak grad:  2.9 mmHg AV Area (VTI):     0.83 cm AV Vmax:           416.00 cm/s AV Vmean:          259.000 cm/s AV VTI:            1.030 m AV Peak Grad:      69.2 mmHg AV Mean Grad:      32.0 mmHg LVOT Vmax:         131.00 cm/s LVOT Vmean:        86.900 cm/s LVOT VTI:          0.335 m LVOT/AV VTI ratio: 0.33 AI PHT:            356 msec   AORTA Ao Root diam: 2.80 cm  MITRAL VALVE                         TRICUSPID VALVE MV Area (PHT): 4.15 cm              TR Peak grad:   30.7 mmHg MV PHT:        53.07 msec            TR Vmax:        277.00 cm/s MV Decel Time: 183 msec MV E velocity: 93.10 cm/s  103 cm/s  SHUNTS MV A velocity: 104.00 cm/s 70.3 cm/s Systemic VTI:  0.34 m MV E/A ratio:  0.90        1.5       Systemic Diam: 1.80 cm  Cardiac cath 05/23/19:  Left mainstem:   Large vessel that bifurcates into the LAD and left circumflex, no significant disease noted  Left anterior descending (LAD): Moderate sized vessel that appears to terminate prior to the apex, diagonal branch 2 of small size, mild to moderate diffuse disease  Left circumflex (LCx):  Large vessel with OM branch 2, severe OM disease  proximal region estimated 70 to 80%, location appears at proximal stent edge, moderate mid OM disease estimated 60%  Right coronary artery (RCA):  Right dominant vessel with PL and PDA, moderate diffuse disease  Left ventriculography: LV gram not performed given contrast load Very difficult time crossing aortic valve Valve was crossed with tremendous difficulty, unable to torque the catheter well secondary to severe common iliac disease Catheter damped, and inability to manipulate catheter off the wall.  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 Occluded right SFA  Recent Labs: 05/19/2019: BUN 15; Creatinine, Ser 0.77; Hemoglobin 14.6; Platelets 198; Potassium 4.0; Sodium 140   Lipid Panel    Component Value Date/Time   CHOL 123 01/23/2018 1059   TRIG 90.0 01/23/2018 1059   HDL 48.10 01/23/2018 1059   CHOLHDL 3 01/23/2018 1059   VLDL 18.0 01/23/2018 1059   LDLCALC 57 01/23/2018 1059     Wt Readings  from Last 3 Encounters:  05/30/19 190 lb (86.2 kg)  05/23/19 189 lb (85.7 kg)  05/19/19 189 lb 8 oz (86 kg)     Other studies Reviewed: Additional studies/ records that were reviewed today include: cath images, echo images, office notes Review of the above records demonstrates: severe AS   Assessment and Plan:   1. Severe Aortic Valve Stenosis: She has severe, stage D aortic valve stenosis. I have personally reviewed the echo images. The aortic valve is thickened, calcified with limited leaflet mobility. I think she would benefit from AVR. Given advanced age, she is not a good candidate for conventional AVR by surgical  approach. I think she may be a good candidate for TAVR.   STS Risk Score: Risk of Mortality: 3.613% Renal Failure: 1.096% Permanent Stroke: 1.756% Prolonged Ventilation: 9.886% DSW Infection: 0.137% Reoperation: 3.905% Morbidity or Mortality: 14.558% Short Length of Stay: 30.248% Long Length of Stay: 5.386%   I have reviewed the natural history of aortic stenosis with the patient and their family members  who are present today. We have discussed the limitations of medical therapy and the poor prognosis associated with symptomatic aortic stenosis. We have reviewed potential treatment options, including palliative medical therapy, conventional surgical aortic valve replacement, and transcatheter aortic valve replacement. We discussed treatment options in the context of the patient's specific comorbid medical conditions.   She would like to proceed with planning for TAVR. I think the obtuse marginal stenosis is severe. I will arrange a PCI of the obtuse marginal branch at [facility] on 06/03/19.  Risks and benefits of the PCI procedure and the valve procedure are reviewed with the patient. After the PCI, she will have a cardiac CT, CTA of the chest/abdomen and pelvis, carotid artery dopplers, PT assessment and will then be referred to see one of the CT surgeons on our TAVR team. I would anticipate that she may need alternative access for TAVR given recent findings during her cardiac cath but this will be better evaluated with the CTA.  -I will load Plavix today in anticipation of the PCI and then continue Plavix 75 mg daily -Pre-cath labs today   Current medicines are reviewed at length with the patient today.  The patient does not have concerns regarding medicines.  The following changes have been made:  no change  Labs/ tests ordered today include:   Orders Placed This Encounter  Procedures  . Basic Metabolic Panel (BMET)  . CBC w/Diff     Disposition:   FU with the valve team.     Signed, Lauree Chandler, MD 05/30/2019 1:37 PM    [address], [place]  [address] Phone: [phone]; Fax: [phone]

## 2019-05-30 NOTE — H&P (View-Only) (Signed)
[facility] Consult Note  Chief Complaint  Patient presents with  . New Patient (Initial Visit)    Severe aortic valve stenosis   History of Present Illness: 78 yo female with history of CAD, carotid artery disease s/p bilateral carotid endarterectomies, hyperlipidemia, HTN, hypothyroidism, RBBB, prior TIA and severe aortic stenosis who is referred by Dr. Rockey Situ as a new consult for further discussion regarding her aortic stenosis and possible TAVR. She has been followed for moderate aortic stenosis. History of CAD with prior stenting in ( 3 in [place], 1 in [place] and 2 in [place]. Cardiac cath 05/23/19 with 70-80% stenosis just prior to the old stent. Most recent echo October 2020 with LVEF=55-60%, moderate MR. There is moderately severe to severe aortic stenosis. Mean gradient 32 mmHg, peak gradient 69 mmHg, AVA 0.83 cm2, dimensionless index 0.33. She has had bilateral carotid endarterectomies. New finding of PAD during cardiac cath last week. She was found to have occlusion of the right SFA and severe stenosis in the right common iliac artery.   She tells me today that she has been having chest pain with rest and with exertion for several months. She has progressive dyspnea with exertion and fatigue. No lower extremity edema. Rare dizziness. She goes the dentist regularly and has no active dental issues. She lives in [place] in a [place] with her husband. She is retired now. She stayed   Primary Care Physician: Pleas Koch, NP Primary Cardiologist: Ida Rogue Referring Cardiologist: Ida Rogue  Past Medical History:  Diagnosis Date  . Arthritis   . CAD (coronary artery disease)    a. s/p prior stenting; b. 05/2017 Cath ([facility]): LAD mild irregs, LCX 75m, OM2 40 ISR, RCA dominant, RPLV 50; c. 02/2018 MV: EF 64%. No ischemia/scar.  . Carotid arterial disease ([place])    a. 02/2018 Carotid U/S: 1-39% bilat ICA stenosis. F/u prn.  . Chronic  back pain   . Hyperlipidemia   . Hypertension   . Hypothyroidism   . Juxtafoveal telangietasis of both eyes   . Moderate to severe aortic stenosis    a. 04/2016 Echo: Mod AS; b. 02/2018 Echo: EF 55-60%, no rwma, Gr1 DD. Mod to sev AS. Mod AI. Mean grad 2mmHg. Valve area (VTI): 0.71 cm^2.  Marland Kitchen RBBB   . TIA (transient ischemic attack)     Past Surgical History:  Procedure Laterality Date  . APPENDECTOMY    . BACK SURGERY  08/2016  . BREAST SURGERY     Biopsy  . CAROTID ENDARTERECTOMY Bilateral   . CHOLECYSTECTOMY    . RIGHT/LEFT HEART CATH AND CORONARY ANGIOGRAPHY Bilateral 05/23/2019   Procedure: RIGHT/LEFT HEART CATH AND CORONARY ANGIOGRAPHY;  Surgeon: Minna Merritts, MD;  Location: [facility];  Service: Cardiovascular;  Laterality: Bilateral;  . TONSILLECTOMY AND ADENOIDECTOMY      Current Outpatient Medications  Medication Sig Dispense Refill  . aspirin 81 MG chewable tablet Chew 81 mg by mouth daily.    Marland Kitchen atorvastatin (LIPITOR) 80 MG tablet Take 1 tablet (80 mg total) by mouth daily at 6 PM. 90 tablet 2  . cloNIDine (CATAPRES) 0.1 MG tablet Take 1 tablet (0.1 mg total) by mouth 2 (two) times daily. 60 tablet 0  . cloNIDine (CATAPRES) 0.1 MG tablet Take 1 tablet (0.1 mg total) by mouth 2 (two) times daily. 180 tablet 3  . furosemide (LASIX) 20 MG tablet Take 1 tablet (20 mg total) by mouth daily. 30 tablet 2  .  isosorbide mononitrate (IMDUR) 30 MG 24 hr tablet Take 1 tablet (30 mg total) by mouth 2 (two) times daily. 180 tablet 2  . levothyroxine (SYNTHROID) 25 MCG tablet TAKE 1 TABLET BY MOUTH  EVERY MORNING ON AN EMPTY  STOMACH WITH WATER - NO  FOOD OR MEDICATIONS FOR 30  MINUTES 90 tablet 0  . losartan (COZAAR) 100 MG tablet Take 1 tablet (100 mg total) by mouth daily. 90 tablet 2  . metoprolol tartrate (LOPRESSOR) 25 MG tablet Take 0.5 tablets (12.5 mg total) by mouth 2 (two) times daily. 90 tablet 3  . Misc Natural Products (FOCUSED MIND PO) Take 1 tablet by mouth  daily.     . Multiple Vitamins-Minerals (ZINC PO) Take 1 tablet by mouth daily.     . nitroGLYCERIN (NITROSTAT) 0.3 MG SL tablet Place 1 tablet (0.3 mg total) under the tongue every 5 (five) minutes as needed. 25 tablet 1  . Polyethyl Glycol-Propyl Glycol (SYSTANE) 0.4-0.3 % SOLN Place 1 drop into both eyes 2 (two) times daily as needed (Dry eye).    . potassium chloride (KLOR-CON) 10 MEQ tablet Take 1 tablet (10 mEq total) by mouth daily. 30 tablet 2  . VITAMIN D PO Take 1 tablet by mouth daily.     . clopidogrel (PLAVIX) 75 MG tablet Take 1 tablet (75 mg total) by mouth daily. 90 tablet 3   No current facility-administered medications for this visit.     Allergies  Allergen Reactions  . Codeine Rash  . Penicillins Rash    Did it involve swelling of the face/tongue/throat, SOB, or low BP? No Did it involve sudden or severe rash/hives, skin peeling, or any reaction on the inside of your mouth or nose? No Did you need to seek medical attention at a hospital or doctor's office? No When did it last happen?over 50 years ago If all above answers are "NO", may proceed with cephalosporin use.    Social History   Socioeconomic History  . Marital status: Married    Spouse name: Not on file  . Number of children: 3  . Years of education: Not on file  . Highest education level: Not on file  Occupational History  . 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   Social Needs  . Financial resource strain: Not on file  . Food insecurity    Worry: Not on file    Inability: Not on file  . Transportation needs    Medical: Not on file    Non-medical: Not on file  Tobacco Use  . Smoking status: Former Smoker    Types: Cigarettes  . Smokeless tobacco: Never Used  Substance and Sexual Activity  . Alcohol use: No    Frequency: Never  . Drug use: No  . Sexual activity: Yes  Lifestyle  . Physical activity    Days per week: Not on file    Minutes per session: Not on file  . Stress: Not on  file  Relationships  . Social Herbalist on phone: Not on file    Gets together: Not on file    Attends religious service: Not on file    Active member of club or organization: Not on file    Attends meetings of clubs or organizations: Not on file    Relationship status: Not on file  . Intimate partner violence    Fear of current or ex partner: Not on file    Emotionally abused: Not on file  Physically abused: Not on file    Forced sexual activity: Not on file  Other Topics Concern  . Not on file  Social History Narrative  . Not on file    Family History  Problem Relation Age of Onset  . Heart disease Mother   . Hyperlipidemia Mother   . Hypertension Mother   . Alcohol abuse Father   . COPD Father   . Heart attack Father   . Arthritis Sister   . Hypertension Sister   . Cancer Sister   . COPD Sister   . Heart attack Sister   . Heart disease Sister   . Hypertension Sister     Review of Systems:  As stated in the HPI and otherwise negative.   BP (!) 152/70   Pulse 72   Ht 5\' 1"  (1.549 m)   Wt 190 lb (86.2 kg)   SpO2 97%   BMI 35.90 kg/m   Physical Examination: General: Well developed, well nourished, NAD  HEENT: OP clear, mucus membranes moist  SKIN: warm, dry. No rashes. Neuro: No focal deficits  Musculoskeletal: Muscle strength 5/5 all ext  Psychiatric: Mood and affect normal  Neck: No JVD, no carotid bruits, no thyromegaly, no lymphadenopathy.  Lungs:Clear bilaterally, no wheezes, rhonci, crackles Cardiovascular: Regular rate and rhythm. Loud, harsh, late peaking systolic murmur.  Abdomen:Soft. Bowel sounds present. Non-tender.  Extremities: No lower extremity edema. Pulses are 2 + in the bilateral DP/PT.  EKG:  EKG from 05/19/19 is reviewed and shows sinus with RBBB, inferior TWI   Echo 05/06/19:  1. Left ventricular ejection fraction, by visual estimation, is 55 to 60%. The left ventricle has normal function. Normal left ventricular size.  There is mildly increased left ventricular hypertrophy.  2. Left ventricular diastolic Doppler parameters are consistent with impaired relaxation pattern of LV diastolic filling.  3. Global right ventricle has normal systolic function.The right ventricular size is normal. No increase in right ventricular wall thickness.  4. Left atrial size was mildly dilated.  5. Right atrial size was normal.  6. The mitral valve is normal in structure. Moderate mitral valve regurgitation.  7. The tricuspid valve is grossly normal. Tricuspid valve regurgitation is trivial.  8. The aortic valve is tricuspid Aortic valve regurgitation is moderate by color flow Doppler. Moderate to severe aortic valve stenosis.  9. Peak aortic valve velocity has increased since 02/2018; peak velocity and valve area are both now in the severe stenosis range. Mean gradient and dimensionless index remain in the moderate range. 10. The pulmonic valve was not well visualized. Pulmonic valve regurgitation is not visualized by color flow Doppler. 11. Normal pulmonary artery systolic pressure. 12. The inferior vena cava is normal in size with greater than 50% respiratory variability, suggesting right atrial pressure of 3 mmHg. 13. The interatrial septum was not well visualized.  FINDINGS  Left Ventricle: Left ventricular ejection fraction, by visual estimation, is 55 to 60%. The left ventricle has normal function. No evidence of left ventricular regional wall motion abnormalities. There is mildly increased left ventricular hypertrophy.  Normal left ventricular size. Spectral Doppler shows Left ventricular diastolic Doppler parameters are consistent with impaired relaxation pattern of LV diastolic filling.  Right Ventricle: The right ventricular size is normal. No increase in right ventricular wall thickness. Global RV systolic function is has normal systolic function. The tricuspid regurgitant velocity is 2.77 m/s, and with an assumed right  atrial pressure  of 3 mmHg, the estimated right ventricular systolic pressure is normal  at 33.7 mmHg.  Left Atrium: Left atrial size was mildly dilated.  Right Atrium: Right atrial size was normal in size  Pericardium: There is no evidence of pericardial effusion.  Mitral Valve: The mitral valve is normal in structure. Moderate mitral valve regurgitation.  Tricuspid Valve: The tricuspid valve is grossly normal. Tricuspid valve regurgitation is trivial by color flow Doppler.  Aortic Valve: The aortic valve is tricuspid. Aortic valve regurgitation is moderate by color flow Doppler. Aortic regurgitation PHT measures 356 msec. Moderate to severe aortic stenosis is present. Aortic valve mean gradient measures 32.0 mmHg. Aortic  valve peak gradient measures 69.2 mmHg. Aortic valve area, by VTI measures 0.83 cm. Peak aortic valve velocity has increased since 02/2018; peak velocity and valve area are both now in the severe stenosis range. Mean gradient and dimensionless index  remain in the moderate range.  Pulmonic Valve: The pulmonic valve was not well visualized. Pulmonic valve regurgitation is not visualized by color flow Doppler. No evidence of pulmonic stenosis.  Aorta: The aortic root is normal in size and structure.  Pulmonary Artery: The pulmonary artery is not well seen.  Venous: The inferior vena cava is normal in size with greater than 50% respiratory variability, suggesting right atrial pressure of 3 mmHg.  IAS/Shunts: The interatrial septum was not well visualized.     LEFT VENTRICLE PLAX 2D LVIDd:         5.00 cm       Diastology LVIDs:         3.50 cm       LV e' lateral:   5.98 cm/s LV PW:         1.10 cm       LV E/e' lateral: 15.6 LV IVS:        1.00 cm       LV e' medial:    6.31 cm/s LVOT diam:     1.80 cm       LV E/e' medial:  14.8 LV SV:         67 ml LV SV Index:   33.82 LVOT Area:     2.54 cm   LV Volumes (MOD) LV area d, A2C:    23.20 cm LV  area d, A4C:    29.30 cm LV area s, A4C:    17.00 cm LV major d, A2C:   7.22 cm LV major d, A4C:   7.28 cm LV major s, A4C:   6.39 cm LV vol d, MOD A2C: 63.4 ml LV vol d, MOD A4C: 97.7 ml LV vol s, MOD A4C: 39.2 ml LV SV MOD A4C:     97.7 ml  RIGHT VENTRICLE             IVC RV Basal diam:  2.60 cm     IVC diam: 2.20 cm RV S prime:     11.10 cm/s TAPSE (M-mode): 2.5 cm  LEFT ATRIUM             Index       RIGHT ATRIUM           Index LA diam:        4.20 cm 2.24 cm/m  RA Area:     13.80 cm LA Vol (A2C):   43.1 ml 22.94 ml/m RA Volume:   32.40 ml  17.24 ml/m LA Vol (A4C):   67.3 ml 35.82 ml/m LA Biplane Vol: 55.4 ml 29.49 ml/m  AORTIC VALVE  PULMONIC VALVE AV Area (Vmax):    0.80 cm     PV Vmax:       0.85 m/s AV Area (Vmean):   0.85 cm     PV Peak grad:  2.9 mmHg AV Area (VTI):     0.83 cm AV Vmax:           416.00 cm/s AV Vmean:          259.000 cm/s AV VTI:            1.030 m AV Peak Grad:      69.2 mmHg AV Mean Grad:      32.0 mmHg LVOT Vmax:         131.00 cm/s LVOT Vmean:        86.900 cm/s LVOT VTI:          0.335 m LVOT/AV VTI ratio: 0.33 AI PHT:            356 msec   AORTA Ao Root diam: 2.80 cm  MITRAL VALVE                         TRICUSPID VALVE MV Area (PHT): 4.15 cm              TR Peak grad:   30.7 mmHg MV PHT:        53.07 msec            TR Vmax:        277.00 cm/s MV Decel Time: 183 msec MV E velocity: 93.10 cm/s  103 cm/s  SHUNTS MV A velocity: 104.00 cm/s 70.3 cm/s Systemic VTI:  0.34 m MV E/A ratio:  0.90        1.5       Systemic Diam: 1.80 cm  Cardiac cath 05/23/19:  Left mainstem:   Large vessel that bifurcates into the LAD and left circumflex, no significant disease noted  Left anterior descending (LAD): Moderate sized vessel that appears to terminate prior to the apex, diagonal branch 2 of small size, mild to moderate diffuse disease  Left circumflex (LCx):  Large vessel with OM branch 2, severe OM disease  proximal region estimated 70 to 80%, location appears at proximal stent edge, moderate mid OM disease estimated 60%  Right coronary artery (RCA):  Right dominant vessel with PL and PDA, moderate diffuse disease  Left ventriculography: LV gram not performed given contrast load Very difficult time crossing aortic valve Valve was crossed with tremendous difficulty, unable to torque the catheter well secondary to severe common iliac disease Catheter damped, and inability to manipulate catheter off the wall.  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 Occluded right SFA  Recent Labs: 05/19/2019: BUN 15; Creatinine, Ser 0.77; Hemoglobin 14.6; Platelets 198; Potassium 4.0; Sodium 140   Lipid Panel    Component Value Date/Time   CHOL 123 01/23/2018 1059   TRIG 90.0 01/23/2018 1059   HDL 48.10 01/23/2018 1059   CHOLHDL 3 01/23/2018 1059   VLDL 18.0 01/23/2018 1059   LDLCALC 57 01/23/2018 1059     Wt Readings  from Last 3 Encounters:  05/30/19 190 lb (86.2 kg)  05/23/19 189 lb (85.7 kg)  05/19/19 189 lb 8 oz (86 kg)     Other studies Reviewed: Additional studies/ records that were reviewed today include: cath images, echo images, office notes Review of the above records demonstrates: severe AS   Assessment and Plan:   1. Severe Aortic Valve Stenosis: She has severe, stage D aortic valve stenosis. I have personally reviewed the echo images. The aortic valve is thickened, calcified with limited leaflet mobility. I think she would benefit from AVR. Given advanced age, she is not a good candidate for conventional AVR by surgical  approach. I think she may be a good candidate for TAVR.   STS Risk Score: Risk of Mortality: 3.613% Renal Failure: 1.096% Permanent Stroke: 1.756% Prolonged Ventilation: 9.886% DSW Infection: 0.137% Reoperation: 3.905% Morbidity or Mortality: 14.558% Short Length of Stay: 30.248% Long Length of Stay: 5.386%   I have reviewed the natural history of aortic stenosis with the patient and their family members  who are present today. We have discussed the limitations of medical therapy and the poor prognosis associated with symptomatic aortic stenosis. We have reviewed potential treatment options, including palliative medical therapy, conventional surgical aortic valve replacement, and transcatheter aortic valve replacement. We discussed treatment options in the context of the patient's specific comorbid medical conditions.   She would like to proceed with planning for TAVR. I think the obtuse marginal stenosis is severe. I will arrange a PCI of the obtuse marginal branch at [facility] on 06/03/19.  Risks and benefits of the PCI procedure and the valve procedure are reviewed with the patient. After the PCI, she will have a cardiac CT, CTA of the chest/abdomen and pelvis, carotid artery dopplers, PT assessment and will then be referred to see one of the CT surgeons on our TAVR team. I would anticipate that she may need alternative access for TAVR given recent findings during her cardiac cath but this will be better evaluated with the CTA.  -I will load Plavix today in anticipation of the PCI and then continue Plavix 75 mg daily -Pre-cath labs today   Current medicines are reviewed at length with the patient today.  The patient does not have concerns regarding medicines.  The following changes have been made:  no change  Labs/ tests ordered today include:   Orders Placed This Encounter  Procedures  . Basic Metabolic Panel (BMET)  . CBC w/Diff     Disposition:   FU with the valve team.     Signed, Lauree Chandler, MD 05/30/2019 1:37 PM    [address], [place], [place]  [address] Phone: [phone]; Fax: [phone]

## 2019-05-30 NOTE — Patient Instructions (Addendum)
New Medication Instructions: 1) START PLAVIX 75 mg daily. Take 4 pills (300 mg) for your first dose ONLY.  COVID SCREENING INFORMATION: You are scheduled for your COVID screening on Monday, June 01, 2020 between 10:30AM and 12:30PM. [id number]  HEART CATHETERIZATION INSTRUCTIONS: You are scheduled for a HEART CATHETERIZATION on Thursday, June 04, 2020 with Dr. Lauree Chandler.  1. Please arrive at the [facility] (Main Entrance A) at [facility]: [place], [place] [address] at (This time is two hours before your procedure to ensure your preparation). You are allowed ONE visitor in the waiting room during the procedure. Both you and your visitor must wear masks. Special note: Every effort is made to have your procedure done on time. Please understand that emergencies sometimes delay scheduled procedures.  2. Diet: Do not eat solid foods after midnight.  You may have clear liquids until 5am upon the day of the procedure.  3. Labs: Pre-procedure labs will be drawn TODAY.  4. Medication instructions in preparation for your procedure:  1) HOLD LASIX the morning of your procedure.  2) MAKE SURE TO TAKE ASPIRIN 81 AND PLAVIX mg the morning of your procedure.  5. Plan for one night stay--bring personal belongings. 6. Bring a current list of your medications and current insurance cards. 7. You MUST have a responsible person to drive you home. 8. Someone MUST be with you the first 24 hours after you arrive home or your discharge will be delayed. 9. Please wear clothes that are easy to get on and off and wear slip-on shoes.  Thank you for allowing [place] to care for you!   --  Invasive Cardiovascular services

## 2019-05-31 LAB — CBC WITH DIFFERENTIAL/PLATELET
Basophils Absolute: 0 10*3/uL (ref 0.0–0.2)
Basos: 0 %
EOS (ABSOLUTE): 0.1 10*3/uL (ref 0.0–0.4)
Eos: 2 %
Hematocrit: 41.4 % (ref 34.0–46.6)
Hemoglobin: 14.1 g/dL (ref 11.1–15.9)
Immature Grans (Abs): 0 10*3/uL (ref 0.0–0.1)
Immature Granulocytes: 0 %
Lymphocytes Absolute: 2.1 10*3/uL (ref 0.7–3.1)
Lymphs: 26 %
MCH: 32.3 pg (ref 26.6–33.0)
MCHC: 34.1 g/dL (ref 31.5–35.7)
MCV: 95 fL (ref 79–97)
Monocytes Absolute: 0.8 10*3/uL (ref 0.1–0.9)
Monocytes: 10 %
Neutrophils Absolute: 5.1 10*3/uL (ref 1.4–7.0)
Neutrophils: 62 %
Platelets: 228 10*3/uL (ref 150–450)
RBC: 4.36 x10E6/uL (ref 3.77–5.28)
RDW: 12.5 % (ref 11.7–15.4)
WBC: 8.2 10*3/uL (ref 3.4–10.8)

## 2019-05-31 LAB — BASIC METABOLIC PANEL
BUN/Creatinine Ratio: 16 (ref 12–28)
BUN: 13 mg/dL (ref 8–27)
CO2: 25 mmol/L (ref 20–29)
Calcium: 10 mg/dL (ref 8.7–10.3)
Chloride: 101 mmol/L (ref 96–106)
Creatinine, Ser: 0.83 mg/dL (ref 0.57–1.00)
GFR calc Af Amer: 78 mL/min/{1.73_m2} (ref >59)
GFR calc non Af Amer: 68 mL/min/{1.73_m2} (ref >59)
Glucose: 96 mg/dL (ref 65–99)
Potassium: 4.4 mmol/L (ref 3.5–5.2)
Sodium: 142 mmol/L (ref 134–144)

## 2019-06-02 ENCOUNTER — Other Ambulatory Visit
Admission: RE | Admit: 2019-06-02 | Discharge: 2019-06-02 | Disposition: A | Discharge status: Home / Self Care | Payer: Medicare Other | Source: Ambulatory Visit | Attending: Cardiovascular Disease | Admitting: Cardiovascular Disease

## 2019-06-02 ENCOUNTER — Other Ambulatory Visit: Payer: Self-pay

## 2019-06-02 DIAGNOSIS — Z01812 Encounter for preprocedural laboratory examination: Secondary | ICD-10-CM | POA: Diagnosis not present

## 2019-06-02 DIAGNOSIS — Z20828 Contact with and (suspected) exposure to other viral communicable diseases: Secondary | ICD-10-CM | POA: Insufficient documentation

## 2019-06-02 LAB — SARS CORONAVIRUS 2 (TAT 6-24 HRS): SARS Coronavirus 2: NEGATIVE

## 2019-06-04 ENCOUNTER — Telehealth: Payer: Self-pay | Admitting: *Deleted

## 2019-06-04 NOTE — Telephone Encounter (Signed)
Pt contacted pre-catheterization scheduled at [facility] for: Thursday June 05, 2019 9 AM Verified arrival time and place: [facility]) at: 7 AM   No solid food after midnight prior to cath, clear liquids until 5 AM day of procedure. Contrast allergy: no  Hold: Lasix/KCl-AM of procedure.  Except hold medications AM meds can be  taken pre-cath with sip of water including: ASA 81 mg Plavix 75 mg  Confirmed patient has responsible adult to drive home post procedure and observe 24 hours after arriving home: yes  Currently, due to Covid-19 pandemic, only one support person will be allowed with patient. Must be the same support person for that patient's entire stay, will be screened and required to wear a mask. They will be asked to wait in the waiting room for the duration of the patient's stay.  Patients are required to wear a mask when they enter the hospital.     COVID-19 Pre-Screening Questions:  . In the past 7 to 10 days have you had a cough,  shortness of breath, headache, congestion, fever (100 or greater) body aches, chills, sore throat, or sudden loss of taste or sense of smell? no . Have you been around anyone with known Covid 19? no . Have you been around anyone who is awaiting Covid 19 test results in the past 7 to 10 days? no . Have you been around anyone who has been exposed to Covid 19, or has mentioned symptoms of Covid 19 within the past 7 to 10 days? no   I reviewed procedure/mask/visitor instructions, Covid-19 screening questions with patient, she verbalized understanding, thanked me for call.

## 2019-06-05 ENCOUNTER — Inpatient Hospital Stay (HOSPITAL_COMMUNITY)
Admission: AD | Disposition: A | Discharge status: Home / Self Care | Payer: Self-pay | Source: Home / Self Care | Attending: Cardiovascular Disease

## 2019-06-05 ENCOUNTER — Inpatient Hospital Stay (HOSPITAL_COMMUNITY)
Admission: AD | Admit: 2019-06-05 | Discharge: 2019-06-07 | DRG: 247 | Disposition: A | Discharge status: Home / Self Care | Payer: Medicare Other | Attending: Cardiovascular Disease | Admitting: Cardiovascular Disease

## 2019-06-05 ENCOUNTER — Other Ambulatory Visit: Payer: Self-pay

## 2019-06-05 ENCOUNTER — Encounter (HOSPITAL_COMMUNITY): Payer: Medicare Other

## 2019-06-05 ENCOUNTER — Ambulatory Visit (HOSPITAL_COMMUNITY): Payer: Medicare Other

## 2019-06-05 DIAGNOSIS — Z79890 Hormone replacement therapy (postmenopausal): Secondary | ICD-10-CM

## 2019-06-05 DIAGNOSIS — E785 Hyperlipidemia, unspecified: Secondary | ICD-10-CM | POA: Diagnosis not present

## 2019-06-05 DIAGNOSIS — I25110 Atherosclerotic heart disease of native coronary artery with unstable angina pectoris: Secondary | ICD-10-CM | POA: Diagnosis not present

## 2019-06-05 DIAGNOSIS — I350 Nonrheumatic aortic (valve) stenosis: Secondary | ICD-10-CM | POA: Diagnosis not present

## 2019-06-05 DIAGNOSIS — Z8673 Personal history of transient ischemic attack (TIA), and cerebral infarction without residual deficits: Secondary | ICD-10-CM

## 2019-06-05 DIAGNOSIS — I2584 Coronary atherosclerosis due to calcified coronary lesion: Secondary | ICD-10-CM | POA: Diagnosis not present

## 2019-06-05 DIAGNOSIS — Z825 Family history of asthma and other chronic lower respiratory diseases: Secondary | ICD-10-CM | POA: Diagnosis not present

## 2019-06-05 DIAGNOSIS — I4510 Unspecified right bundle-branch block: Secondary | ICD-10-CM | POA: Diagnosis not present

## 2019-06-05 DIAGNOSIS — Z7982 Long term (current) use of aspirin: Secondary | ICD-10-CM | POA: Diagnosis not present

## 2019-06-05 DIAGNOSIS — Z8349 Family history of other endocrine, nutritional and metabolic diseases: Secondary | ICD-10-CM

## 2019-06-05 DIAGNOSIS — I70201 Unspecified atherosclerosis of native arteries of extremities, right leg: Secondary | ICD-10-CM | POA: Diagnosis present

## 2019-06-05 DIAGNOSIS — I739 Peripheral vascular disease, unspecified: Secondary | ICD-10-CM | POA: Diagnosis not present

## 2019-06-05 DIAGNOSIS — I080 Rheumatic disorders of both mitral and aortic valves: Secondary | ICD-10-CM | POA: Diagnosis present

## 2019-06-05 DIAGNOSIS — I10 Essential (primary) hypertension: Secondary | ICD-10-CM | POA: Diagnosis present

## 2019-06-05 DIAGNOSIS — Z8249 Family history of ischemic heart disease and other diseases of the circulatory system: Secondary | ICD-10-CM

## 2019-06-05 DIAGNOSIS — I25118 Atherosclerotic heart disease of native coronary artery with other forms of angina pectoris: Secondary | ICD-10-CM

## 2019-06-05 DIAGNOSIS — Z79899 Other long term (current) drug therapy: Secondary | ICD-10-CM

## 2019-06-05 DIAGNOSIS — I200 Unstable angina: Secondary | ICD-10-CM

## 2019-06-05 DIAGNOSIS — Z7902 Long term (current) use of antithrombotics/antiplatelets: Secondary | ICD-10-CM

## 2019-06-05 DIAGNOSIS — Y712 Prosthetic and other implants, materials and accessory cardiovascular devices associated with adverse incidents: Secondary | ICD-10-CM | POA: Diagnosis present

## 2019-06-05 DIAGNOSIS — I708 Atherosclerosis of other arteries: Secondary | ICD-10-CM | POA: Diagnosis present

## 2019-06-05 DIAGNOSIS — Z5309 Procedure and treatment not carried out because of other contraindication: Secondary | ICD-10-CM | POA: Diagnosis not present

## 2019-06-05 DIAGNOSIS — T82855A Stenosis of coronary artery stent, initial encounter: Secondary | ICD-10-CM | POA: Diagnosis present

## 2019-06-05 DIAGNOSIS — E039 Hypothyroidism, unspecified: Secondary | ICD-10-CM | POA: Diagnosis not present

## 2019-06-05 DIAGNOSIS — Z87891 Personal history of nicotine dependence: Secondary | ICD-10-CM

## 2019-06-05 HISTORY — PX: CORONARY ANGIOGRAPHY: CATH118303

## 2019-06-05 LAB — POCT ACTIVATED CLOTTING TIME
Activated Clotting Time: 378 seconds
Activated Clotting Time: 268 seconds
Activated Clotting Time: 208 seconds
Activated Clotting Time: 224 seconds
Activated Clotting Time: 191 seconds
Activated Clotting Time: 164 seconds

## 2019-06-05 SURGERY — CORONARY ANGIOGRAPHY (CATH LAB)
Anesthesia: LOCAL

## 2019-06-05 MED ORDER — FENTANYL CITRATE (PF) 100 MCG/2ML IJ SOLN
INTRAMUSCULAR | Status: DC | PRN
  Administered 2019-06-05: 50 ug via INTRAVENOUS
  Administered 2019-06-05 (×3): 25 ug via INTRAVENOUS

## 2019-06-05 MED ORDER — HYDRALAZINE HCL 20 MG/ML IJ SOLN
INTRAMUSCULAR | Status: DC | PRN
  Administered 2019-06-05: 10 mg via INTRAVENOUS

## 2019-06-05 MED ORDER — LOSARTAN POTASSIUM 50 MG PO TABS
100.0000 mg | ORAL_TABLET | Freq: Every day | ORAL | Status: DC
  Administered 2019-06-06 – 2019-06-07 (×2): 100 mg via ORAL
  Filled 2019-06-05 (×2): qty 2

## 2019-06-05 MED ORDER — VERAPAMIL HCL 2.5 MG/ML IV SOLN
INTRAVENOUS | Status: AC
  Filled 2019-06-05: qty 2

## 2019-06-05 MED ORDER — CLONIDINE HCL 0.1 MG PO TABS
0.1000 mg | ORAL_TABLET | Freq: Two times a day (BID) | ORAL | Status: DC
  Administered 2019-06-05 – 2019-06-07 (×4): 0.1 mg via ORAL
  Filled 2019-06-05 (×4): qty 1

## 2019-06-05 MED ORDER — SODIUM CHLORIDE 0.9 % IV SOLN: 250.0000 mL | INTRAVENOUS | Status: DC | PRN

## 2019-06-05 MED ORDER — SODIUM CHLORIDE 0.9% FLUSH: 3.0000 mL | INTRAVENOUS | Status: DC | PRN

## 2019-06-05 MED ORDER — FUROSEMIDE 20 MG PO TABS
20.0000 mg | ORAL_TABLET | Freq: Every day | ORAL | Status: DC
  Administered 2019-06-06 – 2019-06-07 (×2): 20 mg via ORAL
  Filled 2019-06-05 (×2): qty 1

## 2019-06-05 MED ORDER — HYDRALAZINE HCL 20 MG/ML IJ SOLN: 10.0000 mg | INTRAMUSCULAR | Status: AC | PRN

## 2019-06-05 MED ORDER — LEVOTHYROXINE SODIUM 25 MCG PO TABS
25.0000 ug | ORAL_TABLET | Freq: Every day | ORAL | Status: DC
  Administered 2019-06-06 – 2019-06-07 (×2): 25 ug via ORAL
  Filled 2019-06-05 (×2): qty 1

## 2019-06-05 MED ORDER — MIDAZOLAM HCL 2 MG/2ML IJ SOLN
INTRAMUSCULAR | Status: DC | PRN
  Administered 2019-06-05 (×3): 1 mg via INTRAVENOUS

## 2019-06-05 MED ORDER — SODIUM CHLORIDE 0.9% FLUSH: 3.0000 mL | Freq: Two times a day (BID) | INTRAVENOUS | Status: DC

## 2019-06-05 MED ORDER — ATORVASTATIN CALCIUM 80 MG PO TABS
80.0000 mg | ORAL_TABLET | Freq: Every day | ORAL | Status: DC
  Administered 2019-06-06: 80 mg via ORAL
  Filled 2019-06-05: qty 1

## 2019-06-05 MED ORDER — SODIUM CHLORIDE 0.9% FLUSH
3.0000 mL | Freq: Two times a day (BID) | INTRAVENOUS | Status: DC
  Administered 2019-06-05 – 2019-06-07 (×4): 3 mL via INTRAVENOUS

## 2019-06-05 MED ORDER — LIDOCAINE HCL (PF) 1 % IJ SOLN
INTRAMUSCULAR | Status: DC | PRN
  Administered 2019-06-05: 2 mL
  Administered 2019-06-05: 16 mL

## 2019-06-05 MED ORDER — ATROPINE SULFATE 1 MG/10ML IJ SOSY
PREFILLED_SYRINGE | INTRAMUSCULAR | Status: AC
  Filled 2019-06-05: qty 10

## 2019-06-05 MED ORDER — MIDAZOLAM HCL 2 MG/2ML IJ SOLN
INTRAMUSCULAR | Status: AC
  Filled 2019-06-05: qty 2

## 2019-06-05 MED ORDER — HEPARIN SODIUM (PORCINE) 1000 UNIT/ML IJ SOLN
INTRAMUSCULAR | Status: DC | PRN
  Administered 2019-06-05: 10000 [IU] via INTRAVENOUS

## 2019-06-05 MED ORDER — METOPROLOL TARTRATE 12.5 MG HALF TABLET
12.5000 mg | ORAL_TABLET | Freq: Two times a day (BID) | ORAL | Status: DC
  Administered 2019-06-05 – 2019-06-07 (×4): 12.5 mg via ORAL
  Filled 2019-06-05 (×4): qty 1

## 2019-06-05 MED ORDER — NITROGLYCERIN 0.4 MG SL SUBL: 0.4000 mg | SUBLINGUAL_TABLET | SUBLINGUAL | Status: DC | PRN

## 2019-06-05 MED ORDER — SODIUM CHLORIDE 0.9 % IV SOLN: INTRAVENOUS | Status: AC

## 2019-06-05 MED ORDER — ONDANSETRON HCL 4 MG/2ML IJ SOLN: 4.0000 mg | Freq: Four times a day (QID) | INTRAMUSCULAR | Status: DC | PRN

## 2019-06-05 MED ORDER — HEPARIN (PORCINE) IN NACL 1000-0.9 UT/500ML-% IV SOLN
INTRAVENOUS | Status: DC | PRN
  Administered 2019-06-05 (×2): 500 mL

## 2019-06-05 MED ORDER — SODIUM CHLORIDE 0.9 % WEIGHT BASED INFUSION
3.0000 mL/kg/h | INTRAVENOUS | Status: AC
  Administered 2019-06-06: 3 mL/kg/h via INTRAVENOUS

## 2019-06-05 MED ORDER — ASPIRIN 81 MG PO CHEW: 81.0000 mg | CHEWABLE_TABLET | ORAL | Status: DC

## 2019-06-05 MED ORDER — SODIUM CHLORIDE 0.9 % WEIGHT BASED INFUSION: 1.0000 mL/kg/h | INTRAVENOUS | Status: DC

## 2019-06-05 MED ORDER — ISOSORBIDE MONONITRATE ER 30 MG PO TB24
30.0000 mg | ORAL_TABLET | Freq: Two times a day (BID) | ORAL | Status: DC
  Administered 2019-06-05 – 2019-06-07 (×4): 30 mg via ORAL
  Filled 2019-06-05 (×4): qty 1

## 2019-06-05 MED ORDER — FENTANYL CITRATE (PF) 100 MCG/2ML IJ SOLN
INTRAMUSCULAR | Status: AC
  Filled 2019-06-05: qty 2

## 2019-06-05 MED ORDER — LABETALOL HCL 5 MG/ML IV SOLN: 10.0000 mg | INTRAVENOUS | Status: AC | PRN

## 2019-06-05 MED ORDER — HEPARIN SODIUM (PORCINE) 1000 UNIT/ML IJ SOLN
INTRAMUSCULAR | Status: AC
  Filled 2019-06-05: qty 1

## 2019-06-05 MED ORDER — ACETAMINOPHEN 325 MG PO TABS
650.0000 mg | ORAL_TABLET | ORAL | Status: DC | PRN
  Administered 2019-06-06: 650 mg via ORAL
  Filled 2019-06-05: qty 2

## 2019-06-05 MED ORDER — IOHEXOL 350 MG/ML SOLN
INTRAVENOUS | Status: DC | PRN
  Administered 2019-06-05: 55 mL

## 2019-06-05 MED ORDER — VERAPAMIL HCL 2.5 MG/ML IV SOLN
INTRAVENOUS | Status: DC | PRN
  Administered 2019-06-05: 10 mL via INTRA_ARTERIAL

## 2019-06-05 MED ORDER — SODIUM CHLORIDE 0.9 % WEIGHT BASED INFUSION
3.0000 mL/kg/h | INTRAVENOUS | Status: DC
  Administered 2019-06-05: 3 mL/kg/h via INTRAVENOUS

## 2019-06-05 MED ORDER — ASPIRIN 81 MG PO CHEW
81.0000 mg | CHEWABLE_TABLET | Freq: Every day | ORAL | Status: DC
  Administered 2019-06-06 – 2019-06-07 (×2): 81 mg via ORAL
  Filled 2019-06-05 (×3): qty 1

## 2019-06-05 MED ORDER — HEPARIN (PORCINE) IN NACL 1000-0.9 UT/500ML-% IV SOLN
INTRAVENOUS | Status: AC
  Filled 2019-06-05: qty 1000

## 2019-06-05 MED ORDER — LIDOCAINE HCL (PF) 1 % IJ SOLN
INTRAMUSCULAR | Status: AC
  Filled 2019-06-05: qty 30

## 2019-06-05 MED ORDER — CLOPIDOGREL BISULFATE 75 MG PO TABS
75.0000 mg | ORAL_TABLET | Freq: Every day | ORAL | Status: DC
  Administered 2019-06-06 – 2019-06-07 (×2): 75 mg via ORAL
  Filled 2019-06-05 (×3): qty 1

## 2019-06-05 SURGICAL SUPPLY — 21 items
BALLN SAPPHIRE 1.0X10 (BALLOONS) ×2
BALLN SAPPHIRE 2.0X12 (BALLOONS) ×2
BALLOON SAPPHIRE 1.0X10 (BALLOONS) ×1 IMPLANT
BALLOON SAPPHIRE 2.0X12 (BALLOONS) ×1 IMPLANT
CATH INFINITI JR4 5F (CATHETERS) ×2 IMPLANT
CATH VISTA GUIDE 6FR XB3 (CATHETERS) ×2 IMPLANT
DEVICE RAD COMP TR BAND LRG (VASCULAR PRODUCTS) ×2 IMPLANT
GLIDESHEATH SLEND SS 6F .021 (SHEATH) ×2 IMPLANT
GUIDEWIRE INQWIRE 1.5J.035X260 (WIRE) ×1 IMPLANT
INQWIRE 1.5J .035X260CM (WIRE) ×2
KIT ENCORE 26 ADVANTAGE (KITS) ×2 IMPLANT
KIT HEART LEFT (KITS) ×2 IMPLANT
PACK CARDIAC CATHETERIZATION (CUSTOM PROCEDURE TRAY) ×2 IMPLANT
PINNACLE LONG 6F 25CM (SHEATH) ×2
SHEATH INTRO PINNACLE 6F 25CM (SHEATH) ×1 IMPLANT
SHEATH PINNACLE 6F 10CM (SHEATH) ×2 IMPLANT
SHEATH PROBE COVER 6X72 (BAG) ×2 IMPLANT
TRANSDUCER W/STOPCOCK (MISCELLANEOUS) ×2 IMPLANT
TUBING CIL FLEX 10 FLL-RA (TUBING) ×2 IMPLANT
WIRE COUGAR XT STRL 190CM (WIRE) ×2 IMPLANT
WIRE EMERALD 3MM-J .035X150CM (WIRE) ×2 IMPLANT

## 2019-06-05 NOTE — Interval H&P Note (Signed)
History and Physical Interval Note:  06/05/2019 8:57 AM  [facility]ia [facility]  has presented today for surgery, with the diagnosis of CAD, Angina.  The various methods of treatment have been discussed with the patient and family. After consideration of risks, benefits and other options for treatment, the patient has consented to  Procedure(s): CORONARY STENT INTERVENTION (N/A) as a surgical intervention.  The patient's history has been reviewed, patient examined, no change in status, stable for surgery.  I have reviewed the patient's chart and labs.  Questions were answered to the patient's satisfaction.     Cath Lab Visit (complete for each Cath Lab visit)  Clinical Evaluation Leading to the Procedure:   ACS: No.  Non-ACS:    Anginal Classification: CCS III  Anti-ischemic medical therapy: Maximal Therapy (2 or more classes of medications)  Non-Invasive Test Results: No non-invasive testing performed  Prior CABG: No previous CABG        Lauree Chandler

## 2019-06-05 NOTE — Progress Notes (Addendum)
Site area: left groin fa sheath Site Prior to Removal:  Level 0 Pressure Applied For: 30 minutes Manual:   yes Patient Status During Pull:  Vageled; 0.5mg  atropine IV given; 200 cc 0.9NS bolus Post Pull Site:  Level 0 Post Pull Instructions Given:  yes Post Pull Pulses Present:  Left dp dopplered Dressing Applied:  Gauze and tegaderm Bedrest begins @ 1500 Comments:

## 2019-06-06 ENCOUNTER — Encounter (HOSPITAL_COMMUNITY)
Admission: AD | Disposition: A | Discharge status: Home / Self Care | Payer: Self-pay | Source: Home / Self Care | Attending: Cardiovascular Disease

## 2019-06-06 ENCOUNTER — Encounter (HOSPITAL_COMMUNITY): Payer: Self-pay | Admitting: Cardiovascular Disease

## 2019-06-06 DIAGNOSIS — I25110 Atherosclerotic heart disease of native coronary artery with unstable angina pectoris: Principal | ICD-10-CM

## 2019-06-06 DIAGNOSIS — E785 Hyperlipidemia, unspecified: Secondary | ICD-10-CM

## 2019-06-06 DIAGNOSIS — I350 Nonrheumatic aortic (valve) stenosis: Secondary | ICD-10-CM

## 2019-06-06 DIAGNOSIS — I739 Peripheral vascular disease, unspecified: Secondary | ICD-10-CM

## 2019-06-06 DIAGNOSIS — I25118 Atherosclerotic heart disease of native coronary artery with other forms of angina pectoris: Secondary | ICD-10-CM

## 2019-06-06 HISTORY — PX: CORONARY ATHERECTOMY: CATH118238

## 2019-06-06 LAB — CBC
HCT: 35.7 % — ABNORMAL LOW (ref 36.0–46.0)
Hemoglobin: 11.8 g/dL — ABNORMAL LOW (ref 12.0–15.0)
MCH: 32.9 pg (ref 26.0–34.0)
MCHC: 33.1 g/dL (ref 30.0–36.0)
MCV: 99.4 fL (ref 80.0–100.0)
Platelets: 217 10*3/uL (ref 150–400)
RBC: 3.59 MIL/uL — ABNORMAL LOW (ref 3.87–5.11)
RDW: 14.3 % (ref 11.5–15.5)
WBC: 7.5 10*3/uL (ref 4.0–10.5)
nRBC: 0 % (ref 0.0–0.2)

## 2019-06-06 LAB — BASIC METABOLIC PANEL
Anion gap: 9 (ref 5–15)
BUN: 11 mg/dL (ref 8–23)
CO2: 24 mmol/L (ref 22–32)
Calcium: 8.6 mg/dL — ABNORMAL LOW (ref 8.9–10.3)
Chloride: 108 mmol/L (ref 98–111)
Creatinine, Ser: 0.86 mg/dL (ref 0.44–1.00)
GFR calc Af Amer: >60 mL/min (ref >60)
GFR calc non Af Amer: >60 mL/min (ref >60)
Glucose, Bld: 96 mg/dL (ref 70–99)
Potassium: 3.9 mmol/L (ref 3.5–5.1)
Sodium: 141 mmol/L (ref 135–145)

## 2019-06-06 LAB — POCT ACTIVATED CLOTTING TIME
Activated Clotting Time: 279 seconds
Activated Clotting Time: 367 seconds
Activated Clotting Time: >1000 seconds

## 2019-06-06 SURGERY — CORONARY ATHERECTOMY
Anesthesia: LOCAL

## 2019-06-06 MED ORDER — LIDOCAINE HCL (PF) 1 % IJ SOLN
INTRAMUSCULAR | Status: DC | PRN
  Administered 2019-06-06: 2 mL via INTRADERMAL

## 2019-06-06 MED ORDER — NITROGLYCERIN 1 MG/10 ML FOR IR/CATH LAB
INTRA_ARTERIAL | Status: DC | PRN
  Administered 2019-06-06 (×3): 200 ug via INTRACORONARY

## 2019-06-06 MED ORDER — FENTANYL CITRATE (PF) 100 MCG/2ML IJ SOLN
INTRAMUSCULAR | Status: DC | PRN
  Administered 2019-06-06 (×4): 25 ug via INTRAVENOUS

## 2019-06-06 MED ORDER — HEPARIN SODIUM (PORCINE) 1000 UNIT/ML IJ SOLN
INTRAMUSCULAR | Status: DC | PRN
  Administered 2019-06-06: 2000 [IU] via INTRAVENOUS
  Administered 2019-06-06: 8000 [IU] via INTRAVENOUS

## 2019-06-06 MED ORDER — IOHEXOL 350 MG/ML SOLN
INTRAVENOUS | Status: DC | PRN
  Administered 2019-06-06: 125 mL via INTRA_ARTERIAL

## 2019-06-06 MED ORDER — HEPARIN (PORCINE) IN NACL 1000-0.9 UT/500ML-% IV SOLN
INTRAVENOUS | Status: AC
  Filled 2019-06-06: qty 1000

## 2019-06-06 MED ORDER — MIDAZOLAM HCL 2 MG/2ML IJ SOLN
INTRAMUSCULAR | Status: DC | PRN
  Administered 2019-06-06 (×3): 1 mg via INTRAVENOUS

## 2019-06-06 MED ORDER — MIDAZOLAM HCL 2 MG/2ML IJ SOLN
INTRAMUSCULAR | Status: AC
  Filled 2019-06-06: qty 2

## 2019-06-06 MED ORDER — FENTANYL CITRATE (PF) 100 MCG/2ML IJ SOLN
INTRAMUSCULAR | Status: AC
  Filled 2019-06-06: qty 2

## 2019-06-06 MED ORDER — LIDOCAINE HCL (PF) 1 % IJ SOLN
INTRAMUSCULAR | Status: AC
  Filled 2019-06-06: qty 30

## 2019-06-06 MED ORDER — NITROGLYCERIN 1 MG/10 ML FOR IR/CATH LAB
INTRA_ARTERIAL | Status: AC
  Filled 2019-06-06: qty 10

## 2019-06-06 MED ORDER — HEPARIN SODIUM (PORCINE) 1000 UNIT/ML IJ SOLN
INTRAMUSCULAR | Status: AC
  Filled 2019-06-06: qty 1

## 2019-06-06 MED ORDER — HEPARIN (PORCINE) IN NACL 1000-0.9 UT/500ML-% IV SOLN
INTRAVENOUS | Status: DC | PRN
  Administered 2019-06-06 (×2): 500 mL

## 2019-06-06 MED ORDER — VERAPAMIL HCL 2.5 MG/ML IV SOLN
INTRAVENOUS | Status: AC
  Filled 2019-06-06: qty 2

## 2019-06-06 MED ORDER — VERAPAMIL HCL 2.5 MG/ML IV SOLN
INTRAVENOUS | Status: DC | PRN
  Administered 2019-06-06: 10 mL via INTRA_ARTERIAL

## 2019-06-06 MED ORDER — VIPERSLIDE LUBRICANT OPTIME
TOPICAL | Status: DC | PRN
  Administered 2019-06-06: 14:00:00 via SURGICAL_CAVITY

## 2019-06-06 MED FILL — Atropine Sulfate Soln Prefill Syr 1 MG/10ML (0.1 MG/ML): INTRAMUSCULAR | Qty: 10 | Status: AC

## 2019-06-06 SURGICAL SUPPLY — 28 items
BALLN SAPPHIRE 2.5X12 (BALLOONS) ×2
BALLN SAPPHIRE ~~LOC~~ 3.0X8 (BALLOONS) ×1 IMPLANT
BALLN SAPPHIRE ~~LOC~~ 3.75X8 (BALLOONS) ×1 IMPLANT
BALLOON SAPPHIRE 2.5X12 (BALLOONS) IMPLANT
CATH LAUNCHER 6FR EBU3.5 (CATHETERS) ×1 IMPLANT
CATH TELEPORT (CATHETERS) ×1 IMPLANT
CROWN DIAMONDBACK CLASSIC 1.25 (BURR) ×1 IMPLANT
DEVICE RAD COMP TR BAND LRG (VASCULAR PRODUCTS) ×1 IMPLANT
ELECT DEFIB PAD ADLT CADENCE (PAD) ×1 IMPLANT
GLIDESHEATH SLEND SS 6F .021 (SHEATH) ×1 IMPLANT
GUIDEWIRE INQWIRE 1.5J.035X260 (WIRE) IMPLANT
INQWIRE 1.5J .035X260CM (WIRE) ×2
KIT ENCORE 26 ADVANTAGE (KITS) ×1 IMPLANT
KIT HEART LEFT (KITS) ×2 IMPLANT
KIT HEMO VALVE WATCHDOG (MISCELLANEOUS) ×1 IMPLANT
LUBRICANT VIPERSLIDE CORONARY (MISCELLANEOUS) ×1 IMPLANT
PACK CARDIAC CATHETERIZATION (CUSTOM PROCEDURE TRAY) ×2 IMPLANT
PINNACLE LONG 6F 25CM (SHEATH)
SHEATH INTRO PINNACLE 6F 25CM (SHEATH) IMPLANT
SHEATH PROBE COVER 6X72 (BAG) ×1 IMPLANT
STENT RESOLUTE ONYX 2.5X18 (Permanent Stent) ×1 IMPLANT
STENT RESOLUTE ONYX 2.75X12 (Permanent Stent) ×1 IMPLANT
STENT RESOLUTE ONYX 3.5X12 (Permanent Stent) ×1 IMPLANT
TRANSDUCER W/STOPCOCK (MISCELLANEOUS) ×2 IMPLANT
TUBING CIL FLEX 10 FLL-RA (TUBING) ×2 IMPLANT
WIRE ASAHI PROWATER 180CM (WIRE) ×2 IMPLANT
WIRE EMERALD 3MM-J .035X150CM (WIRE) ×1 IMPLANT
WIRE VIPERWIRE COR FLEX .012 (WIRE) ×2 IMPLANT

## 2019-06-06 NOTE — Interval H&P Note (Signed)
History and Physical Interval Note:  06/06/2019 1:14 PM  Victoria Burns  has presented today for surgery, with the diagnosis of Coronary artery disease.  The various methods of treatment have been discussed with the patient and family. After consideration of risks, benefits and other options for treatment, the patient has consented to  Procedure(s): CORONARY ATHERECTOMY (N/A) as a surgical intervention.  The patient's history has been reviewed, patient examined, no change in status, stable for surgery.  I have reviewed the patient's chart and labs.  Questions were answered to the patient's satisfaction.    Cath Lab Visit (complete for each Cath Lab visit)  Clinical Evaluation Leading to the Procedure:   ACS: No.  Non-ACS:    Anginal Classification: CCS III  Anti-ischemic medical therapy: Maximal Therapy (2 or more classes of medications)  Non-Invasive Test Results: No non-invasive testing performed  Prior CABG: No previous CABG       [facility] 06/06/2019 1:14 PM

## 2019-06-06 NOTE — Plan of Care (Signed)
  Problem: Clinical Measurements: Goal: Will remain free from infection Outcome: Progressing Note: No s/s of infection noted.   Problem: Pain Managment: Goal: General experience of comfort will improve Outcome: Progressing Note: Denies c/o pain or discomfort.

## 2019-06-06 NOTE — Progress Notes (Addendum)
Progress Note  Patient Name: Victoria Burns Date of Encounter: 06/06/2019  Primary Cardiologist: Victoria Rogue, MD   Subjective   No chest pain overnight.   Inpatient Medications    Scheduled Meds:  aspirin  81 mg Oral Daily   atorvastatin  80 mg Oral q1800   cloNIDine  0.1 mg Oral BID   clopidogrel  75 mg Oral Daily   furosemide  20 mg Oral Daily   isosorbide mononitrate  30 mg Oral BID   levothyroxine  25 mcg Oral QAC breakfast   losartan  100 mg Oral Daily   metoprolol tartrate  12.5 mg Oral BID   sodium chloride flush  3 mL Intravenous Q12H   sodium chloride flush  3 mL Intravenous Q12H   Continuous Infusions:  sodium chloride     sodium chloride     sodium chloride     PRN Meds: sodium chloride, sodium chloride, acetaminophen, nitroGLYCERIN, ondansetron (ZOFRAN) IV, sodium chloride flush, sodium chloride flush   Vital Signs    Vitals:   06/05/19 1732 06/05/19 2001 06/05/19 2058 06/06/19 0422  BP: (!) 131/50 (!) 123/46 (!) 127/52 (!) 143/51  Pulse: 69 66 66 60  Resp:   20   Temp:  98.6 F (37 C)  98.1 F (36.7 C)  TempSrc:  Oral  Oral  SpO2: 96% 96%  98%  Weight:    86.2 kg  Height:        Intake/Output Summary (Last 24 hours) at 06/06/2019 0737 Last data filed at 06/06/2019 0630 Gross per 24 hour  Intake 1082.96 ml  Output --  Net 1082.96 ml   Last 3 Weights 06/06/2019 06/05/2019 05/30/2019  Weight (lbs) 190 lb 1.6 oz 187 lb 190 lb  Weight (kg) 86.229 kg 84.823 kg 86.183 kg      Telemetry    SR - Personally Reviewed  ECG    No new tracing this morning  Physical Exam  Pleasant older WF GEN: No acute distress.   Neck: No JVD Cardiac: RRR, [id number] systolic murmur, no rubs, or gallops.  Respiratory: Clear to auscultation bilaterally. GI: Soft, nontender, non-distended  MS: No edema; No deformity. Right radial cath site stable.  Neuro:  Nonfocal  Psych: Normal affect   Labs    High Sensitivity Troponin:  No results  for input(s): TROPONINIHS in the last 720 hours.    Chemistry Recent Labs  Lab 05/30/19 1258 06/06/19 0310  NA 142 141  K 4.4 3.9  CL 101 108  CO2 25 24  GLUCOSE 96 96  BUN 13 11  CREATININE 0.83 0.86  CALCIUM 10.0 8.6*  GFRNONAA 68 >60  GFRAA 78 >60  ANIONGAP  --  9     Hematology Recent Labs  Lab 05/30/19 1258 06/06/19 0310  WBC 8.2 7.5  RBC 4.36 3.59*  HGB 14.1 11.8*  HCT 41.4 35.7*  MCV 95 99.4  MCH 32.3 32.9  MCHC 34.1 33.1  RDW 12.5 14.3  PLT 228 217    BNPNo results for input(s): BNP, PROBNP in the last 168 hours.   DDimer No results for input(s): DDIMER in the last 168 hours.   Radiology    No results found.  Cardiac Studies   Cath: 06/05/19   1st Mrg-1 lesion is 80% stenosed.  1st Mrg-2 lesion is 60% stenosed.   1. Severe calcified stenosis in the proximal segment of the first obtuse marginal branch. Moderately severe restenosis in the stented segment of the mid body of the  first OM branch 2. Aborted angioplasty of the lesions in the OM branch. Unable to cross the more proximal, calcified lesion with a balloon.   Recommendations:  Will plan staged PCI of the obtuse marginal branch tomorrow. The calcified lesion in the obtuse marginal branch will need to be treated with orbital atherectomy prior to stent placement. We do not have a CSI Diamondback orbital atherectomy device in stock today. We have discussed the case with our rep from the company and we should have a device in later today. Will plan PCI tomorrow of the OM branch with orbital atherectomy. Dr. Martinique will perform the PCI. I have reviewed the case with Dr. Martinique today.   Diagnostic Dominance: Right    Patient Profile     78 y.o. female with PMH of CAD, Carotid artery disease s/p bilateral carotid endarterectomies, hyperlipidemia, HTN, hypothyroidism, RBBB, prior TIA and severe aortic stenosis who was referred to Dr. Angelena Burns for TAVR work up. Underwent cardiac cath yesterday  noted above with plans for staged intervention today.   Assessment & Plan    1. CAD: underwent cath yesterday for TAVR work up with calcified lesion in OM branch. Planned for CSI atherectomy today with Dr. Martinique. Remains on ASA, plavix. No chest pain overnight.   2. Severe AS: referred to Dr. Angelena Burns via Dr. Rockey Situ for work up. Most recent echo on 04/2019 showed moderately severe to severe aortic stenosis with mean gradient of 55mmHg.  Post cath will plan to continue with work up.   3.  HTN: overall readings have been stable with current therapy.   4. HL: on high dose statin therapy.   5. PAD: noted to have 80% ostial right common iliac artery lesion and occluded right SFA on cath 05/23/19. Scheduled for CT scans regarding ongoing TAVR work up on Monday.  For questions or updates, please contact [place] Please consult [email or web address] for contact info under      Signed, Victoria Bellis, NP  06/06/2019, 7:37 AM    Agree with note by Victoria Bellis NP-C  Ms. Victoria Burns has severe aortic stenosis and a obtuse marginal branch with in-stent restenosis and proximal calcification.  Dr. Angelena Burns was not able to cross the origin of the LM branch yesterday even with a 1.5 mm balloon.  Apparently they did not have a CSI device available.  He is scheduled for obtuse marginal branch orbital atherectomy by Dr. Martinique today.  She does have calcified significantly distal to stenosed right subclavian artery stenosis as well as a moderately high-grade ostial right common iliac artery stenosis.  The plan is to revascularize her obtuse marginal branch in preparation for TAVR.   Victoria Burns, M.D., [place], [facility], Victoria Burns [place] [address] [place], [place]  [address]  [phone] 06/06/2019 9:26 AM

## 2019-06-06 NOTE — H&P (View-Only) (Signed)
Progress Note  Patient Name: Victoria Burns Date of Encounter: 06/06/2019  Primary Cardiologist: Ida Rogue, MD   Subjective   No chest pain overnight.   Inpatient Medications    Scheduled Meds:  aspirin  81 mg Oral Daily   atorvastatin  80 mg Oral q1800   cloNIDine  0.1 mg Oral BID   clopidogrel  75 mg Oral Daily   furosemide  20 mg Oral Daily   isosorbide mononitrate  30 mg Oral BID   levothyroxine  25 mcg Oral QAC breakfast   losartan  100 mg Oral Daily   metoprolol tartrate  12.5 mg Oral BID   sodium chloride flush  3 mL Intravenous Q12H   sodium chloride flush  3 mL Intravenous Q12H   Continuous Infusions:  sodium chloride     sodium chloride     sodium chloride     PRN Meds: sodium chloride, sodium chloride, acetaminophen, nitroGLYCERIN, ondansetron (ZOFRAN) IV, sodium chloride flush, sodium chloride flush   Vital Signs    Vitals:   06/05/19 1732 06/05/19 2001 06/05/19 2058 06/06/19 0422  BP: (!) 131/50 (!) 123/46 (!) 127/52 (!) 143/51  Pulse: 69 66 66 60  Resp:   20   Temp:  98.6 F (37 C)  98.1 F (36.7 C)  TempSrc:  Oral  Oral  SpO2: 96% 96%  98%  Weight:    86.2 kg  Height:        Intake/Output Summary (Last 24 hours) at 06/06/2019 0737 Last data filed at 06/06/2019 0630 Gross per 24 hour  Intake 1082.96 ml  Output --  Net 1082.96 ml   Last 3 Weights 06/06/2019 06/05/2019 05/30/2019  Weight (lbs) 190 lb 1.6 oz 187 lb 190 lb  Weight (kg) 86.229 kg 84.823 kg 86.183 kg      Telemetry    SR - Personally Reviewed  ECG    No new tracing this morning  Physical Exam  Pleasant older WF GEN: No acute distress.   Neck: No JVD Cardiac: RRR, [id number] systolic murmur, no rubs, or gallops.  Respiratory: Clear to auscultation bilaterally. GI: Soft, nontender, non-distended  MS: No edema; No deformity. Right radial cath site stable.  Neuro:  Nonfocal  Psych: Normal affect   Labs    High Sensitivity Troponin:  No results  for input(s): TROPONINIHS in the last 720 hours.    Chemistry Recent Labs  Lab 05/30/19 1258 06/06/19 0310  NA 142 141  K 4.4 3.9  CL 101 108  CO2 25 24  GLUCOSE 96 96  BUN 13 11  CREATININE 0.83 0.86  CALCIUM 10.0 8.6*  GFRNONAA 68 >60  GFRAA 78 >60  ANIONGAP  --  9     Hematology Recent Labs  Lab 05/30/19 1258 06/06/19 0310  WBC 8.2 7.5  RBC 4.36 3.59*  HGB 14.1 11.8*  HCT 41.4 35.7*  MCV 95 99.4  MCH 32.3 32.9  MCHC 34.1 33.1  RDW 12.5 14.3  PLT 228 217    BNPNo results for input(s): BNP, PROBNP in the last 168 hours.   DDimer No results for input(s): DDIMER in the last 168 hours.   Radiology    No results found.  Cardiac Studies   Cath: 06/05/19   1st Mrg-1 lesion is 80% stenosed.  1st Mrg-2 lesion is 60% stenosed.   1. Severe calcified stenosis in the proximal segment of the first obtuse marginal branch. Moderately severe restenosis in the stented segment of the mid body of the  first OM branch 2. Aborted angioplasty of the lesions in the OM branch. Unable to cross the more proximal, calcified lesion with a balloon.   Recommendations:  Will plan staged PCI of the obtuse marginal branch tomorrow. The calcified lesion in the obtuse marginal branch will need to be treated with orbital atherectomy prior to stent placement. We do not have a CSI Diamondback orbital atherectomy device in stock today. We have discussed the case with our rep from the company and we should have a device in later today. Will plan PCI tomorrow of the OM branch with orbital atherectomy. Dr. Martinique will perform the PCI. I have reviewed the case with Dr. Martinique today.   Diagnostic Dominance: Right    Patient Profile     78 y.o. female with PMH of CAD, Carotid artery disease s/p bilateral carotid endarterectomies, hyperlipidemia, HTN, hypothyroidism, RBBB, prior TIA and severe aortic stenosis who was referred to Dr. Angelena Form for TAVR work up. Underwent cardiac cath yesterday  noted above with plans for staged intervention today.   Assessment & Plan    1. CAD: underwent cath yesterday for TAVR work up with calcified lesion in OM branch. Planned for CSI atherectomy today with Dr. Martinique. Remains on ASA, plavix. No chest pain overnight.   2. Severe AS: referred to Dr. Angelena Form via Dr. Rockey Situ for work up. Most recent echo on 04/2019 showed moderately severe to severe aortic stenosis with mean gradient of 71mmHg.  Post cath will plan to continue with work up.   3.  HTN: overall readings have been stable with current therapy.   4. HL: on high dose statin therapy.   5. PAD: noted to have 80% ostial right common iliac artery lesion and occluded right SFA on cath 05/23/19. Scheduled for CT scans regarding ongoing TAVR work up on Monday.  For questions or updates, please contact [place] Please consult [email or web address] for contact info under      Signed, Reino Bellis, NP  06/06/2019, 7:37 AM    Agree with note by Reino Bellis NP-C  Victoria Burns has severe aortic stenosis and a obtuse marginal branch with in-stent restenosis and proximal calcification.  Dr. Angelena Form was not able to cross the origin of the LM branch yesterday even with a 1.5 mm balloon.  Apparently they did not have a CSI device available.  He is scheduled for obtuse marginal branch orbital atherectomy by Dr. Martinique today.  She does have calcified significantly distal to stenosed right subclavian artery stenosis as well as a moderately high-grade ostial right common iliac artery stenosis.  The plan is to revascularize her obtuse marginal branch in preparation for TAVR.   Lorretta Harp, M.D., [place], [facility], [place] [address] [place], [place]  [address]  [phone] 06/06/2019 9:26 AM

## 2019-06-07 NOTE — Plan of Care (Signed)
Pt discharged to home with self care, d/c education complete, left facility via private vehicle.

## 2019-06-07 NOTE — Discharge Summary (Signed)
Discharge Summary    Patient ID: Victoria Burns MRN: [id number]; DOB: January 25, 1941  Admit date: 06/05/2019 Discharge date: 06/07/2019  Primary Care Provider: Pleas Koch, NP  Primary Cardiologist: Ida Rogue, MD  Discharge Diagnoses    Active Problems:   Unstable angina [facility])  severe AS   CAD   Carotid artery disease s/p bilateral carotid endarterectomies   Hyperlipidemia   HTN  Diagnostic Studies/Procedures    CORONARY ATHERECTOMY  Conclusion    1st Mrg-2 lesion is 60% stenosed.  A drug-eluting stent was successfully placed using a STENT RESOLUTE ONYX 2.5X18.  Post intervention, there is a 0% residual stenosis.  1st Mrg-1 lesion is 80% stenosed.  A drug-eluting stent was successfully placed using a [place] [id number].  Post intervention, there is a 0% residual stenosis.  Ost Cx to Prox Cx lesion is 75% stenosed.  A drug-eluting stent was successfully placed using a STENT RESOLUTE ONYX 3.5X12.  Post intervention, there is a 0% residual stenosis.   1. Successful orbital atherectomy and stenting of the ostial LCx, proximal OM1 and distal OM1 with DES x3    Diagnostic Dominance: Right  Intervention      History of Present Illness     Victoria Burns is a 78 y.o. female with history of CAD, carotid artery disease s/p bilateral carotid endarterectomies, hyperlipidemia, HTN, hypothyroidism, RBBB, prior TIA and severe aortic stenosis who is undergoing TVAR work up presented for scheduled procedure.   History of CAD with prior stenting in ( 3 in [place], 1 in [place] and 2 in [place]).   Most recent echo 05/06/19 showed normal LVEF with impaired relaxation pattern of LV diastolic filling Moderate to severe aortic valve stenosis with increased peak velocity from prior. She underwent R & L cath 05/23/2019 by Dr. Rockey Situ for evaluation which showed progression of valvular disease and OM disease.   She had cath 06/05/2019  showing severe calcified stenosis in the proximal segment of the first obtuse marginal branch. Moderately severe restenosis in the stented segment of the mid body of the first OM branch. Aborted angioplasty of the lesions in the OM branch as unable to cross the more proximal, calcified lesion with a balloon.   Hospital Course     Consultants: None  She presented for scheduled arterectomy and had Successful orbital atherectomy and stenting of the ostial LCx, proximal OM1 and distal OM1 with DES x3. No complications. ambulated well. Cath site stable. Renal function normal. TVAR work up as schedule as outpatient. No change in home medications.    Did the patient have an acute coronary syndrome (MI, NSTEMI, STEMI, etc) this admission?:  No                               Did the patient have a percutaneous coronary intervention (stent / angioplasty)?:  Yes.     Cath/PCI Registry Performance & Quality Measures: 1. Aspirin prescribed? - Yes 2. ADP Receptor Inhibitor (Plavix/Clopidogrel, Brilinta/Ticagrelor or Effient/Prasugrel) prescribed (includes medically managed patients)? - Yes 3. High Intensity Statin (Lipitor 40-80mg  or Crestor 20-40mg ) prescribed? - Yes 4. For EF <40%, was ACEI/ARB prescribed? - Not Applicable (EF >/= [id number]) 5. For EF <40%, Aldosterone Antagonist (Spironolactone or Eplerenone) prescribed? - Not Applicable (EF >/= [id number]) 6. Cardiac Rehab Phase II ordered (Included Medically managed Patients)? - Yes   _____________  Discharge Vitals Blood pressure (!) 115/56, pulse 63, temperature 98 F (36.7 C),  temperature source Oral, resp. rate 20, height 5\' 1"  (1.549 m), weight 86.6 kg, SpO2 97 %.  Filed Weights   06/05/19 0701 06/06/19 0422 06/07/19 0527  Weight: 84.8 kg 86.2 kg 86.6 kg    Labs & Radiologic Studies    CBC Recent Labs    06/06/19 0310  WBC 7.5  HGB 11.8*  HCT 35.7*  MCV 99.4  PLT [id number]   Basic Metabolic Panel Recent Labs    06/06/19 0310  NA 141  K 3.9   CL 108  CO2 24  GLUCOSE 96  BUN 11  CREATININE 0.86  CALCIUM 8.6*    Disposition   Pt is being discharged home today in good condition.  Follow-up Plans & Appointments    Follow-up Information    TVAR work up as schedule Follow up.          Discharge Instructions    Diet - low sodium heart healthy   Complete by: As directed    Discharge instructions   Complete by: As directed    No driving for 48 hours. No lifting over 5 lbs for 1 week. No sexual activity for 1 week.  Keep procedure site clean & dry. If you notice increased pain, swelling, bleeding or pus, call/return!  You may shower, but no soaking baths/hot tubs/pools for 1 week.   Increase activity slowly   Complete by: As directed       Discharge Medications   Allergies as of 06/07/2019      Reactions   Codeine Rash   Penicillins Rash   Did it involve swelling of the face/tongue/throat, SOB, or low BP? No Did it involve sudden or severe rash/hives, skin peeling, or any reaction on the inside of your mouth or nose? No Did you need to seek medical attention at a hospital or doctor's office? No When did it last happen?over 50 years ago If all above answers are "NO", may proceed with cephalosporin use.      Medication List    TAKE these medications   aspirin 81 MG chewable tablet Chew 81 mg by mouth daily.   atorvastatin 80 MG tablet Commonly known as: LIPITOR Take 1 tablet (80 mg total) by mouth daily at 6 PM.   cloNIDine 0.1 MG tablet Commonly known as: Catapres Take 1 tablet (0.1 mg total) by mouth 2 (two) times daily.   clopidogrel 75 MG tablet Commonly known as: PLAVIX Take 1 tablet (75 mg total) by mouth daily.   FOCUSED MIND PO Take 1 tablet by mouth daily.   furosemide 20 MG tablet Commonly known as: LASIX Take 1 tablet (20 mg total) by mouth daily.   isosorbide mononitrate 30 MG 24 hr tablet Commonly known as: IMDUR Take 1 tablet (30 mg total) by mouth 2 (two) times daily.    levothyroxine 25 MCG tablet Commonly known as: SYNTHROID TAKE 1 TABLET BY MOUTH  EVERY MORNING ON AN EMPTY  STOMACH WITH WATER - NO  FOOD OR MEDICATIONS FOR 30  MINUTES What changed: See the new instructions.   losartan 100 MG tablet Commonly known as: COZAAR Take 1 tablet (100 mg total) by mouth daily.   metoprolol tartrate 25 MG tablet Commonly known as: LOPRESSOR Take 0.5 tablets (12.5 mg total) by mouth 2 (two) times daily.   nitroGLYCERIN 0.3 MG SL tablet Commonly known as: NITROSTAT Place 1 tablet (0.3 mg total) under the tongue every 5 (five) minutes as needed.   potassium chloride 10 MEQ tablet Commonly known as:  KLOR-CON Take 1 tablet (10 mEq total) by mouth daily.   Systane 0.4-0.3 % Soln Generic drug: Polyethyl Glycol-Propyl Glycol Place 1 drop into both eyes 2 (two) times daily as needed (Dry eye).   VITAMIN D PO Take 1 tablet by mouth daily.   ZINC PO Take 1 tablet by mouth daily.          Outstanding Labs/Studies   TVAR work up as schedule   Duration of Discharge Encounter   Greater than 30 minutes including physician time.  Jarrett Soho, PA 06/07/2019, 9:38 AM

## 2019-06-07 NOTE — Discharge Instructions (Signed)
Cardiac Rehabilitation What is cardiac rehabilitation? Cardiac rehabilitation is a treatment program that helps improve the health and well-being of people who have heart problems. Cardiac rehabilitation includes exercise training, education, and counseling to help you get stronger and return to an active lifestyle. This program can help you get better faster and reduce any future hospital stays. Why might I need cardiac rehabilitation? Cardiac rehabilitation programs can help when you have or have had:  A heart attack.  Heart failure.  Peripheral artery disease.  Coronary artery disease.  Angina.  Lung or breathing problems. Cardiac rehabilitation programs are also used when you have had:  Coronary artery bypass graft surgery.  Heart valve replacement.  Heart stent placement.  Heart transplant.  Aneurysm repair. What are the benefits of cardiac rehabilitation? Cardiac rehabilitation can help you:  Reduce problems like chest pain and trouble breathing.  Change risk factors that contribute to heart disease, such as: ? Smoking. ? High blood pressure. ? High cholesterol. ? Diabetes. ? Being inactive. ? Weighing over 30% more than your ideal weight. ? Diet.  Improve your emotional outlook so you feel: ? More hopeful. ? Better about yourself. ? More confident about taking care of yourself.  Get support from health experts as well as other people with similar problems.  Learn healthy ways to manage stress.  Learn how to manage and understand your medicines.  Teach your family about your condition and how to participate in your recovery. What happens in cardiac rehabilitation? You will be assessed by a cardiac rehabilitation team. They will check your health history and do a physical exam. You may need blood tests, exercise stress tests, and other evaluations to make sure that you are ready to start cardiac rehabilitation. The cardiac rehabilitation team works with  you to make a plan based on your health and goals. Your program will be tailored to fit you and your needs and may change as you progress. You may work with a health care team that includes:  Doctors.  Nurses.  Dietitians.  Psychologists.  Exercise specialists.  Physical and occupational therapists. What are the phases of cardiac rehabilitation? A cardiac rehabilitation program is often divided into phases. You advance from one phase to the next. Phase 1 This phase starts while you are still in the hospital. You may:  Start by walking in your room and then in the hall.  Do some simple exercises with a therapist.  Phase 2 This phase begins when you go home or to another facility. You will travel to a cardiac rehabilitation center or another place where rehabilitation is offered. This phase may last 8-12 weeks. During this phase:  You will slowly increase your activity level while being closely watched by a nurse or therapist.  You will have medical tests and exams to monitor your progress.  Your exercises may include strength or resistance training along with activities that cause your heart to beat faster (aerobic exercises), such as walking on a treadmill.  Your condition will determine how often and how long these sessions last.  You may learn how to: ? Victoria Burns heart-healthy meals. ? Control your blood sugar, if this applies. ? Stop smoking. ? Manage your medicines. You may need help with scheduling or planning how and when to take your medicines. If you have questions about your medicines, it is very important that you talk with your health care provider.  Phase 3 This phase continues for the rest of your life. In this phase:  There  will be less supervision.  You may continue to participate in cardiac rehabilitation activities or become part of a group in your community.  You may benefit from talking about your experience with other people who are facing similar  challenges. Follow these instructions at home:  Take over-the-counter and prescription medicines only as told by your health care provider.  Keep all follow-up visits as told by your health care provider. This is important. Get help right away if:  You have severe chest discomfort, especially if the pain is crushing or pressure-like and spreads to your arms, back, neck, or jaw. Do not wait to see if the pain will go away.  You have weakness or numbness in your face, arms, or legs, especially on one side of the body.  Your speech is slurred.  You are confused.  You have a sudden, severe headache or loss of vision.  You have shortness of breath.  You are sweating and have nausea.  You feel dizzy or faint.  You are fatigued. These symptoms may represent a serious problem that is an emergency. Do not wait to see if the symptoms will go away. Get medical help right away. Call your local emergency services (911 in the [place] Do not drive yourself to the hospital. Summary  Cardiac rehabilitation is a treatment program that helps improve the health and well-being of people who have heart problems.  A cardiac rehabilitation program is often divided into phases. You advance from one phase to the next.  The cardiac rehabilitation team works with you to make a plan based on your health and goals.  Cardiac rehabilitation includes exercise training, education, and counseling to help you get stronger and return to an active lifestyle. This information is not intended to replace advice given to you by your health care provider. Make sure you discuss any questions you have with your health care provider. Document Released: 04/11/2008 Document Revised: 10/23/2018 Document Reviewed: 05/02/2018 Elsevier Patient Education  2020 [employer].

## 2019-06-07 NOTE — Progress Notes (Signed)
    Subjective:  Denies SSCP, palpitations or Dyspnea Hopes to have TAVR 06/24/19  Objective:  Vitals:   06/06/19 1507 06/06/19 2033 06/06/19 2134 06/07/19 0527  BP: (!) 155/60 (!) 127/53 (!) 140/52 (!) 115/56  Pulse: 63 70 72 63  Resp:      Temp:  98.5 F (36.9 C)  98 F (36.7 C)  TempSrc:  Oral  Oral  SpO2:  98%  97%  Weight:    86.6 kg  Height:        Intake/Output from previous day: No intake or output data in the 24 hours ending 06/07/19 0809  Physical Exam:  Overweight white female Both radials post cath left more bruising good pulse Loud AS murmur  Lungs clear No edema Abdomen soft   Lab Results: Basic Metabolic Panel: Recent Labs    06/06/19 0310  NA 141  K 3.9  CL 108  CO2 24  GLUCOSE 96  BUN 11  CREATININE 0.86  CALCIUM 8.6*   CBC: Recent Labs    06/06/19 0310  WBC 7.5  HGB 11.8*  HCT 35.7*  MCV 99.4  PLT 217     Imaging: No results found.  Cardiac Studies:  ECG:    Telemetry:  NSR 06/07/2019   Echo: Normal EF severe AS   Medications:   . aspirin  81 mg Oral Daily  . atorvastatin  80 mg Oral q1800  . cloNIDine  0.1 mg Oral BID  . clopidogrel  75 mg Oral Daily  . furosemide  20 mg Oral Daily  . isosorbide mononitrate  30 mg Oral BID  . levothyroxine  25 mcg Oral QAC breakfast  . losartan  100 mg Oral Daily  . metoprolol tartrate  12.5 mg Oral BID  . sodium chloride flush  3 mL Intravenous Q12H     . sodium chloride      Assessment/Plan:   1.  CAD:  Post atherectomy of OM continue DAT with ASA and plavix 2. 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

## 2019-06-09 ENCOUNTER — Encounter (HOSPITAL_COMMUNITY): Payer: Self-pay | Admitting: Cardiology

## 2019-06-09 ENCOUNTER — Institutional Professional Consult (permissible substitution): Payer: Medicare Other | Admitting: Thoracic Surgery (Cardiothoracic Vascular Surgery)

## 2019-06-09 ENCOUNTER — Ambulatory Visit (HOSPITAL_COMMUNITY): Payer: Medicare Other

## 2019-06-09 ENCOUNTER — Ambulatory Visit (HOSPITAL_COMMUNITY)
Admission: RE | Admit: 2019-06-09 | Discharge: 2019-06-09 | Disposition: A | Discharge status: Home / Self Care | Payer: Medicare Other | Source: Ambulatory Visit | Attending: Cardiovascular Disease | Admitting: Cardiovascular Disease

## 2019-06-09 ENCOUNTER — Other Ambulatory Visit: Payer: Self-pay

## 2019-06-09 VITALS — BP 153/80 | HR 83 | Temp 97.7°F | Resp 20 | Ht 61.0 in | Wt 193.0 lb

## 2019-06-09 DIAGNOSIS — I25118 Atherosclerotic heart disease of native coronary artery with other forms of angina pectoris: Secondary | ICD-10-CM

## 2019-06-09 DIAGNOSIS — I350 Nonrheumatic aortic (valve) stenosis: Secondary | ICD-10-CM

## 2019-06-09 DIAGNOSIS — I700 Atherosclerosis of aorta: Secondary | ICD-10-CM | POA: Diagnosis not present

## 2019-06-09 DIAGNOSIS — Z01818 Encounter for other preprocedural examination: Secondary | ICD-10-CM | POA: Diagnosis not present

## 2019-06-09 MED ORDER — IOHEXOL 350 MG/ML SOLN
100.0000 mL | Freq: Once | INTRAVENOUS | Status: AC | PRN
  Administered 2019-06-09: 100 mL via INTRAVENOUS

## 2019-06-09 NOTE — Progress Notes (Signed)
Carotid artery duplex has been completed. Preliminary results can be found in CV Proc through chart review.   06/09/19 9:01 AM Carlos Levering RVT

## 2019-06-09 NOTE — Progress Notes (Signed)
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  Patient has history of rheumatic fever during childhood and states that she has been told that she had a heart murmur for the majority of her entire adult life.  [place] the [place]  Approximately 2 years ago she relocated to [place] and she has been followed by Dr. Rockey Situ for the past year and a half.  Previous echocardiograms have documented the presence of normal left ventricular systolic function with at least moderate to severe aortic stenosis.  She describes a long history of symptoms of exertional chest pain and shortness of breath dating back at least 2 or 3 years.  Symptoms have reportedly gotten much worse over the past 6 months.  She now gets short of breath with very low level activity and she gets substernal chest discomfort on a frequent basis.  Chest pain is usually short-lived and promptly relieved with sublingual nitroglycerin.  She denies episodes of resting shortness of breath or orthopnea.  She does have  lower extremity edema.  She has not had dizzy spells or syncope.    Patient recently underwent follow-up echocardiogram May 06, 2019 which by report revealed normal left ventricular systolic function with ejection fraction estimated 55 to 60%, moderate to severe aortic stenosis with moderate aortic insufficiency.  The patient was referred to the multidisciplinary heart valve clinic and has been evaluated previously by Dr. Angelena Form.  Catheterization revealed multivessel coronary artery disease with severe disease of the obtuse marginal branch involving proximal edge of previously placed stent.  Direct assessment of severity of aortic stenosis was not feasible.  There was moderate pulmonary hypertension.  The patient was also noted to have severe peripheral arterial disease with 80% ostial stenosis of the right common iliac artery and occluded right superficial femoral artery.  The patient subsequently underwent multivessel PCI and stenting including orbital atherectomy of the ostial left circumflex with stenting of the ostial left circumflex, the proximal first obtuse marginal, and the distal obtuse marginal branch using a total of 3 drug-eluting stents.  The patient has continued to experience episodes of substernal chest pain since her stent procedure last week, including 1 episode of chest pain yesterday evening which was promptly resolved with sublingual nitroglycerin.  Patient is married and lives locally in [place] with her husband.  She is accompanied by her husband and one of her daughters for her consultation visit today.  She lives a sedentary lifestyle but remains functionally independent.  She is limited primarily by exertional shortness of breath and chest discomfort.  She also has some arthritis with some pain in both ankles.  However she walks without need for mechanical support or assistance.  She does not exercise on a regular basis.  Past Medical History:  Diagnosis Date   Arthritis     CAD (coronary artery disease)    a. s/p prior stenting; b. 05/2017 Cath ([facility] [place]): LAD mild irregs, LCX 39m OM2 40 ISR, RCA dominant, RPLV 50; c. 02/2018 MV: EF 64%. No ischemia/scar.   Carotid arterial disease ([facility]    a. 02/2018 Carotid U/S: 1-39% bilat ICA stenosis. F/u prn.   Chronic back pain    Hyperlipidemia    Hypertension    Hypothyroidism    Juxtafoveal telangietasis of both eyes    Moderate to severe aortic stenosis    a. 04/2016 Echo: Mod AS; b. 02/2018 Echo: EF 55-60%, no rwma, Gr1 DD. Mod to sev AS. Mod AI. Mean grad 344mg. Valve area (VTI): 0.71 cm^2.   RBBB    TIA (transient ischemic attack)     Past Surgical History:  Procedure Laterality Date   APPENDECTOMY     BACK SURGERY  08/2016   BREAST SURGERY     Biopsy   CAROTID ENDARTERECTOMY Bilateral    CHOLECYSTECTOMY     CORONARY ANGIOGRAPHY N/A 06/05/2019   Procedure: CORONARY ANGIOGRAPHY (CATH LAB);  Surgeon: McBurnell BlanksMD;  [facility];  Service: Cardiovascular;  Laterality: N/A;   CORONARY ATHERECTOMY N/A 06/06/2019   Procedure: CORONARY ATHERECTOMY;  Surgeon: JoMartiniquePeter M, MD;  [facility];  Service: Cardiovascular;  Laterality: N/A;   RIGHT/LEFT HEART CATH AND CORONARY ANGIOGRAPHY Bilateral 05/23/2019   Procedure: RIGHT/LEFT HEART CATH AND CORONARY ANGIOGRAPHY;  Surgeon: GoMinna MerrittsMD;  [facility];  Service: Cardiovascular;  Laterality: Bilateral;   TONSILLECTOMY AND ADENOIDECTOMY      Family History  Problem Relation Age of Onset   Heart disease Mother    Hyperlipidemia Mother    Hypertension Mother    Alcohol abuse Father    COPD Father    Heart attack Father    Arthritis Sister    Hypertension Sister    Cancer Sister    COPD Sister    Heart attack Sister    Heart disease Sister    Hypertension Sister     Social History   Socioeconomic History   Marital status: Married      Spouse name: Not on file   Number of children: 3   Years of education: Not on file   Highest education level: Not on file  Occupational History   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   Social NeDesigner, fashion/clothingtrain: Not on file   Food insecurity    Worry: Not on file    Inability: Not on file   Transportation needs    Medical: Not on file    Non-medical: Not on file  Tobacco Use   Smoking status: Former Smoker    Types: Cigarettes   Smokeless tobacco: Never Used  Substance and Sexual Activity   Alcohol use: No    Frequency: Never   Drug use: No   Sexual activity: Yes  Lifestyle   Physical activity    Days per week: Not on file    Minutes per session: Not on file   Stress: Not on file  Relationships   Social connections    Talks on phone: Not on file    Gets together: Not on file    Attends religious service: Not on file    Active member of club or organization: Not  on file    Attends meetings of clubs or organizations: Not on file    Relationship status: Not on file   Intimate partner violence    Fear of current or ex partner: Not on file    Emotionally abused: Not on file    Physically abused: Not on file    Forced sexual activity: Not on file  Other Topics Concern   Not on file  Social History Narrative   Not on file    Current Outpatient Medications  Medication Sig Dispense Refill   aspirin 81 MG chewable tablet Chew 81 mg by mouth daily.     atorvastatin (LIPITOR) 80 MG tablet Take 1 tablet (80 mg total) by mouth daily at 6 PM. 90 tablet 2   cloNIDine (CATAPRES) 0.1 MG tablet Take 1 tablet (0.1 mg total) by mouth 2 (two) times daily. 60 tablet 0   clopidogrel (PLAVIX) 75 MG tablet Take 1 tablet (75 mg total) by mouth daily. 90 tablet 3   furosemide (LASIX) 20 MG tablet Take 1 tablet (20 mg total) by mouth daily. 30 tablet 2   isosorbide mononitrate (IMDUR) 30 MG 24 hr tablet Take 1 tablet (30 mg total) by mouth 2  (two) times daily. 180 tablet 2   levothyroxine (SYNTHROID) 25 MCG tablet TAKE 1 TABLET BY MOUTH  EVERY MORNING ON AN EMPTY  STOMACH WITH WATER - NO  FOOD OR MEDICATIONS FOR 30  MINUTES (Patient taking differently: Take 25 mcg by mouth daily before breakfast. TAKE 1 TABLET BY MOUTH  EVERY MORNING ON AN EMPTY  STOMACH WITH WATER - NO  FOOD OR MEDICATIONS FOR 30  MINUTES) 90 tablet 0   losartan (COZAAR) 100 MG tablet Take 1 tablet (100 mg total) by mouth daily. 90 tablet 2   metoprolol tartrate (LOPRESSOR) 25 MG tablet Take 0.5 tablets (12.5 mg total) by mouth 2 (two) times daily. 90 tablet 3   Misc Natural Products (FOCUSED MIND PO) Take 1 tablet by mouth daily.      Multiple Vitamins-Minerals (ZINC PO) Take 1 tablet by mouth daily.      nitroGLYCERIN (NITROSTAT) 0.3 MG SL tablet Place 1 tablet (0.3 mg total) under the tongue every 5 (five) minutes as needed. 25 tablet 1   Polyethyl Glycol-Propyl Glycol (SYSTANE) 0.4-0.3 % SOLN Place 1 drop into both eyes 2 (two) times daily as needed (Dry eye).     potassium chloride (KLOR-CON) 10 MEQ tablet Take 1 tablet (10 mEq total) by mouth daily. 30 tablet 2   VITAMIN D PO Take 1 tablet by mouth daily.      No current facility-administered medications for this visit.     Allergies  Allergen Reactions   Codeine Rash   Penicillins Rash    Did it involve swelling of the face/tongue/throat, SOB, or low BP? No Did it involve sudden or severe rash/hives, skin peeling, or any reaction on the inside of your mouth or nose? No Did you need to seek medical attention at a hospital or doctor's office? No When did it last happen?over 50 years ago If all above answers are NO, may proceed with cephalosporin use.      Review of Systems:   General:  normal appetite, decreased energy, no weight gain, no weight loss, no fever  Cardiac:  + chest pain with exertion, + chest pain at rest, + SOB with minimal exertion, no resting SOB, no PND, no  orthopnea, no palpitations, no arrhythmia, no atrial fibrillation, + LE edema,  no dizzy spells, no syncope  Respiratory:  + shortness of breath, no home oxygen, no productive cough, no dry cough, no bronchitis, no wheezing, no hemoptysis, no asthma, no pain with inspiration or cough, no sleep apnea, no CPAP at night  GI:   no difficulty swallowing, + reflux, no frequent heartburn, no hiatal hernia, no abdominal pain, + constipation, no diarrhea, no hematochezia, no hematemesis, no melena  GU:   no dysuria,  no frequency, no urinary tract infection, no hematuria, no kidney stones, no kidney disease  Vascular:  no pain suggestive of claudication, + pain in feet, no leg cramps, no varicose veins, no DVT, no non-healing foot ulcer  Neuro:   no stroke, + TIA's, no seizures, no headaches, no temporary blindness one eye,  no slurred speech, no peripheral neuropathy, no chronic pain, mild instability of gait, no memory/cognitive dysfunction  Musculoskeletal: + arthritis, no joint swelling, no myalgias, no difficulty walking, normal mobility   Skin:   no rash, no itching, no skin infections, no pressure sores or ulcerations  Psych:   no anxiety, no depression, no nervousness, no unusual recent stress  Eyes:   no blurry vision, + floaters, no recent vision changes, + wears glasses or contacts  ENT:   no hearing loss, no loose or painful teeth, no dentures, last saw dentist September 2020  Hematologic:  + easy bruising, no abnormal bleeding, no clotting disorder, no frequent epistaxis  Endocrine:  no diabetes, does not check CBG's at home           Physical Exam:   BP (!) 153/80 (BP Location: Right Arm)    Pulse 83    Temp 97.7 F (36.5 C) (Skin)    Resp 20    Ht 5' 1" (1.549 m)    Wt 193 lb (87.5 kg)    SpO2 94% Comment: RA   BMI 36.47 kg/m   General:  Moderately obese,  well-appearing  HEENT:  Unremarkable   Neck:   no JVD, no bruits, no adenopathy   Chest:   clear to auscultation, symmetrical  breath sounds, no wheezes, no rhonchi   CV:   RRR, grade III/VI crescendo/decrescendo murmur heard best at RSB,  no diastolic murmur  Abdomen:  soft, non-tender, no masses   Extremities:  warm, well-perfused, pulses not palpable, + bilateral mild LE edema  Rectal/GU  Deferred  Neuro:   Grossly non-focal and symmetrical throughout  Skin:   Clean and dry, no rashes, no breakdown   Diagnostic Tests:    ECHOCARDIOGRAM REPORT       Patient Name:   Victoria Burns Date of Exam: 05/06/2019 Medical Rec #:  [id number]         Height:       62.0 in Accession #:    [id number]        Weight:       192.0 lb Date of Birth:  Dec 07, 1940          BSA:          1.88 m Patient Age:    69 years          BP:           128/62 mmHg Patient Gender: F                 HR:           68 bpm. Exam Location:  [place]  Procedure: 2D Echo, Cardiac Doppler and Color Doppler  Indications:  I35.0 Nonrheumatic aortic (valve) stenosis   History:        Patient has prior history of Echocardiogram examinations, most                 recent 02/28/2018. CAD, PAD; Aortic Valve Disease and Mitral                 Valve Disease Signs/Symptoms:Shortness of Breath Risk                 Factors:Hypertension, Dyslipidemia and Former Smoker. Patient                 became extremely winded with very little effort.   Sonographer:    Pilar Jarvis RDMS, RVT, RDCS Referring Phys: [place]    1. Left ventricular ejection fraction, by visual estimation, is 55 to 60%. The left ventricle has normal function. Normal left ventricular size. There is mildly increased left ventricular hypertrophy.  2. Left ventricular diastolic Doppler parameters are consistent with impaired relaxation pattern of LV diastolic filling.  3. Global right ventricle has normal systolic function.The right ventricular size is normal. No increase in right ventricular wall thickness.  4. Left atrial size was mildly dilated.  5.  Right atrial size was normal.  6. The mitral valve is normal in structure. Moderate mitral valve regurgitation.  7. The tricuspid valve is grossly normal. Tricuspid valve regurgitation is trivial.  8. The aortic valve is tricuspid Aortic valve regurgitation is moderate by color flow Doppler. Moderate to severe aortic valve stenosis.  9. Peak aortic valve velocity has increased since 02/2018; peak velocity and valve area are both now in the severe stenosis range. Mean gradient and dimensionless index remain in the moderate range. 10. The pulmonic valve was not well visualized. Pulmonic valve regurgitation is not visualized by color flow Doppler. 11. Normal pulmonary artery systolic pressure. 12. The inferior vena cava is normal in size with greater than 50% respiratory variability, suggesting right atrial pressure of 3 mmHg. 13. The interatrial septum was not well visualized.  FINDINGS  Left Ventricle: Left ventricular ejection fraction, by visual estimation, is 55 to 60%. The left ventricle has normal function. No evidence of left ventricular regional wall motion abnormalities. There is mildly increased left ventricular hypertrophy.  Normal left ventricular size. Spectral Doppler shows Left ventricular diastolic Doppler parameters are consistent with impaired relaxation pattern of LV diastolic filling.  Right Ventricle: The right ventricular size is normal. No increase in right ventricular wall thickness. Global RV systolic function is has normal systolic function. The tricuspid regurgitant velocity is 2.77 m/s, and with an assumed right atrial pressure  of 3 mmHg, the estimated right ventricular systolic pressure is normal at 33.7 mmHg.  Left Atrium: Left atrial size was mildly dilated.  Right Atrium: Right atrial size was normal in size  Pericardium: There is no evidence of pericardial effusion.  Mitral Valve: The mitral valve is normal in structure. Moderate mitral valve  regurgitation.  Tricuspid Valve: The tricuspid valve is grossly normal. Tricuspid valve regurgitation is trivial by color flow Doppler.  Aortic Valve: The aortic valve is tricuspid. Aortic valve regurgitation is moderate by color flow Doppler. Aortic regurgitation PHT measures 356 msec. Moderate to severe aortic stenosis is present. Aortic valve mean gradient measures 32.0 mmHg. Aortic  valve peak gradient measures 69.2 mmHg. Aortic valve area, by VTI measures 0.83 cm. Peak aortic valve velocity has increased since 02/2018; peak velocity and valve area are both now  in the severe stenosis range. Mean gradient and dimensionless index  remain in the moderate range.  Pulmonic Valve: The pulmonic valve was not well visualized. Pulmonic valve regurgitation is not visualized by color flow Doppler. No evidence of pulmonic stenosis.  Aorta: The aortic root is normal in size and structure.  Pulmonary Artery: The pulmonary artery is not well seen.  Venous: The inferior vena cava is normal in size with greater than 50% respiratory variability, suggesting right atrial pressure of 3 mmHg.  IAS/Shunts: The interatrial septum was not well visualized.     LEFT VENTRICLE PLAX 2D LVIDd:         5.00 cm       Diastology LVIDs:         3.50 cm       LV e' lateral:   5.98 cm/s LV PW:         1.10 cm       LV E/e' lateral: 15.6 LV IVS:        1.00 cm       LV e' medial:    6.31 cm/s LVOT diam:     1.80 cm       LV E/e' medial:  14.8 LV SV:         67 ml LV SV Index:   33.82 LVOT Area:     2.54 cm   LV Volumes (MOD) LV area d, A2C:    23.20 cm LV area d, A4C:    29.30 cm LV area s, A4C:    17.00 cm LV major d, A2C:   7.22 cm LV major d, A4C:   7.28 cm LV major s, A4C:   6.39 cm LV vol d, MOD A2C: 63.4 ml LV vol d, MOD A4C: 97.7 ml LV vol s, MOD A4C: 39.2 ml LV SV MOD A4C:     97.7 ml  RIGHT VENTRICLE             IVC RV Basal diam:  2.60 cm     IVC diam: 2.20 cm RV S prime:      11.10 cm/s TAPSE (M-mode): 2.5 cm  LEFT ATRIUM             Index       RIGHT ATRIUM           Index LA diam:        4.20 cm 2.24 cm/m  RA Area:     13.80 cm LA Vol (A2C):   43.1 ml 22.94 ml/m RA Volume:   32.40 ml  17.24 ml/m LA Vol (A4C):   67.3 ml 35.82 ml/m LA Biplane Vol: 55.4 ml 29.49 ml/m  AORTIC VALVE                    PULMONIC VALVE AV Area (Vmax):    0.80 cm     PV Vmax:       0.85 m/s AV Area (Vmean):   0.85 cm     PV Peak grad:  2.9 mmHg AV Area (VTI):     0.83 cm AV Vmax:           416.00 cm/s AV Vmean:          259.000 cm/s AV VTI:            1.030 m AV Peak Grad:      69.2 mmHg AV Mean Grad:      32.0 mmHg LVOT Vmax:  131.00 cm/s LVOT Vmean:        86.900 cm/s LVOT VTI:          0.335 m LVOT/AV VTI ratio: 0.33 AI PHT:            356 msec   AORTA Ao Root diam: 2.80 cm  MITRAL VALVE                         TRICUSPID VALVE MV Area (PHT): 4.15 cm              TR Peak grad:   30.7 mmHg MV PHT:        53.07 msec            TR Vmax:        277.00 cm/s MV Decel Time: 183 msec MV E velocity: 93.10 cm/s  103 cm/s  SHUNTS MV A velocity: 104.00 cm/s 70.3 cm/s Systemic VTI:  0.34 m MV E/A ratio:  0.90        1.5       Systemic Diam: 1.80 cm    Nelva Bush MD Electronically signed by Nelva Bush MD Signature Date/Time: 05/06/2019/7:27:18 PM      RIGHT/LEFT HEART CATH AND CORONARY ANGIOGRAPHY  Conclusion   2nd Mrg-1 lesion is 70% stenosed.  2nd Mrg-2 lesion is 30% stenosed.  2nd Mrg-3 lesion is 60% stenosed.  Hemodynamic findings consistent with moderate pulmonary hypertension.    Surgeon Notes    05/23/2019 5:07 PM Op Note signed by Katha Cabal, MD  Indications  Shortness of breath [phone] (ICD-10-CM)]  Procedural Details  Technical Details Cardiac Catheterization Procedure Note  Name: Victoria Burns MRN: [id number] DOB: 1941/03/25  Procedure: Left Heart Cath, Selective Coronary Angiography, LV angiography, right  heart cath  Indication:  Ms. Betke is a 78 year old woman with past medical history of Hypothyroidism Hyperlipidemia Coronary artery disease, history of stenting x1 to OM Hypertension Chronic back pain PAD, bilateral carotid endarterectomy, 15 yr ago 1 year ago Former smoker Moderate to severe AS in 02/2018 Spinal surgery Prior sleep study AHI 13 events per hour and the RDI was 17 events per hour performed May 2016 Presenting for cardiac catheterization given history of coronary artery disease, prior stent to OM vessel , unstable angina symptoms aortic valve stenosis/preop eval  Procedural details: The right groin was prepped, draped, and anesthetized with 1% lidocaine. Using modified Seldinger technique, a 5 French sheath was introduced into the right femoral artery.  7 French sheath introduced into right femoral vein for right heart pressures.  standard Judkins catheters (JL 4, JR 4 and pigtail catheter) were used for coronary angiography and left ventriculography. Catheter exchanges were performed over a guidewire. There were no immediate procedural complications. The patient was transferred to the post catheterization recovery area for further monitoring.  Moderate sedation: 1. Sedation used:2 mg versed, 50 ug fentanyl IV 2. Time of administration:       3:30 PM     Time patient left for recovery  5:15 pm, sedation time 1:45 hr 3. I was Face to Face with the patient during this time: (code: [phone])   Procedural Findings: Very challenging case given severe access issues, assisted by Dr. Barbera Setters Determined that SFA 100% ostial chronic occlusion on the right 80% ostial right common iliac artery Able to gain access through the right femoral artery above the occlusion, used a KMP 65 catheter used to assist with crossing the common iliac lesion  ---  Very challenging visualization of the RCA, multiple catheters tried 3 [place] able to engage the ostium     Coronary angiography:   Coronary dominance: Right or codominant  Left mainstem:   Large vessel that bifurcates into the LAD and left circumflex, no significant disease noted  Left anterior descending (LAD): Moderate sized vessel that appears to terminate prior to the apex, diagonal branch 2 of small size, mild to moderate diffuse disease  Left circumflex (LCx):  Large vessel with OM branch 2, severe OM disease proximal region estimated 70 to 80%, location appears at proximal stent edge, moderate mid OM disease estimated 60%  Right coronary artery (RCA):  Right dominant vessel with PL and PDA, moderate diffuse disease  Left ventriculography: LV gram not performed given contrast load Very difficult time crossing aortic valve Valve was crossed with tremendous difficulty, unable to torque the catheter well secondary to severe common iliac disease Catheter damped, and inability to manipulate catheter off the wall.  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 Occluded right SFA   Recommendations:  Will refer to TAVR clinic given severe aortic valve stenosis We will have them review cardiac catheterization films, disease and OM vessel Will need CT scan chest abdomen pelvis given severe PAD appears to be worse on the right common iliac and SFA.  Left not well visualized but appears to be patent  -During the case had chest pain, following the case had chest pain Long discussion with her concerning her pain which appeared to be reproducible on palpation, possible muscle cramping Reports this pain has been going on for months comes and goes -She had markedly  elevated pressures 180 up to 200 during the case Also noted to have high blood pressure during recent office visit She was started on clonidine 0.1 mg twice daily Reports that she took 2 doses and blood pressure was markedly low in the 90s, minimal symptoms -She did receive hydralazine IV 10x2 doses with drop in her pressures into the 29J systolic range Chest pain relieved with tramadol, heat pack, morphine  --For now we will recommend she take half a clonidine the morning half a clonidine in the p.m. Continue other outpatient medications and closely monitor blood pressure Take additional half clonidine for systolic pressure greater than 160   Timothy Gollan 05/23/2019, 6:23 PM  Estimated blood loss <50 mL.   During this procedure medications were administered to achieve and maintain moderate conscious sedation while the patient's heart rate, blood pressure, and oxygen saturation were continuously monitored and I was present face-to-face 100% of this time.  Medications (Filter: Administrations occurring from 05/23/19 1530 to 05/24/19 1530) (important)  Continuous medications are totaled by the amount administered until 05/24/19 1530.   Radiation/Fluoro  Fluoro time: 0, 41.2 (min) DAP: 0, 91.2 (Gycm2) Cumulative Air Kerma: 0, 1062 (mGy)  Coronary Findings  Diagnostic Dominance: Right Left Anterior Descending  There is moderate diffuse disease throughout the vessel.  Left Circumflex  Second Obtuse Marginal Branch  2nd Mrg-1 lesion 70% stenosed  2nd Mrg-1 lesion is 70% stenosed. The lesion was previously treated.  2nd Mrg-2 lesion 30% stenosed  2nd Mrg-2 lesion is 30% stenosed. The lesion was previously treated.  2nd Mrg-3 lesion 60% stenosed  2nd Mrg-3 lesion is 60% stenosed.  Right Coronary Artery  There is moderate diffuse disease throughout the vessel.  Intervention  No interventions have been documented. Right Heart  Right Heart Pressures Hemodynamic findings  consistent with moderate pulmonary hypertension. Elevated LV EDP consistent with volume overload.  Coronary Diagrams  Diagnostic Dominance: Right     CORONARY ATHERECTOMY  Conclusion    1st Mrg-2 lesion is 60% stenosed.  A drug-eluting stent was successfully placed using a STENT RESOLUTE ONYX 2.5X18.  Post intervention, there is a 0% residual stenosis.  1st Mrg-1 lesion is 80% stenosed.  A drug-eluting stent was successfully placed using a [place] [id number].  Post intervention, there is a 0% residual stenosis.  Ost Cx to Prox Cx lesion is 75% stenosed.  A drug-eluting stent was successfully placed using a STENT RESOLUTE ONYX 3.5X12.  Post intervention, there is a 0% residual stenosis.   1. Successful orbital atherectomy and stenting of the ostial LCx, proximal OM1 and distal OM1 with DES x3  Plan: DAPT for one year. Anticipate DC in am.    Recommendations  Antiplatelet/Anticoag Recommend uninterrupted dual antiplatelet therapy with Aspirin 66m daily and Clopidogrel 753mdaily for a minimum of 12 months (ACS-Class I recommendation).  Surgeon Notes    05/23/2019 5:07 PM Op Note signed by ScKatha CabalMD  Indications  Stable angina ([facility][I20.8 (ICD-10-CM)]  Procedural Details  Technical Details Indication: [id number] WF with Aortic stenosis and CAD. Unsuccessful PCI Of the LCx due to inability to cross lesion with the wire. Prior cardiac cath was unsuccessful from a right radial approach due to severe tortuosity and stenosis of the innominate artery. Right groin access very difficult due to severe iliac stenosis. PCI yesterday done via the left groin approach and she has extensive bruising at this site. For this reason left radial approach used.   Procedural Details: The left wrist was prepped, draped, and anesthetized with 1% lidocaine. Using the modified Seldinger technique, a 6 Fr slender sheath was introduced into the radial artery. 3 mg verapamil  was administered through the radial sheath. Weight-based heparin was given for anticoagulation. Once a therapeutic ACT was achieved, a 6 Fr[place]BU 3.5  guide catheter was inserted.  A prowater  coronary guidewire was used to cross the lesions in the LCx and OM.  Using a teleport catheter the prowater was exchanged for a Viper wire. We then performed orbital atherectomy of the ostial and mid LCx using a Diamondback catheter at 80K rpm for 5 passes in the mid vessel and 3 passes at the ostium. The lesions were predilated with a 2.5 mm balloon. We then exchanged the Viper wire for a prowater.  The lesion distally was then stented with a 2.5 x 18 mm Resolute Onyx stent for in stent restenosis.  The mid vessel was stented with a 2.75x12 mm Resolute Onyx stent. These were post dilated with a 3.0 mm [place] balloon. The ostial lesion was then stented with a 3.5 x 12 mm Resolute Onyx stent. The stent was postdilated with a 3.75 mm  noncompliant balloon.  Following PCI, there was 0% residual stenosis and TIMI-3 flow. Final angiography confirmed an excellent result. The patient tolerated the procedure well. There were no immediate procedural complications. A TR band was used for radial hemostasis. The patient was transferred to the post catheterization recovery area for further monitoring.  Contrast: 125 cc    Estimated blood loss <50 mL.   During this procedure medications were administered to achieve and maintain moderate conscious sedation while the patient's heart rate, blood pressure, and oxygen saturation were continuously monitored and I was present face-to-face 100% of this time.   Contrast  Medication Name Total Dose  iohexol (OMNIPAQUE) 350 MG/ML injection 125 mL    Radiation/Fluoro  Fluoro time: 25.3 (min) DAP: 42.4 (Gycm2) Cumulative Air Kerma: 793.9 (mGy)  Complications  Complications documented before study signed (06/06/2019 0:30 PM)   No complications were associated with this study.   Documented by [place], Peter M, MD - 06/06/2019 2:43 PM    Coronary Findings  Diagnostic Dominance: Right Left Circumflex  Vessel is large.  Ost Cx to Prox Cx lesion 75% stenosed  Ost Cx to Prox Cx lesion is 75% stenosed.  First Obtuse Marginal Branch  Vessel is large in size.  1st Mrg-1 lesion 80% stenosed  1st Mrg-1 lesion is 80% stenosed. The lesion is calcified.  1st Mrg-2 lesion 60% stenosed  1st Mrg-2 lesion is 60% stenosed. The lesion was previously treated using a stent (unknown type) at an unknown date.  Second Obtuse Marginal Branch  Vessel is small in size.  Intervention  Ost Cx to Prox Cx lesion  Stent  CATHETER LAUNCHER 6FR EBU3.5 guide catheter was inserted. Lesion crossed with guidewire using a WIRE ASAHI PROWATER 180CM. Pre-stent angioplasty was performed using a BALLOON SAPPHIRE 2.5X12. A drug-eluting stent was successfully placed using a STENT RESOLUTE ONYX 3.5X12. Stent strut is well apposed. Post-stent angioplasty was performed using a BALLOON SAPPHIRE [place] 3.75X8. Maximum pressure: 16 atm.  Atherectomy  CATHETER LAUNCHER 6FR EBU3.5 guide catheter was inserted. WIRE VIPERWIRE COR FLEX TIP guidewire was used to cross lesion. Orbital atherectomy was performed using a CROWN DIAMONDBACK CLASSIC 1.25. 3 passes taken.  Post-Intervention Lesion Assessment  The intervention was successful. Pre-interventional TIMI flow is 3. Post-intervention TIMI flow is 3. No complications occurred at this lesion.  There is a 0% residual stenosis post intervention.  1st Mrg-1 lesion  Stent  CATHETER LAUNCHER 6FR EBU3.5 guide catheter was inserted. Lesion crossed with guidewire using a WIRE ASAHI PROWATER 180CM. Pre-stent angioplasty was performed using a BALLOON SAPPHIRE 2.5X12. A drug-eluting stent was successfully placed using a [place] [id number]. Stent strut is well apposed. Post-stent angioplasty was performed using a BALLOON SAPPHIRE [place] 3.0X8. Maximum pressure: 18 atm.   Atherectomy  CATHETER LAUNCHER 6FR EBU3.5 guide catheter was inserted. WIRE VIPERWIRE COR FLEX TIP guidewire was used to cross lesion. Orbital atherectomy was performed using a CROWN DIAMONDBACK CLASSIC 1.25.  Post-Intervention Lesion Assessment  The intervention was successful. Pre-interventional TIMI flow is 3. Post-intervention TIMI flow is 3. No complications occurred at this lesion.  There is a 0% residual stenosis post intervention.  1st Mrg-2 lesion  Stent  CATHETER LAUNCHER 6FR EBU3.5 guide catheter was inserted. Lesion crossed with guidewire using a WIRE ASAHI PROWATER 180CM. Pre-stent angioplasty was performed using a BALLOON SAPPHIRE 2.5X12. A drug-eluting stent was successfully placed using a STENT RESOLUTE ONYX 2.5X18. Stent strut is well apposed. Post-stent angioplasty was performed using a BALLOON SAPPHIRE [place] 3.0X8. Maximum pressure: 16 atm.  Post-Intervention Lesion Assessment  The intervention was successful. Pre-interventional TIMI flow is 3. Post-intervention TIMI flow is 3. No complications occurred at this lesion.  There is a 0% residual stenosis post intervention.  Coronary Diagrams  Diagnostic Dominance: Right  Intervention        CT  ANGIOGRAPHY CHEST, ABDOMEN AND PELVIS  TECHNIQUE: Multidetector CT imaging through the chest, abdomen and pelvis was performed using the standard protocol during bolus administration of intravenous contrast. Multiplanar reconstructed images and MIPs were obtained and reviewed to evaluate the vascular anatomy.  CONTRAST:  135m OMNIPAQUE IOHEXOL 350 MG/ML SOLN  COMPARISON:  No priors.  FINDINGS: CTA CHEST FINDINGS  Cardiovascular: Heart size is borderline enlarged. There is no significant pericardial fluid, thickening or pericardial calcification. There is aortic atherosclerosis, as well as atherosclerosis of the great vessels of the mediastinum and the coronary arteries, including calcified atherosclerotic plaque  in the left main, left anterior descending, left circumflex and right coronary arteries. Thickening calcification of the aortic valve.  Mediastinum/Lymph Nodes: No pathologically enlarged mediastinal or hilar lymph nodes. Esophagus is unremarkable in appearance. No axillary lymphadenopathy.  Lungs/Pleura: Patchy areas of mild scarring in the medial aspects of the lower lobes of the lungs bilaterally. No acute consolidative airspace disease. No pleural effusions. No suspicious appearing pulmonary nodules or masses are noted.  Musculoskeletal/Soft Tissues: There are no aggressive appearing lytic or blastic lesions noted in the visualized portions of the skeleton.  CTA ABDOMEN AND PELVIS FINDINGS  Hepatobiliary: Liver has a shrunken appearance and nodular contour, indicative of underlying cirrhosis. No suspicious cystic or solid hepatic lesions. No significant intra or extrahepatic biliary ductal dilatation. Status post cholecystectomy.  Pancreas: No pancreatic mass. No pancreatic ductal dilatation. No pancreatic or peripancreatic fluid collections or inflammatory changes.  Spleen: Unremarkable.  Adrenals/Urinary Tract: Low-attenuation lesions in both kidneys, compatible with simple cysts, largest of which measures 2.5 cm in the lower pole the right kidney. Other subcentimeter low-attenuation lesions in both kidneys, too small to characterize, but statistically likely to represent tiny cysts. Bilateral adrenal glands are normal in appearance. No hydroureteronephrosis. Urinary bladder is normal in appearance.  Stomach/Bowel: Normal appearance of the stomach. No pathologic dilatation of small bowel or colon. Numerous colonic diverticulae are noted, without surrounding inflammatory changes to suggest an acute diverticulitis at this time The appendix is not confidently identified and may be surgically absent. Regardless, there are no inflammatory changes noted adjacent to  the cecum to suggest the presence of an acute appendicitis at this time.  Vascular/Lymphatic: Aortic atherosclerosis, without evidence of aneurysm or dissection in the abdominal or pelvic vasculature. Vascular findings and measurements pertinent to potential TAVR procedure, as detailed below. Severe stenosis of the distal infrarenal abdominal aorta immediately before the level of the bifurcation (9 x 5 mm). Complete occlusion of the right superficial femoral artery at the ostium. Distal reconstitution of flow in the right superficial femoral artery, presumably from collateral vessels. No lymphadenopathy noted in the abdomen or pelvis.  Reproductive: Uterus and ovaries are atrophic but otherwise unremarkable in appearance.  Other: No significant volume of ascites.  No pneumoperitoneum.  Musculoskeletal: Status post PLIF at L4-L5 with interbody cages at the L4-L5 interspace. There are no aggressive appearing lytic or blastic lesions noted in the visualized portions of the skeleton.  VASCULAR MEASUREMENTS PERTINENT TO TAVR:  AORTA:  Minimal Aortic Diameter-9 x 5 mm  Severity of Aortic Calcification-severe  RIGHT PELVIS:  Right Common Iliac Artery -  Minimal Diameter-3.4 x 3.5 mm  Tortuosity - mild  Calcification-severe  Right External Iliac Artery -  Minimal Diameter-4.3 x 6.1 mm  Tortuosity - mild  Calcification-mild  Right Common Femoral Artery -  Minimal Diameter-5.7 x 4.0 mm  Tortuosity - mild  Calcification-moderate  LEFT PELVIS:  Left Common Iliac Artery -  Minimal  Diameter-5.5 x 5.8 mm  Tortuosity - mild  Calcification-severe  Left External Iliac Artery -  Minimal Diameter-5.9 x 4.6 mm  Tortuosity - mild  Calcification-mild  Left Common Femoral Artery -  Minimal Diameter-5.4 x 3.6 mm  Tortuosity-mild  Calcification-moderate  Review of the MIP images confirms the above findings.  IMPRESSION: 1.  Vascular findings and measurements pertinent to potential TAVR procedure, as detailed above. 2. Severe thickening calcification of the aortic valve, compatible with the reported clinical history of severe aortic stenosis. 3. Aortic atherosclerosis, in addition to left main and 3 vessel coronary artery disease. 4. Morphologic changes in the liver suggestive of cirrhosis. 5. Colonic diverticulosis without evidence of acute diverticulitis at this time. 6. Additional incidental findings, as above.   Electronically Signed   By: Vinnie Langton M.D.   On: 06/09/2019 12:32    Impression:  Patient has stage D severe symptomatic aortic stenosis and multivessel coronary artery disease.  She describes a long history of worsening symptoms of exertional shortness of breath and fatigue consistent with chronic diastolic congestive heart failure, New York Heart Association functional class III.  She is also having frequent episodes of substernal chest pain relieved with nitroglycerin consistent with angina pectoris.    I have personally reviewed the patient's recent transthoracic echocardiogram, diagnostic cardiac catheterization and subsequent PCI studies, and CT angiograms performed earlier today.  Transthoracic echocardiogram demonstrates normal left ventricular systolic function with severe aortic stenosis and moderate aortic insufficiency.  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  Severity of aortic stenosis is exacerbated by the presence of relatively small diameter aortic root.  The aortic valve is trileaflet with moderate to severe thickening, calcification, and restricted leaflet mobility involving all 3 leaflets.  There is significant aortic insufficiency.  Peak velocity across aortic valve measured greater than 4.1 m/s corresponding to mean transvalvular gradient estimated 32 mmHg and aortic  valve area 0.8 cm.  The DVI was reported 0.33 which is not surprising given the relatively small size of the aortic root and the presence of significant aortic insufficiency.  Aortic insufficiency pressure half-time measured 356 ms.  There appears to be mild to moderate mitral regurgitation.  Diagnostic cardiac catheterization reveals multivessel coronary artery disease with continued patency of stents in multiple locations but significant ostial stenosis of left circumflex coronary artery as well as stenosis of proximal segment of the stent in the obtuse marginal branch, both of which were treated last week with atherectomy and PCI with stenting.  Catheterization was also notable for the presence of moderate pulmonary hypertension.  I agree the patient would benefit from aortic valve replacement.  Risks associated with conventional surgery would be somewhat elevated because of the patient's advanced age, somewhat limited mobility, and significant calcification in the ascending thoracic aorta.  Moreover, the patient has very small size aortic root and would likely require aortic root replacement to avoid the development of patient prosthesis mismatch.  Cardiac-gated CTA of the heart reveals anatomical characteristics consistent with aortic stenosis suitable for treatment by transcatheter aortic valve replacement without any significant complicating features other than the fact that the patient's aortic root is quite small.  CTA of the aorta and iliac vessels confirms the presence of significant peripheral arterial disease with stenosis of the distal aorta and both common iliac arteries.  Although most common iliac arteries are patent patient may not have adequate pelvic vascular access to facilitate transfemoral  approach.  Left transsubclavian approach may be feasible although there is some question possible stenosis of the left subclavian artery just as it exits into the axilla.  I would be reluctant to consider  transcarotid approach given the patient's history of previous bilateral carotid endarterectomies.  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   Plan:  The patient and her family were counseled at length regarding treatment alternatives for management of severe symptomatic aortic stenosis. Alternative approaches such as conventional aortic valve replacement, transcatheter aortic valve replacement, and continued medical therapy without intervention were compared and contrasted at length.  The risks associated with conventional surgical aortic valve replacement were discussed in detail, as were expectations for post-operative convalescence, and why I would be reluctant to consider this patient a candidate for conventional surgery.  Issues specific to transcatheter aortic valve replacement were discussed including questions about long term valve durability, the potential for paravalvular leak, possible increased risk of need for permanent pacemaker placement, and other technical complications related to the procedure itself.  Long-term prognosis with medical therapy was discussed. This discussion was placed in the context of the patient's own specific clinical presentation and past medical history.  All of their questions have been addressed.  The patient hopes to proceed with transcatheter aortic valve replacement in the near future.  We tentatively plan to proceed on June 24, 2019.  Following the decision to proceed with transcatheter aortic valve replacement, a discussion has been held regarding what types of management strategies would be attempted intraoperatively in the event of life-threatening complications, including whether or not the patient would be considered a candidate for the use of cardiopulmonary bypass and/or  conversion to open sternotomy for attempted surgical intervention.  The patient specifically requests that should a potentially life-threatening complication develop we would attempt emergency median sternotomy and/or other aggressive surgical procedures.  The patient has been advised of a variety of complications that might develop including but not limited to risks of death, stroke, paravalvular leak, aortic dissection or other major vascular complications, aortic annulus rupture, device embolization, cardiac rupture or perforation, mitral regurgitation, acute myocardial infarction, arrhythmia, heart block or bradycardia requiring permanent pacemaker placement, congestive heart failure, respiratory failure, renal failure, pneumonia, infection, other late complications related to structural valve deterioration or migration, or other complications that might ultimately cause a temporary or permanent loss of functional independence or other long term morbidity.  The patient provides full informed consent for the procedure as described and all questions were answered.    I spent in excess of 90 minutes during the conduct of this office consultation and >50% of this time involved direct face-to-face encounter with the patient for counseling and/or coordination of their care.      Valentina Gu. Roxy Manns, MD 06/09/2019 12:38 PM

## 2019-06-09 NOTE — Patient Instructions (Signed)
  Continue taking all current medications without change through the day before surgery.  Make sure to bring all of your medications with you when you come for your Pre-Admission Testing appointment at [facility] Short-Stay Department.  Have nothing to eat or drink after midnight the night before surgery.  On the morning of surgery take only Synthroid and Clonidine with a sip of water.  At your upcoming appointment for Pre-Admission Testing at the [facility] Short-Stay Department you will be asked to sign permission forms for your upcoming surgery.    By definition your signature on these permission forms implies that you and/or your designee provide full informed consent for your upcoming planned surgical procedure(s), that alternative treatment options have been discussed, that you understand and accept any and all potential risks, and that you have some understanding of what to expect for your post-operative convalescence.  For any major cardiac surgical procedure operative risks include but are not limited to at least a small risk of death, stroke or other neurologic complication, myocardial infarction, congestive heart failure, respiratory failure, renal failure, bleeding requiring blood transfusion and/or reexploration, irregular heart rhythm, heart block or bradycardia requiring permanent pacemaker, pneumonia, pericardial effusion, pleural effusion, wound infection, pulmonary embolus or other thromboembolic complication, chronic pain, or other delayed complications related to the specific procedure(s) performed.  Additional risks specific to transcatheter aortic valve replacement also include but are not limited to risk of paravalvular leak, valve embolization, valve thrombosis, aortic dissection, aortic rupture, ventricular perforation, pericardial tamponade, rupture and/or dissection of the abdominal aorta or its branches, and/or injury or occlusion of the  arteries going to one or both legs.  Please schedule a follow-up appointment in our office prior to your surgery if you have any unresolved questions about your planned surgical procedure and/or the associated risks.

## 2019-06-10 ENCOUNTER — Other Ambulatory Visit: Payer: Self-pay | Admitting: Cardiovascular Disease

## 2019-06-11 ENCOUNTER — Ambulatory Visit: Payer: Medicare Other | Admitting: Cardiovascular Disease

## 2019-06-17 ENCOUNTER — Other Ambulatory Visit: Payer: Self-pay

## 2019-06-17 DIAGNOSIS — I350 Nonrheumatic aortic (valve) stenosis: Secondary | ICD-10-CM

## 2019-06-19 ENCOUNTER — Other Ambulatory Visit: Payer: Self-pay | Admitting: *Deleted

## 2019-06-19 ENCOUNTER — Other Ambulatory Visit: Payer: Self-pay

## 2019-06-19 MED ORDER — POTASSIUM CHLORIDE ER 10 MEQ PO TBCR: 10.0000 meq | EXTENDED_RELEASE_TABLET | Freq: Every day | ORAL | 1 refills | Status: DC

## 2019-06-19 MED ORDER — CLOPIDOGREL BISULFATE 75 MG PO TABS: 75.0000 mg | ORAL_TABLET | Freq: Every day | ORAL | 1 refills | Status: DC

## 2019-06-19 MED ORDER — FUROSEMIDE 20 MG PO TABS: 20.0000 mg | ORAL_TABLET | Freq: Every day | ORAL | 1 refills | Status: DC

## 2019-06-19 NOTE — Progress Notes (Signed)
[facility][id number] Victoria Burns [address] [place] [address] Phone: [phone] Fax: [phone]     Your procedure is scheduled on Tuesday, December 8th, 2020.   Report to [facility] Main Entrance "A" at 8:50 A.M., and check in at the Admitting office.   Call this number if you have problems the morning of surgery:  [phone]  Call [phone] if you have any questions prior to your surgery date Monday-Friday 8am-4pm    Remember:  Do not eat or drink after midnight the night before your surgery   Take these medicines the morning of surgery with A SIP OF WATER :  Clonidine (Catapres) Levothyroxine (Synthroid)  7 days prior to surgery STOP taking any Aspirin (unless otherwise instructed by your surgeon), Aleve, Naproxen, Ibuprofen, Motrin, Advil, Goody's, BC's, all herbal medications, fish oil, and all vitamins.    The Morning of Surgery  Do not wear jewelry, make-up or nail polish.  Do not wear lotions, powders, or perfumes/colognes, or deodorant  Do not shave 48 hours prior to surgery.  Men may shave face and neck.  Do not bring valuables to the hospital.  [facility] is not responsible for any belongings or valuables.  If you are a smoker, DO NOT Smoke 24 hours prior to surgery  If you wear a CPAP at night please bring your mask, tubing, and machine the morning of surgery   Remember that you must have someone to transport you home after your surgery, and remain with you for 24 hours if you are discharged the same day.   Please bring cases for contacts, glasses, hearing aids, dentures or bridgework because it cannot be worn into surgery.    Leave your suitcase in the car.  After surgery it may be brought to your room.  For patients admitted to the hospital, discharge time will be determined by your treatment team.  Patients discharged the day of surgery will not be allowed to drive home.    Special instructions:   [facility]- Preparing For Surgery  Before surgery, you can play an important role. Because skin is not sterile, your skin needs to be as free of germs as possible. You can reduce the number of germs on your skin by washing with CHG (chlorahexidine gluconate) Soap before surgery.  CHG is an antiseptic cleaner which kills germs and bonds with the skin to continue killing germs even after washing.    Oral Hygiene is also important to reduce your risk of infection.  Remember - BRUSH YOUR TEETH THE MORNING OF SURGERY WITH YOUR REGULAR TOOTHPASTE  Please do not use if you have an allergy to CHG or antibacterial soaps. If your skin becomes reddened/irritated stop using the CHG.  Do not shave (including legs and underarms) for at least 48 hours prior to first CHG shower. It is OK to shave your face.  Please follow these instructions carefully.   1. Shower the NIGHT BEFORE SURGERY and the MORNING OF SURGERY with CHG Soap.   2. If you chose to wash your hair, wash your hair first as usual with your normal shampoo.  3. After you shampoo, rinse your hair and body thoroughly to remove the shampoo.  4. Use CHG as you would any other liquid soap. You can apply CHG directly to the skin and wash gently with a scrungie or a clean washcloth.   5. Apply the CHG Soap to your body ONLY FROM THE NECK DOWN.  Do  not use on open wounds or open sores. Avoid contact with your eyes, ears, mouth and genitals (private parts). Wash Face and genitals (private parts)  with your normal soap.   6. Wash thoroughly, paying special attention to the area where your surgery will be performed.  7. Thoroughly rinse your body with warm water from the neck down.  8. DO NOT shower/wash with your normal soap after using and rinsing off the CHG Soap.  9. Pat yourself dry with a CLEAN TOWEL.  10. Wear CLEAN PAJAMAS to bed the night before surgery, wear comfortable clothes the morning of surgery  11. Place CLEAN SHEETS on your bed the  night of your first shower and DO NOT SLEEP WITH PETS.    Day of Surgery:  Please shower the morning of surgery with the CHG soap Do not apply any deodorants/lotions. Please wear clean clothes to the hospital/surgery center.   Remember to brush your teeth WITH YOUR REGULAR TOOTHPASTE.   Please read over the following fact sheets that you were given.

## 2019-06-20 ENCOUNTER — Other Ambulatory Visit: Payer: Self-pay

## 2019-06-20 ENCOUNTER — Encounter (HOSPITAL_COMMUNITY): Payer: Self-pay

## 2019-06-20 ENCOUNTER — Encounter (HOSPITAL_COMMUNITY)
Admission: RE | Admit: 2019-06-20 | Discharge: 2019-06-20 | Disposition: A | Discharge status: Home / Self Care | Payer: Medicare Other | Source: Ambulatory Visit | Attending: Cardiovascular Disease | Admitting: Cardiovascular Disease

## 2019-06-20 ENCOUNTER — Other Ambulatory Visit (HOSPITAL_COMMUNITY)
Admission: RE | Admit: 2019-06-20 | Discharge: 2019-06-20 | Disposition: A | Discharge status: Home / Self Care | Payer: Medicare Other | Source: Ambulatory Visit | Attending: Cardiovascular Disease | Admitting: Cardiovascular Disease

## 2019-06-20 ENCOUNTER — Encounter: Payer: Self-pay | Admitting: Physical Therapy

## 2019-06-20 ENCOUNTER — Ambulatory Visit: Payer: Medicare Other | Attending: Cardiovascular Disease | Admitting: Physical Therapy

## 2019-06-20 ENCOUNTER — Ambulatory Visit (HOSPITAL_COMMUNITY)
Admission: RE | Admit: 2019-06-20 | Discharge: 2019-06-20 | Disposition: A | Discharge status: Home / Self Care | Payer: Medicare Other | Source: Ambulatory Visit | Attending: Cardiovascular Disease | Admitting: Cardiovascular Disease

## 2019-06-20 DIAGNOSIS — I350 Nonrheumatic aortic (valve) stenosis: Secondary | ICD-10-CM | POA: Diagnosis not present

## 2019-06-20 DIAGNOSIS — Z20828 Contact with and (suspected) exposure to other viral communicable diseases: Secondary | ICD-10-CM | POA: Diagnosis not present

## 2019-06-20 DIAGNOSIS — R2689 Other abnormalities of gait and mobility: Secondary | ICD-10-CM | POA: Insufficient documentation

## 2019-06-20 DIAGNOSIS — Z01818 Encounter for other preprocedural examination: Secondary | ICD-10-CM | POA: Insufficient documentation

## 2019-06-20 HISTORY — DX: Sleep apnea, unspecified: G47.30

## 2019-06-20 LAB — BLOOD GAS, ARTERIAL
Acid-Base Excess: 0.5 mmol/L (ref 0.0–2.0)
Bicarbonate: 24.6 mmol/L (ref 20.0–28.0)
Drawn by: 42180
FIO2: 21
O2 Saturation: 97.7 %
Patient temperature: 37
pCO2 arterial: 40 mmHg (ref 32.0–48.0)
pH, Arterial: 7.406 (ref 7.350–7.450)
pO2, Arterial: 103 mmHg (ref 83.0–108.0)

## 2019-06-20 LAB — URINALYSIS, ROUTINE W REFLEX MICROSCOPIC
Bilirubin Urine: NEGATIVE
Glucose, UA: NEGATIVE mg/dL
Hgb urine dipstick: NEGATIVE
Ketones, ur: NEGATIVE mg/dL
Leukocytes,Ua: NEGATIVE
Nitrite: NEGATIVE
Protein, ur: NEGATIVE mg/dL
Specific Gravity, Urine: 1.012 (ref 1.005–1.030)
pH: 5 (ref 5.0–8.0)

## 2019-06-20 LAB — HEMOGLOBIN A1C
Hgb A1c MFr Bld: 6.1 % — ABNORMAL HIGH (ref 4.8–5.6)
Mean Plasma Glucose: 128.37 mg/dL

## 2019-06-20 LAB — TYPE AND SCREEN
ABO/RH(D): A POS
Antibody Screen: NEGATIVE

## 2019-06-20 LAB — PROTIME-INR
INR: 0.9 (ref 0.8–1.2)
Prothrombin Time: 12.4 seconds (ref 11.4–15.2)

## 2019-06-20 LAB — CBC
HCT: 39.7 % (ref 36.0–46.0)
Hemoglobin: 13 g/dL (ref 12.0–15.0)
MCH: 33.2 pg (ref 26.0–34.0)
MCHC: 32.7 g/dL (ref 30.0–36.0)
MCV: 101.3 fL — ABNORMAL HIGH (ref 80.0–100.0)
Platelets: 260 10*3/uL (ref 150–400)
RBC: 3.92 MIL/uL (ref 3.87–5.11)
RDW: 14.6 % (ref 11.5–15.5)
WBC: 7.6 10*3/uL (ref 4.0–10.5)
nRBC: 0 % (ref 0.0–0.2)

## 2019-06-20 LAB — COMPREHENSIVE METABOLIC PANEL
ALT: 20 U/L (ref 0–44)
AST: 25 U/L (ref 15–41)
Albumin: 3.9 g/dL (ref 3.5–5.0)
Alkaline Phosphatase: 64 U/L (ref 38–126)
Anion gap: 9 (ref 5–15)
BUN: 15 mg/dL (ref 8–23)
CO2: 22 mmol/L (ref 22–32)
Calcium: 10 mg/dL (ref 8.9–10.3)
Chloride: 108 mmol/L (ref 98–111)
Creatinine, Ser: 0.87 mg/dL (ref 0.44–1.00)
GFR calc Af Amer: >60 mL/min (ref >60)
GFR calc non Af Amer: >60 mL/min (ref >60)
Glucose, Bld: 107 mg/dL — ABNORMAL HIGH (ref 70–99)
Potassium: 4.2 mmol/L (ref 3.5–5.1)
Sodium: 139 mmol/L (ref 135–145)
Total Bilirubin: 0.8 mg/dL (ref 0.3–1.2)
Total Protein: 6.8 g/dL (ref 6.5–8.1)

## 2019-06-20 LAB — SURGICAL PCR SCREEN
MRSA, PCR: NEGATIVE
Staphylococcus aureus: NEGATIVE

## 2019-06-20 LAB — BRAIN NATRIURETIC PEPTIDE: B Natriuretic Peptide: 123.8 pg/mL — ABNORMAL HIGH (ref 0.0–100.0)

## 2019-06-20 LAB — ABO/RH: ABO/RH(D): A POS

## 2019-06-20 LAB — APTT: aPTT: 27 seconds (ref 24–36)

## 2019-06-20 NOTE — Progress Notes (Signed)
PCP - Alma Friendly, NP Cardiologist - Dr. Rockey Situ  PPM/ICD - n/a Device Orders -  Rep Notified -   Chest x-ray - 06/20/2019 EKG - 06/20/2019 Stress Test - 07/24/2016 ECHO - 05/06/2019 Cardiac Cath - 05/23/2019, 06/05/2019, 06/06/2019  Sleep Study - patient states she has had a sleep study in the past, believes in was 5-10 years ago but not positive, in [place].  Patient does not recall where it was done. CPAP - no, patient states she cannot tolerate CPAP  Fasting Blood Sugar - n/a Checks Blood Sugar _____ times a day  Blood Thinner Instructions: Plavix - per order patient is to continue all medications up to day before surgery Aspirin Instructions: per order patient is to continue all medications up to day before surgery   ERAS Protcol -n/a PRE-SURGERY Ensure or G2-   COVID TEST- 06/20/2019 after PAT appointment   Anesthesia review: yes  Patient denies shortness of breath, fever, cough and chest pain at PAT appointment   All instructions explained to the patient, with a verbal understanding of the material. Patient agrees to go over the instructions while at home for a better understanding. Patient also instructed to self quarantine after being tested for COVID-19. The opportunity to ask questions was provided.

## 2019-06-20 NOTE — Therapy (Signed)
[place], [place], [address] Phone: [phone]   Fax:  [phone]  Physical Therapy Evaluation  Patient Details  Name: Victoria Burns MRN: [id number] Date of Birth: 1940/12/10 Referring Provider (PT): Burnell Blanks, MD   Encounter Date: 06/20/2019  PT End of Session - 06/20/19 0820    Visit Number  1    Number of Visits  1    PT Start Time  0815    PT Stop Time  0845    PT Time Calculation (min)  30 min    Equipment Utilized During Treatment  Gait belt    Activity Tolerance  Patient tolerated treatment well    Behavior During Therapy  [facility] for tasks assessed/performed       Past Medical History:  Diagnosis Date  . Arthritis   . CAD (coronary artery disease)    a. s/p prior stenting; b. 05/2017 Cath ([facility]): LAD mild irregs, LCX 47m, OM2 40 ISR, RCA dominant, RPLV 50; c. 02/2018 MV: EF 64%. No ischemia/scar.  . Carotid arterial disease ([place])    a. 02/2018 Carotid U/S: 1-39% bilat ICA stenosis. F/u prn.  . Chronic back pain   . Hyperlipidemia   . Hypertension   . Hypothyroidism   . Juxtafoveal telangietasis of both eyes   . Moderate to severe aortic stenosis    a. 04/2016 Echo: Mod AS; b. 02/2018 Echo: EF 55-60%, no rwma, Gr1 DD. Mod to sev AS. Mod AI. Mean grad 54mmHg. Valve area (VTI): 0.71 cm^2.  Marland Kitchen RBBB   . TIA (transient ischemic attack)     Past Surgical History:  Procedure Laterality Date  . APPENDECTOMY    . BACK SURGERY  08/2016  . BREAST SURGERY     Biopsy  . CAROTID ENDARTERECTOMY Bilateral   . CHOLECYSTECTOMY    . CORONARY ANGIOGRAPHY N/A 06/05/2019   Procedure: CORONARY ANGIOGRAPHY (CATH LAB);  Surgeon: Burnell Blanks, MD;  Location: [facility];  Service: Cardiovascular;  Laterality: N/A;  . CORONARY ATHERECTOMY N/A 06/06/2019   Procedure: CORONARY ATHERECTOMY;  Surgeon: Martinique, Peter M, MD;  Location: [place] [facility];  Service:  Cardiovascular;  Laterality: N/A;  . RIGHT/LEFT HEART CATH AND CORONARY ANGIOGRAPHY Bilateral 05/23/2019   Procedure: RIGHT/LEFT HEART CATH AND CORONARY ANGIOGRAPHY;  Surgeon: Minna Merritts, MD;  Location: [facility];  Service: Cardiovascular;  Laterality: Bilateral;  . TONSILLECTOMY AND ADENOIDECTOMY      There were no vitals filed for this visit.   Subjective Assessment - 06/20/19 0816    Subjective  Patient reports a long history of symptoms of exertional chest pain and shortness of breath dating back at least 2 or 3 years.  Symptoms have reportedly gotten much worse over the past 6 months. She now gets short of breath with very low level activity and she gets substernal chest discomfort on a frequent basis.  Chest pain is usually short-lived and promptly relieved with sublingual nitroglycerin. She has not had dizzy spells or syncope.    Pertinent History  History of rheumatic fever in the distant past, aortic stenosis, coronary artery disease status post multiple previous PCI and stenting procedures including recent atherectomy and stenting of the left circumflex coronary artery, cerebrovascular disease status post TIA and bilateral carotid endarterectomies in the remote past, hypertension, right bundle    Limitations  House hold activities;Walking    Patient Stated Goals  Get heart better and endurance improved  Currently in Pain?  No/denies         [facility] PT Assessment - 06/20/19 0001      Assessment   Medical Diagnosis  Severe Aortic Stenosis    Referring Provider (PT)  Burnell Blanks, MD    Onset Date/Surgical Date  --   worsened in past 6 months   Hand Dominance  Right      Precautions   Precautions  None      Restrictions   Weight Bearing Restrictions  No      Balance Screen   Has the patient fallen in the past 6 months  No    Has the patient had a decrease in activity level because of a fear of falling?   No    Is the patient reluctant to leave  their home because of a fear of falling?   No      Home Environment   Living Environment  Private residence    Living Arrangements  Spouse/significant other    Type of [place] Access  Level entry    [place]  One level      Prior Function   Level of Independence  Independent with basic ADLs    Vocation  Retired      Associate Professor   Overall Cognitive Status  Within Functional Limits for tasks assessed      Observation/Other Assessments   Observations  Patient pleasant and in no apparent distress      Sensation   Light Touch  Appears Intact      Posture/Postural Control   Posture/Postural Control  No significant limitations      ROM / Strength   AROM / PROM / Strength  AROM;Strength      AROM   Overall AROM Comments  AROM of UE and LE grossly WFL with no pain       Strength   Overall Strength Comments  Overall UE and LE strength grossly 4+/5 throughout     Strength Assessment Site  Hand    Right/Left hand  Right;Left    Right Hand Grip (lbs)  10    Left Hand Grip (lbs)  12      Transfers   Transfers  Independent with all Transfers      Ambulation/Gait   Ambulation/Gait  Yes    Ambulation/Gait Assistance  7: Independent    Ambulation Distance (Feet)  555 Feet    Gait Comments  Slow cadence, coxalgic gait       [facility] Pre-Surgical Assessment - 06/20/19 0001    5 Meter Walk Test- trial 1  6 sec    5 Meter Walk Test- trial 2  5 sec.     5 Meter Walk Test- trial 3  5 sec.   5/10 Dyspnea scale   5 meter walk test average  5.33 sec    4 Stage Balance Test tolerated for:   8 sec.    4 Stage Balance Test Position  3    comment  Able to maintain full tandem 8 seconds    Sit To Stand Test- trial 1  16 sec.    Comment  Did not require use of UE    ADL/IADL Independent with:  Bathing;Dressing;Meal prep;Finances    ADL/IADL Needs Assistance with:  [place] work    ADL/IADL Fraility Index  Vulnerable    6 Minute Walk- Baseline  yes    BP (mmHg)  186/69    HR (bpm)  55    02 Sat (%RA)  99 %    Modified Borg Scale for Dyspnea  0- Nothing at all    Perceived Rate of Exertion (Borg)  6-    6 Minute Walk Post Test  yes    BP (mmHg)  166/68    HR (bpm)  72    02 Sat (%RA)  96 %    Modified Borg Scale for Dyspnea  9- Extremely severe    Perceived Rate of Exertion (Borg)  17- Very hard    Aerobic Endurance Distance Walked  555    Endurance additional comments  Patient required rest break at 2 minutes for 1:30 minutes, 94 O2, 74 HR, 7/10 dyspnea scale, 14 RPE              Objective measurements completed on examination: See above findings.              PT Education - 06/20/19 0819    Education Details  Activity modfication to avoid fatigue    Person(s) Educated  Patient    Methods  Explanation    Comprehension  Verbalized understanding                  Plan - 06/20/19 0853    Clinical Impression Statement  See below    Personal Factors and Comorbidities  Age;Comorbidity 3+;Fitness    Comorbidities  history of rheumatic fever in the distant past, aortic stenosis, coronary artery disease status post multiple previous PCI and stenting procedures including recent atherectomy and stenting of the left circumflex coronary artery, cerebrovascular disease status post TIA and bilateral carotid endarterectomies in the remote past, hypertension, right bundle branch block, hyperlipidemia, and hypothyroidism    Examination-Activity Limitations  Locomotion Level    Examination-Participation Restrictions  Community Activity;Yard Work    Stability/Clinical Decision Making  Stable/Uncomplicated    Clinical Decision Making  Low    PT Frequency  One time visit    PT Treatment/Interventions  ADLs/Self Care Home Management;Gait training    PT Next Visit Plan  N/A - one time visit    PT Home Exercise Plan  N/A    Consulted and Agree with Plan of Care  Patient      Clinical Impression Statement: Pt is a 78 yo female presenting to OP PT for  evaluation prior to possible TAVR surgery due to severe aortic stenosis. Pt reports onset of shortness of breath, fatigue, and chest pain approximately 6 (2-3 year history) months ago. Symptoms are limiting walking and light activity around home. Pt presents with good ROM and strength, good balance and is not at high fall risk 4 stage balance test, good walking speed and poor aerobic endurance per 6 minute walk test. Pt ambulated  feet in 215 before requesting a seated rest beak lasting 1:30. At time of rest, patient's HR was 76 bpm and O2 was 94 on room air. Pt reported 7/10 shortness of breath on modified scale for dyspnea. Pt able to resume after rest and ambulate an additional 340 feet. Pt ambulated a total of 555 feet in 6 minute walk. Shortness of break and RPE increased significantly with 6 minute walk test. Based on the Short Physical Performance Battery, patient has a frailty rating of 9/12 with </= 5/12 considered frail.   Patient demonstrated the following deficits and impairments:  Abnormal gait, Cardiopulmonary status limiting activity, Decreased endurance  Visit Diagnosis: Other abnormalities of gait and mobility     Problem List Patient Active  Problem List   Diagnosis Date Noted  . Unstable angina ([place]) 05/23/2019  . Bilateral carotid artery stenosis 10/21/2018  . Aortic valve disease 10/21/2018  . Acute shoulder pain 04/08/2018  . Osteoarthritis 01/23/2018  . Coronary artery disease of native artery of native heart with stable angina pectoris ([place]) 08/18/2017  . PAD (peripheral artery disease) ([place]) 08/18/2017  . Carotid stenosis 08/18/2017  . Hypothyroidism 07/24/2017  . Mixed hyperlipidemia 07/24/2017  . Essential hypertension 07/24/2017    Hilda Blades, PT, DPT, LAT, ATC 06/20/19  8:59 AM Phone: [phone] Fax: [facility] [address] [place], [place], [address] Phone: [phone]   Fax:   [phone]  Name: Victoria Burns MRN: [id number] Date of Birth: 05-08-1941

## 2019-06-21 ENCOUNTER — Other Ambulatory Visit: Payer: Self-pay | Admitting: Primary Care

## 2019-06-21 DIAGNOSIS — E039 Hypothyroidism, unspecified: Secondary | ICD-10-CM

## 2019-06-21 LAB — NOVEL CORONAVIRUS, NAA (HOSP ORDER, SEND-OUT TO REF LAB; TAT 18-24 HRS): SARS-CoV-2, NAA: NOT DETECTED

## 2019-06-23 ENCOUNTER — Ambulatory Visit: Payer: Medicare Other | Admitting: Cardiovascular Disease

## 2019-06-23 MED ORDER — POTASSIUM CHLORIDE 2 MEQ/ML IV SOLN
80.0000 meq | INTRAVENOUS | Status: DC
  Filled 2019-06-23: qty 40

## 2019-06-23 MED ORDER — VANCOMYCIN HCL 10 G IV SOLR
1500.0000 mg | INTRAVENOUS | Status: AC
  Administered 2019-06-24: 1500 mg via INTRAVENOUS
  Filled 2019-06-23: qty 1500

## 2019-06-23 MED ORDER — DEXMEDETOMIDINE HCL IN NACL 400 MCG/100ML IV SOLN
0.1000 ug/kg/h | INTRAVENOUS | Status: DC
  Filled 2019-06-23: qty 100

## 2019-06-23 MED ORDER — SODIUM CHLORIDE 0.9 % IV SOLN
1.5000 g | INTRAVENOUS | Status: AC
  Administered 2019-06-24: 1.5 g via INTRAVENOUS
  Filled 2019-06-23 (×2): qty 1.5

## 2019-06-23 MED ORDER — NOREPINEPHRINE 4 MG/250ML-% IV SOLN
0.0000 ug/min | INTRAVENOUS | Status: AC
  Administered 2019-06-24: 2 ug/min via INTRAVENOUS
  Filled 2019-06-23: qty 250

## 2019-06-23 MED ORDER — SODIUM CHLORIDE 0.9 % IV SOLN
INTRAVENOUS | Status: DC
  Filled 2019-06-23: qty 30

## 2019-06-23 MED ORDER — MAGNESIUM SULFATE 50 % IJ SOLN
40.0000 meq | INTRAMUSCULAR | Status: DC
  Filled 2019-06-23: qty 9.85

## 2019-06-23 NOTE — H&P (Signed)
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   Patient has history of rheumatic fever during childhood and states that she has been told that she had a heart murmur for the majority of her entire adult life. In the [place] Approximately 2 years ago she relocated to [place] and she has been followed by Dr. Rockey Situ for the past year and a half. Previous echocardiograms have documented the presence of normal left ventricular systolic function with at least moderate to severe aortic stenosis. She describes a long history of symptoms of exertional chest pain and shortness of breath dating back at least 2 or 3 years. Symptoms have reportedly gotten much worse over the past 6 months. She now gets short of breath with very low level activity and she gets substernal chest discomfort on a frequent basis. Chest pain is usually short-lived and promptly relieved with sublingual nitroglycerin. She denies episodes of resting shortness of breath or orthopnea. She does have  lower extremity edema. She has not had dizzy spells or syncope.   Patient recently underwent follow-up echocardiogram May 06, 2019 which by report revealed normal left ventricular systolic function with ejection fraction estimated 55 to 60%, moderate to severe aortic stenosis with moderate aortic insufficiency. The patient was referred to the multidisciplinary heart valve clinic and has been evaluated previously by Dr. Angelena Form. Catheterization revealed multivessel coronary artery disease with severe disease of the obtuse marginal branch involving proximal edge of previously placed stent. Direct assessment of severity of aortic stenosis was not feasible. There was moderate pulmonary hypertension. The patient was also noted to have severe peripheral arterial disease with 80% ostial stenosis of the right common iliac artery and occluded right superficial femoral artery. The patient subsequently underwent multivessel PCI and stenting including orbital atherectomy of the ostial left circumflex with stenting of the ostial left circumflex, the proximal first obtuse marginal, and the distal obtuse marginal branch using a total of 3 drug-eluting stents. The patient has continued to experience episodes of substernal chest pain since her stent procedure last week, including 1 episode of chest pain yesterday evening which was promptly resolved with sublingual nitroglycerin.   Patient is married and lives locally in [place] with her husband. She lives a sedentary lifestyle but remains functionally independent. She is limited primarily by exertional shortness of breath and chest discomfort. She also has some arthritis with some pain in both ankles. However she walks without need for mechanical support or assistance. She does not exercise on a regular basis.  Past Medical History:  Diagnosis Date   Arthritis    CAD (coronary artery disease)    a. s/p prior stenting; b. 05/2017 Cath ([facility]):  LAD mild irregs, LCX 2m OM2 40 ISR, RCA dominant, RPLV 50; c. 02/2018 MV: EF 64%. No ischemia/scar.   Carotid arterial disease [place]    a. 02/2018 Carotid U/S: 1-39% bilat ICA stenosis. F/u prn.   Chronic back pain    Hyperlipidemia    Hypertension    Hypothyroidism    Juxtafoveal telangietasis of both eyes    Moderate to severe aortic stenosis    a. 04/2016 Echo: Mod AS; b. 02/2018 Echo: EF 55-60%, no rwma, Gr1 DD. Mod to sev AS. Mod AI. Mean grad 344mg. Valve area (VTI): 0.71 cm^2.   RBBB    TIA (transient ischemic attack)         Past Surgical History:  Procedure Laterality Date   APPENDECTOMY     BACK SURGERY  08/2016   BREAST SURGERY     Biopsy   CAROTID ENDARTERECTOMY Bilateral    CHOLECYSTECTOMY     CORONARY ANGIOGRAPHY N/A 06/05/2019   Procedure: CORONARY ANGIOGRAPHY (CATH LAB); Surgeon: McBurnell BlanksMD; [facility]; Service: Cardiovascular; Laterality: N/A;   CORONARY ATHERECTOMY N/A 06/06/2019   Procedure: CORONARY ATHERECTOMY; Surgeon: JoMartiniquePeter M, MD; [facility]; Service: Cardiovascular; Laterality: N/A;   RIGHT/LEFT HEART CATH AND CORONARY ANGIOGRAPHY Bilateral 05/23/2019   Procedure: RIGHT/LEFT HEART CATH AND CORONARY ANGIOGRAPHY; Surgeon: GoMinna MerrittsMD; [facility]; Service: Cardiovascular; Laterality: Bilateral;   TONSILLECTOMY AND ADENOIDECTOMY          Family History  Problem Relation Age of Onset   Heart disease Mother    Hyperlipidemia Mother    Hypertension Mother    Alcohol abuse Father    COPD Father    Heart attack Father    Arthritis Sister    Hypertension Sister    Cancer Sister    COPD Sister    Heart attack Sister    Heart disease Sister    Hypertension Sister    Social History        Socioeconomic History   Marital status: Married    Spouse name: Not on file   Number of children: 3   Years of education: Not on file    Highest education level: Not on file  Occupational History   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   Social NeDesigner, fashion/clothingtrain: Not on file   Food insecurity    Worry: Not on file    Inability: Not on file   Transportation needs    Medical: Not on file    Non-medical: Not on file  Tobacco Use   Smoking status: Former Smoker    Types: Cigarettes   Smokeless tobacco: Never Used  Substance and Sexual Activity   Alcohol use: No    Frequency: Never   Drug use: No   Sexual activity: Yes  Lifestyle   Physical activity    Days per week: Not on file    Minutes per session: Not on file   Stress: Not on file  Relationships   Social connections    Talks on phone: Not on file    Gets together: Not on file    Attends religious service: Not on file    Active member of club or organization: Not on file    Attends meetings of clubs or  organizations: Not on file    Relationship status: Not on file   Intimate partner violence    Fear of current or ex partner: Not on file    Emotionally abused: Not on file    Physically abused: Not on file    Forced sexual activity: Not on file  Other Topics Concern   Not on file  Social History Narrative   Not on file         Current Outpatient Medications  Medication Sig Dispense Refill   aspirin 81 MG chewable tablet Chew 81 mg by mouth daily.     atorvastatin (LIPITOR) 80 MG tablet Take 1 tablet (80 mg total) by mouth daily at 6 PM. 90 tablet 2   cloNIDine (CATAPRES) 0.1 MG tablet Take 1 tablet (0.1 mg total) by mouth 2 (two) times daily. 60 tablet 0   clopidogrel (PLAVIX) 75 MG tablet Take 1 tablet (75 mg total) by mouth daily. 90 tablet 3   furosemide (LASIX) 20 MG tablet Take 1 tablet (20 mg total) by mouth daily. 30 tablet 2   isosorbide mononitrate (IMDUR) 30 MG 24 hr tablet Take 1 tablet (30 mg total) by mouth 2 (two) times daily. 180 tablet 2   levothyroxine (SYNTHROID) 25 MCG tablet TAKE 1 TABLET BY  MOUTH EVERY MORNING ON AN EMPTY STOMACH WITH WATER - NO FOOD OR MEDICATIONS FOR 30 MINUTES (Patient taking differently: Take 25 mcg by mouth daily before breakfast. TAKE 1 TABLET BY MOUTH EVERY MORNING ON AN EMPTY STOMACH WITH WATER - NO FOOD OR MEDICATIONS FOR 30 MINUTES) 90 tablet 0   losartan (COZAAR) 100 MG tablet Take 1 tablet (100 mg total) by mouth daily. 90 tablet 2   metoprolol tartrate (LOPRESSOR) 25 MG tablet Take 0.5 tablets (12.5 mg total) by mouth 2 (two) times daily. 90 tablet 3   Misc Natural Products (FOCUSED MIND PO) Take 1 tablet by mouth daily.      Multiple Vitamins-Minerals (ZINC PO) Take 1 tablet by mouth daily.      nitroGLYCERIN (NITROSTAT) 0.3 MG SL tablet Place 1 tablet (0.3 mg total) under the tongue every 5 (five) minutes as needed. 25 tablet 1   Polyethyl Glycol-Propyl Glycol (SYSTANE) 0.4-0.3 % SOLN Place 1 drop into both eyes 2 (two) times daily as needed (Dry eye).     potassium chloride (KLOR-CON) 10 MEQ tablet Take 1 tablet (10 mEq total) by mouth daily. 30 tablet 2   VITAMIN D PO Take 1 tablet by mouth daily.      No current facility-administered medications for this visit.         Allergies  Allergen Reactions   Codeine Rash   Penicillins Rash    Did it involve swelling of the face/tongue/throat, SOB, or low BP? No  Did it involve sudden or severe rash/hives, skin peeling, or any reaction on the inside of your mouth or nose? No  Did you need to seek medical attention at a hospital or doctor's office? No  When did it last happen? over 50 years ago  If all above answers are NO, may proceed with cephalosporin use.   Review of Systems:   General: normal appetite, decreased energy, no weight gain, no weight loss, no fever  Cardiac: + chest pain with exertion, + chest pain at rest, + SOB with minimal exertion, no resting SOB, no PND, no orthopnea, no palpitations, no arrhythmia, no atrial fibrillation, + LE edema, no dizzy spells, no syncope    Respiratory: +  shortness of breath, no home oxygen, no productive cough, no dry cough, no bronchitis, no wheezing, no hemoptysis, no asthma, no pain with inspiration or cough, no sleep apnea, no CPAP at night  GI: no difficulty swallowing, + reflux, no frequent heartburn, no hiatal hernia, no abdominal pain, + constipation, no diarrhea, no hematochezia, no hematemesis, no melena  GU: no dysuria, no frequency, no urinary tract infection, no hematuria, no kidney stones, no kidney disease  Vascular: no pain suggestive of claudication, + pain in feet, no leg cramps, no varicose veins, no DVT, no non-healing foot ulcer  Neuro: no stroke, + TIA's, no seizures, no headaches, no temporary blindness one eye, no slurred speech, no peripheral neuropathy, no chronic pain, mild instability of gait, no memory/cognitive dysfunction  Musculoskeletal: + arthritis, no joint swelling, no myalgias, no difficulty walking, normal mobility  Skin: no rash, no itching, no skin infections, no pressure sores or ulcerations  Psych: no anxiety, no depression, no nervousness, no unusual recent stress  Eyes: no blurry vision, + floaters, no recent vision changes, + wears glasses or contacts  ENT: no hearing loss, no loose or painful teeth, no dentures, last saw dentist September 2020  Hematologic: + easy bruising, no abnormal bleeding, no clotting disorder, no frequent epistaxis  Endocrine: no diabetes, does not check CBG's at home    Physical Exam:   BP (!) 153/80 (BP Location: Right Arm)   Pulse 83   Temp 97.7 F (36.5 C) (Skin)   Resp 20   Ht _0  (1.549 m)   Wt 193 lb (87.5 kg)   SpO2 94% Comment: RA   BMI 36.47 kg/m  General: Moderately obese, well-appearing  HEENT: Unremarkable  Neck: no JVD, no bruits, no adenopathy  Chest: clear to auscultation, symmetrical breath sounds, no wheezes, no rhonchi  CV: RRR, grade III/VI crescendo/decrescendo murmur heard best at RSB, no diastolic murmur  Abdomen: soft, non-tender,  no masses  Extremities: warm, well-perfused, pulses not palpable, + bilateral mild LE edema  Rectal/GU Deferred  Neuro: Grossly non-focal and symmetrical throughout  Skin: Clean and dry, no rashes, no breakdown    Diagnostic Tests:   ECHOCARDIOGRAM REPORT  Patient Name: Victoria Burns Date of Exam: 05/06/2019  Medical Rec #: [id number] Height: 62.0 in  Accession #: [id number] Weight: 192.0 lb  Date of Birth: 11-22-1940 BSA: 1.88 m  Patient Age: 26 years BP: 128/62 mmHg  Patient Gender: F HR: 68 bpm.  Exam Location: [facility]  Procedure: 2D Echo, Cardiac Doppler and Color Doppler  Indications: I35.0 Nonrheumatic aortic (valve) stenosis  History: Patient has prior history of Echocardiogram examinations, most  recent 02/28/2018. CAD, PAD; Aortic Valve Disease and Mitral  Valve Disease Signs/Symptoms:Shortness of Breath Risk  Factors:Hypertension, Dyslipidemia and Former Smoker. Patient  became extremely winded with very little effort.  Sonographer: Pilar Jarvis RDMS, RVT, RDCS  Referring Phys: [place]  1. Left ventricular ejection fraction, by visual estimation, is 55 to 60%. The left ventricle has normal function. Normal left ventricular size. There is mildly increased left ventricular hypertrophy.  2. Left ventricular diastolic Doppler parameters are consistent with impaired relaxation pattern of LV diastolic filling.  3. Global right ventricle has normal systolic function.The right ventricular size is normal. No increase in right ventricular wall thickness.  4. Left atrial size was mildly dilated.  5. Right atrial size was normal.  6. The mitral valve is normal in structure. Moderate mitral valve regurgitation.  7. The tricuspid valve is  grossly normal. Tricuspid valve regurgitation is trivial.  8. The aortic valve is tricuspid Aortic valve regurgitation is moderate by color flow Doppler. Moderate to severe aortic valve stenosis.  9. Peak aortic valve  velocity has increased since 02/2018; peak velocity and valve area are both now in the severe stenosis range. Mean gradient and dimensionless index remain in the moderate range.  10. The pulmonic valve was not well visualized. Pulmonic valve regurgitation is not visualized by color flow Doppler.  11. Normal pulmonary artery systolic pressure.  12. The inferior vena cava is normal in size with greater than 50% respiratory variability, suggesting right atrial pressure of 3 mmHg.  13. The interatrial septum was not well visualized.  FINDINGS  Left Ventricle: Left ventricular ejection fraction, by visual estimation, is 55 to 60%. The left ventricle has normal function. No evidence of left ventricular regional wall motion abnormalities. There is mildly increased left ventricular hypertrophy.  Normal left ventricular size. Spectral Doppler shows Left ventricular diastolic Doppler parameters are consistent with impaired relaxation pattern of LV diastolic filling.  Right Ventricle: The right ventricular size is normal. No increase in right ventricular wall thickness. Global RV systolic function is has normal systolic function. The tricuspid regurgitant velocity is 2.77 m/s, and with an assumed right atrial pressure  of 3 mmHg, the estimated right ventricular systolic pressure is normal at 33.7 mmHg.  Left Atrium: Left atrial size was mildly dilated.  Right Atrium: Right atrial size was normal in size  Pericardium: There is no evidence of pericardial effusion.  Mitral Valve: The mitral valve is normal in structure. Moderate mitral valve regurgitation.  Tricuspid Valve: The tricuspid valve is grossly normal. Tricuspid valve regurgitation is trivial by color flow Doppler.  Aortic Valve: The aortic valve is tricuspid. Aortic valve regurgitation is moderate by color flow Doppler. Aortic regurgitation PHT measures 356 msec. Moderate to severe aortic stenosis is present. Aortic valve mean gradient measures 32.0 mmHg.  Aortic  valve peak gradient measures 69.2 mmHg. Aortic valve area, by VTI measures 0.83 cm. Peak aortic valve velocity has increased since 02/2018; peak velocity and valve area are both now in the severe stenosis range. Mean gradient and dimensionless index  remain in the moderate range.  Pulmonic Valve: The pulmonic valve was not well visualized. Pulmonic valve regurgitation is not visualized by color flow Doppler. No evidence of pulmonic stenosis.  Aorta: The aortic root is normal in size and structure.  Pulmonary Artery: The pulmonary artery is not well seen.  Venous: The inferior vena cava is normal in size with greater than 50% respiratory variability, suggesting right atrial pressure of 3 mmHg.  IAS/Shunts: The interatrial septum was not well visualized.  LEFT VENTRICLE  PLAX 2D  LVIDd: 5.00 cm Diastology  LVIDs: 3.50 cm LV e' lateral: 5.98 cm/s  LV PW: 1.10 cm LV E/e' lateral: 15.6  LV IVS: 1.00 cm LV e' medial: 6.31 cm/s  LVOT diam: 1.80 cm LV E/e' medial: 14.8  LV SV: 67 ml  LV SV Index: 33.82  LVOT Area: 2.54 cm  LV Volumes (MOD)  LV area d, A2C: 23.20 cm  LV area d, A4C: 29.30 cm  LV area s, A4C: 17.00 cm  LV major d, A2C: 7.22 cm  LV major d, A4C: 7.28 cm  LV major s, A4C: 6.39 cm  LV vol d, MOD A2C: 63.4 ml  LV vol d, MOD A4C: 97.7 ml  LV vol s, MOD A4C: 39.2 ml  LV SV MOD A4C: 97.7 ml  RIGHT VENTRICLE IVC  RV Basal diam: 2.60 cm IVC diam: 2.20 cm  RV S prime: 11.10 cm/s  TAPSE (M-mode): 2.5 cm  LEFT ATRIUM Index RIGHT ATRIUM Index  LA diam: 4.20 cm 2.24 cm/m RA Area: 13.80 cm  LA Vol (A2C): 43.1 ml 22.94 ml/m RA Volume: 32.40 ml 17.24 ml/m  LA Vol (A4C): 67.3 ml 35.82 ml/m  LA Biplane Vol: 55.4 ml 29.49 ml/m  AORTIC VALVE PULMONIC VALVE  AV Area (Vmax): 0.80 cm PV Vmax: 0.85 m/s  AV Area (Vmean): 0.85 cm PV Peak grad: 2.9 mmHg  AV Area (VTI): 0.83 cm  AV Vmax: 416.00 cm/s  AV Vmean: 259.000 cm/s  AV VTI: 1.030 m  AV Peak Grad: 69.2 mmHg  AV  Mean Grad: 32.0 mmHg  LVOT Vmax: 131.00 cm/s  LVOT Vmean: 86.900 cm/s  LVOT VTI: 0.335 m  LVOT/AV VTI ratio: 0.33  AI PHT: 356 msec  AORTA  Ao Root diam: 2.80 cm  MITRAL VALVE TRICUSPID VALVE  MV Area (PHT): 4.15 cm TR Peak grad: 30.7 mmHg  MV PHT: 53.07 msec TR Vmax: 277.00 cm/s  MV Decel Time: 183 msec  MV E velocity: 93.10 cm/s 103 cm/s SHUNTS  MV A velocity: 104.00 cm/s 70.3 cm/s Systemic VTI: 0.34 m  MV E/A ratio: 0.90 1.5 Systemic Diam: 1.80 cm  Nelva Bush MD  Electronically signed by Nelva Bush MD  Signature Date/Time: 05/06/2019/7:27:18 PM    RIGHT/LEFT HEART CATH AND CORONARY ANGIOGRAPHY  Conclusion  2nd Mrg-1 lesion is 70% stenosed.  2nd Mrg-2 lesion is 30% stenosed.  2nd Mrg-3 lesion is 60% stenosed.  Hemodynamic findings consistent with moderate pulmonary hypertension.  Surgeon Notes    05/23/2019 5:07 PM Op Note signed by Katha Cabal, MD  Indications  Shortness of breath [id number] (ICD-10-CM)]  Procedural Details  Technical Details Cardiac Catheterization Procedure Note  Name: MALYN AYTES MRN: [id number] DOB: September 12, 1940  Procedure: Left Heart Cath, Selective Coronary Angiography, LV angiography, right heart cath  Indication: Ms. Kunde is a 78 year old woman with past medical history of Hypothyroidism Hyperlipidemia Coronary artery disease, history of stenting x1 to OM Hypertension Chronic back pain PAD, bilateral carotid endarterectomy, 15 yr ago 1 year ago Former smoker Moderate to severe AS in 02/2018 Spinal surgery Prior sleep study AHI 13 events per hour and the RDI was 17 events per hour performed May 2016 Presenting for cardiac catheterization given history of coronary artery disease, prior stent to OM vessel , unstable angina symptoms aortic valve stenosis/preop eval  Procedural details: The right groin was prepped, draped, and anesthetized with 1% lidocaine. Using modified Seldinger technique, a 5 French sheath was introduced  into the right femoral artery. 7 French sheath introduced into right femoral vein for right heart pressures. standard Judkins catheters (JL 4, JR 4 and pigtail catheter) were used for coronary angiography and left ventriculography. Catheter exchanges were performed over a guidewire. There were no immediate procedural complications. The patient was transferred to the post catheterization recovery area for further monitoring.  Moderate sedation: 1. Sedation used:2 mg versed, 50 ug fentanyl IV 2. Time of administration: 3:30 PM Time patient left for recovery 5:15 pm, sedation time 1:45 hr 3. I was Face to Face with the patient during this time: (code: [phone])   Procedural Findings: Very challenging case given severe access issues, assisted by Dr. Barbera Setters Determined that SFA 100% ostial chronic occlusion on the right 80% ostial right common iliac artery Able to gain access through the right femoral artery above  the occlusion, used a KMP 65 catheter used to assist with crossing the common iliac lesion  --- Very challenging visualization of the RCA, multiple catheters tried 3 DRC able to engage the ostium   Coronary angiography:  Coronary dominance: Right or codominant  Left mainstem: Large vessel that bifurcates into the LAD and left circumflex, no significant disease noted  Left anterior descending (LAD): Moderate sized vessel that appears to terminate prior to the apex, diagonal branch 2 of small size, mild to moderate diffuse disease  Left circumflex (LCx): Large vessel with OM branch 2, severe OM disease proximal region estimated 70 to 80%, location appears at proximal stent edge, moderate mid OM disease estimated 60%  Right coronary artery (RCA): Right dominant vessel with PL and PDA, moderate diffuse disease  Left ventriculography: LV gram not performed given contrast load Very difficult time crossing aortic valve Valve was crossed with tremendous difficulty, unable to  torque the catheter well secondary to severe common iliac disease Catheter damped, and inability to manipulate catheter off the wall. Not able to obtain accurate aortic valve pressures on pullback  Right heart pressures RA mean of 3 RV 55/10/18 PA 52/17 mean 31 Wedge 25 Cardiac output 4.11 Cardiac index 2.23  Final Conclusions:  Severe disease of OM vessel, appears to be at proximal edge of stent Mild to moderate diffuse disease of RCA, LAD Severe aortic valve stenosis Severe PAD with 80% ostial right common iliac artery Occluded right SFA   Recommendations:  Will refer to TAVR clinic given severe aortic valve stenosis We will have them review cardiac catheterization films, disease and OM vessel Will need CT scan chest abdomen pelvis given severe PAD appears to be worse on the right common iliac and SFA. Left not well visualized but appears to be patent  -During the case had chest pain, following the case had chest pain Long discussion with her concerning her pain which appeared to be reproducible on palpation, possible muscle cramping Reports this pain has been going on for months comes and goes -She had markedly elevated pressures 180 up to 200 during the case Also noted to have high blood pressure during recent office visit She was started on clonidine 0.1 mg twice daily Reports that she took 2 doses and blood pressure was markedly low in the 90s, minimal symptoms -She did receive hydralazine IV 10x2 doses with drop in her pressures into the 29F systolic range Chest pain relieved with tramadol, heat pack, morphine  --For now we will recommend she take half a clonidine the morning half a clonidine in the p.m. Continue other outpatient medications and closely monitor blood pressure Take additional half clonidine for systolic pressure greater than 160   Timothy Gollan 05/23/2019, 6:23 PM  Estimated blood loss <50 mL.   During this procedure medications were administered to  achieve and maintain moderate conscious sedation while the patient's heart rate, blood pressure, and oxygen saturation were continuously monitored and I was present face-to-face 100% of this time.  Medications  (Filter: Administrations occurring from 05/23/19 1530 to 05/24/19 1530)  (important) Continuous medications are totaled by the amount administered until 05/24/19 1530.  Radiation/Fluoro  Fluoro time: 0, 41.2 (min)  DAP: 0, 91.2 (Gycm2)  Cumulative Air Kerma: 0, 1062 (mGy)  Coronary Findings  Diagnostic  Dominance: Right  Left Anterior Descending  There is moderate diffuse disease throughout the vessel.  Left Circumflex  Second Obtuse Marginal Branch  2nd Mrg-1 lesion 70% stenosed  2nd Mrg-1 lesion is 70% stenosed.  The lesion was previously treated.  2nd Mrg-2 lesion 30% stenosed  2nd Mrg-2 lesion is 30% stenosed. The lesion was previously treated.  2nd Mrg-3 lesion 60% stenosed  2nd Mrg-3 lesion is 60% stenosed.  Right Coronary Artery  There is moderate diffuse disease throughout the vessel.  Intervention  No interventions have been documented.  Right Heart  Right Heart Pressures Hemodynamic findings consistent with moderate pulmonary hypertension. Elevated LV EDP consistent with volume overload.  Coronary Diagrams  Diagnostic  Dominance: Right   CORONARY ATHERECTOMY  Conclusion  1st Mrg-2 lesion is 60% stenosed.  A drug-eluting stent was successfully placed using a STENT RESOLUTE ONYX 2.5X18.  Post intervention, there is a 0% residual stenosis.  1st Mrg-1 lesion is 80% stenosed.  A drug-eluting stent was successfully placed using a [place] [id number].  Post intervention, there is a 0% residual stenosis.  Ost Cx to Prox Cx lesion is 75% stenosed.  A drug-eluting stent was successfully placed using a STENT RESOLUTE ONYX 3.5X12.  Post intervention, there is a 0% residual stenosis. 1. Successful orbital atherectomy and stenting of the ostial LCx, proximal OM1  and distal OM1 with DES x3  Plan: DAPT for one year. Anticipate DC in am.   Recommendations  Antiplatelet/Anticoag Recommend uninterrupted dual antiplatelet therapy with Aspirin 65m daily and Clopidogrel 763mdaily for a minimum of 12 months (ACS-Class I recommendation).  Surgeon Notes    05/23/2019 5:07 PM Op Note signed by ScKatha CabalMD  Indications  Stable angina ([place][I20.8 (ICD-10-CM)]  Procedural Details  Technical Details Indication: [age] WF with Aortic stenosis and CAD. Unsuccessful PCI Of the LCx due to inability to cross lesion with the wire. Prior cardiac cath was unsuccessful from a right radial approach due to severe tortuosity and stenosis of the innominate artery. Right groin access very difficult due to severe iliac stenosis. PCI yesterday done via the left groin approach and she has extensive bruising at this site. For this reason left radial approach used.   Procedural Details: The left wrist was prepped, draped, and anesthetized with 1% lidocaine. Using the modified Seldinger technique, a 6 Fr slender sheath was introduced into the radial artery. 3 mg verapamil was administered through the radial sheath. Weight-based heparin was given for anticoagulation. Once a therapeutic ACT was achieved, a 6 Fr[place]BU 3.5 guide catheter was inserted. A prowater coronary guidewire was used to cross the lesions in the LCx and OM. Using a teleport catheter the prowater was exchanged for a Viper wire. We then performed orbital atherectomy of the ostial and mid LCx using a Diamondback catheter at 80K rpm for 5 passes in the mid vessel and 3 passes at the ostium. The lesions were predilated with a 2.5 mm balloon. We then exchanged the Viper wire for a prowater. The lesion distally was then stented with a 2.5 x 18 mm Resolute Onyx stent for in stent restenosis. The mid vessel was stented with a 2.75x12 mm Resolute Onyx stent. These were post dilated with a 3.0 mm [place] balloon. The ostial lesion  was then stented with a 3.5 x 12 mm Resolute Onyx stent. The stent was postdilated with a 3.75 mm noncompliant balloon. Following PCI, there was 0% residual stenosis and TIMI-3 flow. Final angiography confirmed an excellent result. The patient tolerated the procedure well. There were no immediate procedural complications. A TR band was used for radial hemostasis. The patient was transferred to the post catheterization recovery area for further monitoring.  Contrast:  125 cc    Estimated blood loss <50 mL.   During this procedure medications were administered to achieve and maintain moderate conscious sedation while the patient's heart rate, blood pressure, and oxygen saturation were continuously monitored and I was present face-to-face 100% of this time.  Contrast  Medication Name Total Dose  iohexol (OMNIPAQUE) 350 MG/ML injection 125 mL  Radiation/Fluoro  Fluoro time: 25.3 (min)  DAP: 42.4 (Gycm2)  Cumulative Air Kerma: 102.5 (mGy)  Complications  Complications documented before study signed (06/06/2019 8:52 PM)   No complications were associated with this study.  Documented by [place], Peter M, MD - 06/06/2019 2:43 PM  Coronary Findings  Diagnostic  Dominance: Right  Left Circumflex  Vessel is large.  Ost Cx to Prox Cx lesion 75% stenosed  Ost Cx to Prox Cx lesion is 75% stenosed.  First Obtuse Marginal Branch  Vessel is large in size.  1st Mrg-1 lesion 80% stenosed  1st Mrg-1 lesion is 80% stenosed. The lesion is calcified.  1st Mrg-2 lesion 60% stenosed  1st Mrg-2 lesion is 60% stenosed. The lesion was previously treated using a stent (unknown type) at an unknown date.  Second Obtuse Marginal Branch  Vessel is small in size.  Intervention  Ost Cx to Prox Cx lesion  Stent  CATHETER LAUNCHER 6FR EBU3.5 guide catheter was inserted. Lesion crossed with guidewire using a WIRE ASAHI PROWATER 180CM. Pre-stent angioplasty was performed using a BALLOON SAPPHIRE 2.5X12. A drug-eluting  stent was successfully placed using a STENT RESOLUTE ONYX 3.5X12. Stent strut is well apposed. Post-stent angioplasty was performed using a BALLOON SAPPHIRE [place] 3.75X8. Maximum pressure: 16 atm.  Atherectomy  CATHETER LAUNCHER 6FR EBU3.5 guide catheter was inserted. WIRE VIPERWIRE COR FLEX TIP guidewire was used to cross lesion. Orbital atherectomy was performed using a CROWN DIAMONDBACK CLASSIC 1.25. 3 passes taken.  Post-Intervention Lesion Assessment  The intervention was successful. Pre-interventional TIMI flow is 3. Post-intervention TIMI flow is 3. No complications occurred at this lesion.  There is a 0% residual stenosis post intervention.  1st Mrg-1 lesion  Stent  CATHETER LAUNCHER 6FR EBU3.5 guide catheter was inserted. Lesion crossed with guidewire using a WIRE ASAHI PROWATER 180CM. Pre-stent angioplasty was performed using a BALLOON SAPPHIRE 2.5X12. A drug-eluting stent was successfully placed using a [place] [id number]. Stent strut is well apposed. Post-stent angioplasty was performed using a BALLOON SAPPHIRE [place] 3.0X8. Maximum pressure: 18 atm.  Atherectomy  CATHETER LAUNCHER 6FR EBU3.5 guide catheter was inserted. WIRE VIPERWIRE COR FLEX TIP guidewire was used to cross lesion. Orbital atherectomy was performed using a CROWN DIAMONDBACK CLASSIC 1.25.  Post-Intervention Lesion Assessment  The intervention was successful. Pre-interventional TIMI flow is 3. Post-intervention TIMI flow is 3. No complications occurred at this lesion.  There is a 0% residual stenosis post intervention.  1st Mrg-2 lesion  Stent  CATHETER LAUNCHER 6FR EBU3.5 guide catheter was inserted. Lesion crossed with guidewire using a WIRE ASAHI PROWATER 180CM. Pre-stent angioplasty was performed using a BALLOON SAPPHIRE 2.5X12. A drug-eluting stent was successfully placed using a STENT RESOLUTE ONYX 2.5X18. Stent strut is well apposed. Post-stent angioplasty was performed using a BALLOON SAPPHIRE  3.0X8. Maximum  pressure: 16 atm.  Post-Intervention Lesion Assessment  The intervention was successful. Pre-interventional TIMI flow is 3. Post-intervention TIMI flow is 3. No complications occurred at this lesion.  There is a 0% residual stenosis post intervention.  Coronary Diagrams  Diagnostic  Dominance: Right   Intervention     CT ANGIOGRAPHY CHEST, ABDOMEN  AND PELVIS  TECHNIQUE:  Multidetector CT imaging through the chest, abdomen and pelvis was  performed using the standard protocol during bolus administration of  intravenous contrast. Multiplanar reconstructed images and MIPs were  obtained and reviewed to evaluate the vascular anatomy.  CONTRAST: 179m OMNIPAQUE IOHEXOL 350 MG/ML SOLN  COMPARISON: No priors.  FINDINGS:  CTA CHEST FINDINGS  Cardiovascular: Heart size is borderline enlarged. There is no  significant pericardial fluid, thickening or pericardial  calcification. There is aortic atherosclerosis, as well as  atherosclerosis of the great vessels of the mediastinum and the  coronary arteries, including calcified atherosclerotic plaque in the  left main, left anterior descending, left circumflex and right  coronary arteries. Thickening calcification of the aortic valve.  Mediastinum/Lymph Nodes: No pathologically enlarged mediastinal or  hilar lymph nodes. Esophagus is unremarkable in appearance. No  axillary lymphadenopathy.  Lungs/Pleura: Patchy areas of mild scarring in the medial aspects of  the lower lobes of the lungs bilaterally. No acute consolidative  airspace disease. No pleural effusions. No suspicious appearing  pulmonary nodules or masses are noted.  Musculoskeletal/Soft Tissues: There are no aggressive appearing  lytic or blastic lesions noted in the visualized portions of the  skeleton.  CTA ABDOMEN AND PELVIS FINDINGS  Hepatobiliary: Liver has a shrunken appearance and nodular contour,  indicative of underlying cirrhosis. No suspicious cystic or solid    hepatic lesions. No significant intra or extrahepatic biliary ductal  dilatation. Status post cholecystectomy.  Pancreas: No pancreatic mass. No pancreatic ductal dilatation. No  pancreatic or peripancreatic fluid collections or inflammatory  changes.  Spleen: Unremarkable.  Adrenals/Urinary Tract: Low-attenuation lesions in both kidneys,  compatible with simple cysts, largest of which measures 2.5 cm in  the lower pole the right kidney. Other subcentimeter low-attenuation  lesions in both kidneys, too small to characterize, but  statistically likely to represent tiny cysts. Bilateral adrenal  glands are normal in appearance. No hydroureteronephrosis. Urinary  bladder is normal in appearance.  Stomach/Bowel: Normal appearance of the stomach. No pathologic  dilatation of small bowel or colon. Numerous colonic diverticulae  are noted, without surrounding inflammatory changes to suggest an  acute diverticulitis at this time The appendix is not confidently  identified and may be surgically absent. Regardless, there are no  inflammatory changes noted adjacent to the cecum to suggest the  presence of an acute appendicitis at this time.  Vascular/Lymphatic: Aortic atherosclerosis, without evidence of  aneurysm or dissection in the abdominal or pelvic vasculature.  Vascular findings and measurements pertinent to potential TAVR  procedure, as detailed below. Severe stenosis of the distal  infrarenal abdominal aorta immediately before the level of the  bifurcation (9 x 5 mm). Complete occlusion of the right superficial  femoral artery at the ostium. Distal reconstitution of flow in the  right superficial femoral artery, presumably from collateral  vessels. No lymphadenopathy noted in the abdomen or pelvis.  Reproductive: Uterus and ovaries are atrophic but otherwise  unremarkable in appearance.  Other: No significant volume of ascites. No pneumoperitoneum.  Musculoskeletal: Status post PLIF  at L4-L5 with interbody cages at  the L4-L5 interspace. There are no aggressive appearing lytic or  blastic lesions noted in the visualized portions of the skeleton.  VASCULAR MEASUREMENTS PERTINENT TO TAVR:  AORTA:  Minimal Aortic Diameter-9 x 5 mm  Severity of Aortic Calcification-severe  RIGHT PELVIS:  Right Common Iliac Artery -  Minimal Diameter-3.4 x 3.5 mm  Tortuosity - mild  Calcification-severe  Right External Iliac Artery -  Minimal Diameter-4.3 x 6.1 mm  Tortuosity - mild  Calcification-mild  Right Common Femoral Artery -  Minimal Diameter-5.7 x 4.0 mm  Tortuosity - mild  Calcification-moderate  LEFT PELVIS:  Left Common Iliac Artery -  Minimal Diameter-5.5 x 5.8 mm  Tortuosity - mild  Calcification-severe  Left External Iliac Artery -  Minimal Diameter-5.9 x 4.6 mm  Tortuosity - mild  Calcification-mild  Left Common Femoral Artery -  Minimal Diameter-5.4 x 3.6 mm  Tortuosity-mild  Calcification-moderate  Review of the MIP images confirms the above findings.  IMPRESSION:  1. Vascular findings and measurements pertinent to potential TAVR  procedure, as detailed above.  2. Severe thickening calcification of the aortic valve, compatible  with the reported clinical history of severe aortic stenosis.  3. Aortic atherosclerosis, in addition to left main and 3 vessel  coronary artery disease.  4. Morphologic changes in the liver suggestive of cirrhosis.  5. Colonic diverticulosis without evidence of acute diverticulitis  at this time.  6. Additional incidental findings, as above.  Electronically Signed  By: Vinnie Langton M.D.  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 She describes a long history of worsening symptoms of exertional shortness of breath and fatigue consistent with chronic diastolic congestive heart failure, New York Heart Association functional class  III. She is also having frequent episodes of substernal chest pain relieved with nitroglycerin consistent with angina pectoris.   I have personally reviewed the patient's recent transthoracic echocardiogram, diagnostic cardiac catheterization and subsequent PCI studies, and CT angiograms performed earlier today. Transthoracic echocardiogram demonstrates normal left ventricular systolic function with severe aortic stenosis and moderate aortic insufficiency. 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 Severity of aortic stenosis is exacerbated by the presence of relatively small diameter aortic root. The aortic valve is trileaflet with moderate to severe thickening, calcification, and restricted leaflet mobility involving all 3 leaflets. There is significant aortic insufficiency. Peak velocity across aortic valve measured greater than 4.1 m/s corresponding to mean transvalvular gradient estimated 32 mmHg and aortic valve area 0.8 cm. The DVI was reported 0.33 which is not surprising given the relatively small size of the aortic root and the presence of significant aortic insufficiency. Aortic insufficiency pressure half-time measured 356 ms. There appears to be mild to moderate mitral regurgitation. Diagnostic cardiac catheterization reveals multivessel coronary artery disease with continued patency of stents in multiple locations but significant ostial stenosis of left circumflex coronary artery as well as stenosis of proximal segment of the stent in the obtuse marginal branch, both of which were treated last week with atherectomy and PCI with stenting. Catheterization was also notable for the presence of moderate pulmonary hypertension.   I agree the patient would benefit from aortic valve replacement. Risks associated with conventional surgery would be somewhat elevated because of the patient's advanced age, somewhat  limited mobility, and significant calcification in the ascending thoracic aorta. Moreover, the patient has very small size aortic root and would likely require aortic root replacement to avoid the development of patient prosthesis mismatch. Cardiac-gated CTA of the heart reveals anatomical characteristics consistent with aortic stenosis suitable for treatment by transcatheter aortic valve replacement without any significant complicating features other than the fact that the patient's aortic root is quite small. CTA of the aorta and iliac vessels confirms the presence of significant peripheral arterial disease with stenosis  of the distal aorta and both common iliac arteries. Although the common iliac arteries are patent she may not have adequate pelvic vascular access to facilitate transfemoral approach. Left subclavian artery approach appears feasible.  I would be reluctant to consider transcarotid approach given the patient's history of previous bilateral carotid endarterectomies. 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   Plan:   The patient and her family were counseled at length regarding treatment alternatives for management of severe symptomatic aortic stenosis. Alternative approaches such as conventional aortic valve replacement, transcatheter aortic valve replacement, and continued medical therapy without intervention were compared and contrasted at length. The risks associated with conventional surgical aortic valve replacement were discussed in detail, as were expectations for post-operative convalescence, and why I would be reluctant to consider this patient a candidate for conventional surgery. Issues specific to transcatheter aortic valve replacement were discussed including questions about long term valve durability,  the potential for paravalvular leak, possible increased risk of need for permanent pacemaker placement, and other technical complications related to the procedure itself. Long-term prognosis with medical therapy was discussed. This discussion was placed in the context of the patient's own specific clinical presentation and past medical history. All of their questions have been addressed.   Following the decision to proceed with transcatheter aortic valve replacement, a discussion has been held regarding what types of management strategies would be attempted intraoperatively in the event of life-threatening complications, including whether or not the patient would be considered a candidate for the use of cardiopulmonary bypass and/or conversion to open sternotomy for attempted surgical intervention. The patient specifically requests that should a potentially life-threatening complication develop we would attempt emergency median sternotomy and/or other aggressive surgical procedures. The patient has been advised of a variety of complications that might develop including but not limited to risks of death, stroke, paravalvular leak, aortic dissection or other major vascular complications, aortic annulus rupture, device embolization, cardiac rupture or perforation, mitral regurgitation, acute myocardial infarction, arrhythmia, heart block or bradycardia requiring permanent pacemaker placement, congestive heart failure, respiratory failure, renal failure, pneumonia, infection, other late complications related to structural valve deterioration or migration, or other complications that might ultimately cause a temporary or permanent loss of functional independence or other long term morbidity. The patient provides full informed consent for the procedure as described and all questions were answered.   Plan:  Transcatheter aortic valve replacement using a 23 mm Medtronic Evolut Pro+ via the left subclavian artery.

## 2019-06-24 ENCOUNTER — Inpatient Hospital Stay (HOSPITAL_COMMUNITY): Payer: Medicare Other | Admitting: Certified Registered Nurse Anesthetist

## 2019-06-24 ENCOUNTER — Inpatient Hospital Stay (HOSPITAL_COMMUNITY): Payer: Medicare Other

## 2019-06-24 ENCOUNTER — Encounter (HOSPITAL_COMMUNITY): Payer: Self-pay | Admitting: Certified Registered Nurse Anesthetist

## 2019-06-24 ENCOUNTER — Other Ambulatory Visit: Payer: Self-pay

## 2019-06-24 ENCOUNTER — Encounter (HOSPITAL_COMMUNITY)
Admission: RE | Disposition: A | Discharge status: Home / Self Care | Payer: Self-pay | Source: Home / Self Care | Attending: Surgery

## 2019-06-24 ENCOUNTER — Inpatient Hospital Stay (HOSPITAL_COMMUNITY)
Admission: RE | Admit: 2019-06-24 | Discharge: 2019-06-27 | DRG: 267 | Disposition: A | Discharge status: Home / Self Care | Payer: Medicare Other | Attending: Surgery | Admitting: Surgery

## 2019-06-24 ENCOUNTER — Other Ambulatory Visit: Payer: Self-pay | Admitting: Physician Assistant

## 2019-06-24 ENCOUNTER — Inpatient Hospital Stay (HOSPITAL_COMMUNITY): Payer: Medicare Other | Admitting: Physician Assistant

## 2019-06-24 DIAGNOSIS — Z825 Family history of asthma and other chronic lower respiratory diseases: Secondary | ICD-10-CM

## 2019-06-24 DIAGNOSIS — I6529 Occlusion and stenosis of unspecified carotid artery: Secondary | ICD-10-CM | POA: Diagnosis present

## 2019-06-24 DIAGNOSIS — I062 Rheumatic aortic stenosis with insufficiency: Principal | ICD-10-CM | POA: Diagnosis present

## 2019-06-24 DIAGNOSIS — I350 Nonrheumatic aortic (valve) stenosis: Secondary | ICD-10-CM | POA: Diagnosis not present

## 2019-06-24 DIAGNOSIS — Z880 Allergy status to penicillin: Secondary | ICD-10-CM

## 2019-06-24 DIAGNOSIS — Z87891 Personal history of nicotine dependence: Secondary | ICD-10-CM

## 2019-06-24 DIAGNOSIS — Z885 Allergy status to narcotic agent status: Secondary | ICD-10-CM

## 2019-06-24 DIAGNOSIS — I739 Peripheral vascular disease, unspecified: Secondary | ICD-10-CM | POA: Diagnosis present

## 2019-06-24 DIAGNOSIS — I442 Atrioventricular block, complete: Secondary | ICD-10-CM | POA: Diagnosis present

## 2019-06-24 DIAGNOSIS — E039 Hypothyroidism, unspecified: Secondary | ICD-10-CM | POA: Diagnosis not present

## 2019-06-24 DIAGNOSIS — Z79899 Other long term (current) drug therapy: Secondary | ICD-10-CM

## 2019-06-24 DIAGNOSIS — I25119 Atherosclerotic heart disease of native coronary artery with unspecified angina pectoris: Secondary | ICD-10-CM | POA: Diagnosis not present

## 2019-06-24 DIAGNOSIS — I70201 Unspecified atherosclerosis of native arteries of extremities, right leg: Secondary | ICD-10-CM | POA: Diagnosis not present

## 2019-06-24 DIAGNOSIS — I10 Essential (primary) hypertension: Secondary | ICD-10-CM | POA: Diagnosis not present

## 2019-06-24 DIAGNOSIS — Z8673 Personal history of transient ischemic attack (TIA), and cerebral infarction without residual deficits: Secondary | ICD-10-CM

## 2019-06-24 DIAGNOSIS — Z006 Encounter for examination for normal comparison and control in clinical research program: Secondary | ICD-10-CM | POA: Diagnosis not present

## 2019-06-24 DIAGNOSIS — G4730 Sleep apnea, unspecified: Secondary | ICD-10-CM | POA: Diagnosis present

## 2019-06-24 DIAGNOSIS — Z952 Presence of prosthetic heart valve: Secondary | ICD-10-CM

## 2019-06-24 DIAGNOSIS — I4510 Unspecified right bundle-branch block: Secondary | ICD-10-CM | POA: Diagnosis present

## 2019-06-24 DIAGNOSIS — Z954 Presence of other heart-valve replacement: Secondary | ICD-10-CM | POA: Diagnosis not present

## 2019-06-24 DIAGNOSIS — I2510 Atherosclerotic heart disease of native coronary artery without angina pectoris: Secondary | ICD-10-CM | POA: Diagnosis present

## 2019-06-24 DIAGNOSIS — I441 Atrioventricular block, second degree: Secondary | ICD-10-CM

## 2019-06-24 DIAGNOSIS — Z955 Presence of coronary angioplasty implant and graft: Secondary | ICD-10-CM

## 2019-06-24 DIAGNOSIS — E782 Mixed hyperlipidemia: Secondary | ICD-10-CM | POA: Diagnosis not present

## 2019-06-24 DIAGNOSIS — Z8249 Family history of ischemic heart disease and other diseases of the circulatory system: Secondary | ICD-10-CM

## 2019-06-24 DIAGNOSIS — E785 Hyperlipidemia, unspecified: Secondary | ICD-10-CM | POA: Diagnosis present

## 2019-06-24 DIAGNOSIS — G459 Transient cerebral ischemic attack, unspecified: Secondary | ICD-10-CM | POA: Diagnosis present

## 2019-06-24 HISTORY — PX: TEE WITHOUT CARDIOVERSION: SHX5443

## 2019-06-24 HISTORY — DX: Peripheral vascular disease, unspecified: I73.9

## 2019-06-24 HISTORY — DX: Presence of prosthetic heart valve: Z95.2

## 2019-06-24 HISTORY — DX: Disorder of arteries and arterioles, unspecified: I77.9

## 2019-06-24 HISTORY — DX: Nonrheumatic aortic (valve) stenosis: I35.0

## 2019-06-24 LAB — POCT I-STAT, CHEM 8
BUN: 16 mg/dL (ref 8–23)
Calcium, Ion: 1.19 mmol/L (ref 1.15–1.40)
Chloride: 107 mmol/L (ref 98–111)
Creatinine, Ser: 0.5 mg/dL (ref 0.44–1.00)
Glucose, Bld: 100 mg/dL — ABNORMAL HIGH (ref 70–99)
HCT: 35 % — ABNORMAL LOW (ref 36.0–46.0)
Hemoglobin: 11.9 g/dL — ABNORMAL LOW (ref 12.0–15.0)
Potassium: 3.9 mmol/L (ref 3.5–5.1)
Sodium: 141 mmol/L (ref 135–145)
TCO2: 24 mmol/L (ref 22–32)

## 2019-06-24 SURGERY — IMPLANTATION, AORTIC VALVE, TRANSCATHETER, SUBCLAVIAN ARTERY APPROACH
Anesthesia: General | Site: Chest

## 2019-06-24 MED ORDER — HYDRALAZINE HCL 50 MG PO TABS: 50.0000 mg | ORAL_TABLET | Freq: Four times a day (QID) | ORAL | Status: DC | PRN

## 2019-06-24 MED ORDER — ONDANSETRON HCL 4 MG/2ML IJ SOLN
INTRAMUSCULAR | Status: DC | PRN
  Administered 2019-06-24: 4 mg via INTRAVENOUS

## 2019-06-24 MED ORDER — LEVOFLOXACIN IN D5W 750 MG/150ML IV SOLN
750.0000 mg | INTRAVENOUS | Status: AC
  Administered 2019-06-25: 750 mg via INTRAVENOUS
  Filled 2019-06-24: qty 150

## 2019-06-24 MED ORDER — CHLORHEXIDINE GLUCONATE 0.12 % MT SOLN
OROMUCOSAL | Status: AC
  Administered 2019-06-24: 15 mL
  Filled 2019-06-24: qty 15

## 2019-06-24 MED ORDER — TRAMADOL HCL 50 MG PO TABS
50.0000 mg | ORAL_TABLET | ORAL | Status: DC | PRN
  Administered 2019-06-25 – 2019-06-26 (×2): 50 mg via ORAL
  Filled 2019-06-24 (×2): qty 1

## 2019-06-24 MED ORDER — CHLORHEXIDINE GLUCONATE 4 % EX LIQD: 60.0000 mL | Freq: Once | CUTANEOUS | Status: DC

## 2019-06-24 MED ORDER — ONDANSETRON HCL 4 MG/2ML IJ SOLN
4.0000 mg | Freq: Four times a day (QID) | INTRAMUSCULAR | Status: DC | PRN
  Administered 2019-06-24 – 2019-06-26 (×4): 4 mg via INTRAVENOUS
  Filled 2019-06-24 (×5): qty 2

## 2019-06-24 MED ORDER — ONDANSETRON HCL 4 MG/2ML IJ SOLN
INTRAMUSCULAR | Status: AC
  Filled 2019-06-24: qty 2

## 2019-06-24 MED ORDER — 0.9 % SODIUM CHLORIDE (POUR BTL) OPTIME
TOPICAL | Status: DC | PRN
  Administered 2019-06-24: 3000 mL

## 2019-06-24 MED ORDER — PHENYLEPHRINE HCL-NACL 20-0.9 MG/250ML-% IV SOLN: 0.0000 ug/min | INTRAVENOUS | Status: DC

## 2019-06-24 MED ORDER — SODIUM CHLORIDE 0.9% FLUSH: 3.0000 mL | INTRAVENOUS | Status: DC | PRN

## 2019-06-24 MED ORDER — FENTANYL CITRATE (PF) 250 MCG/5ML IJ SOLN
INTRAMUSCULAR | Status: AC
  Filled 2019-06-24: qty 5

## 2019-06-24 MED ORDER — CHLORHEXIDINE GLUCONATE CLOTH 2 % EX PADS: 6.0000 | MEDICATED_PAD | Freq: Every day | CUTANEOUS | Status: DC

## 2019-06-24 MED ORDER — OXYCODONE HCL 5 MG PO TABS: 5.0000 mg | ORAL_TABLET | ORAL | Status: DC | PRN

## 2019-06-24 MED ORDER — SODIUM CHLORIDE 0.9 % IV SOLN
INTRAVENOUS | Status: AC
  Filled 2019-06-24 (×3): qty 1.2

## 2019-06-24 MED ORDER — MORPHINE SULFATE (PF) 2 MG/ML IV SOLN: 1.0000 mg | INTRAVENOUS | Status: DC | PRN

## 2019-06-24 MED ORDER — PROTAMINE SULFATE 10 MG/ML IV SOLN
INTRAVENOUS | Status: AC
  Filled 2019-06-24: qty 25

## 2019-06-24 MED ORDER — SODIUM CHLORIDE 0.9 % IV SOLN
INTRAVENOUS | Status: AC
  Administered 2019-06-24: 16:00:00 via INTRAVENOUS

## 2019-06-24 MED ORDER — LACTATED RINGERS IV SOLN
INTRAVENOUS | Status: DC
  Administered 2019-06-24: 11:00:00 via INTRAVENOUS

## 2019-06-24 MED ORDER — SODIUM CHLORIDE 0.9 % IV SOLN
INTRAVENOUS | Status: DC | PRN
  Administered 2019-06-24: 500 mL

## 2019-06-24 MED ORDER — SODIUM CHLORIDE 0.9 % IV SOLN: 250.0000 mL | INTRAVENOUS | Status: DC | PRN

## 2019-06-24 MED ORDER — LEVOTHYROXINE SODIUM 25 MCG PO TABS
25.0000 ug | ORAL_TABLET | Freq: Every day | ORAL | Status: DC
  Administered 2019-06-25 – 2019-06-27 (×3): 25 ug via ORAL
  Filled 2019-06-24 (×3): qty 1

## 2019-06-24 MED ORDER — PROPOFOL 10 MG/ML IV BOLUS
INTRAVENOUS | Status: DC | PRN
  Administered 2019-06-24: 140 mg via INTRAVENOUS
  Administered 2019-06-24: 30 mg via INTRAVENOUS

## 2019-06-24 MED ORDER — HEPARIN SODIUM (PORCINE) 1000 UNIT/ML IJ SOLN
INTRAMUSCULAR | Status: AC
  Filled 2019-06-24: qty 1

## 2019-06-24 MED ORDER — CHLORHEXIDINE GLUCONATE 4 % EX LIQD: 30.0000 mL | CUTANEOUS | Status: DC

## 2019-06-24 MED ORDER — IODIXANOL 320 MG/ML IV SOLN
INTRAVENOUS | Status: DC | PRN
  Administered 2019-06-24: 81 mL via INTRA_ARTERIAL

## 2019-06-24 MED ORDER — ISOSORBIDE MONONITRATE ER 30 MG PO TB24
30.0000 mg | ORAL_TABLET | Freq: Two times a day (BID) | ORAL | Status: DC
  Administered 2019-06-24 – 2019-06-27 (×6): 30 mg via ORAL
  Filled 2019-06-24 (×6): qty 1

## 2019-06-24 MED ORDER — LIDOCAINE 2% (20 MG/ML) 5 ML SYRINGE
INTRAMUSCULAR | Status: DC | PRN
  Administered 2019-06-24: 100 mg via INTRAVENOUS

## 2019-06-24 MED ORDER — CLOPIDOGREL BISULFATE 75 MG PO TABS
75.0000 mg | ORAL_TABLET | Freq: Every day | ORAL | Status: DC
  Administered 2019-06-24 – 2019-06-27 (×4): 75 mg via ORAL
  Filled 2019-06-24 (×4): qty 1

## 2019-06-24 MED ORDER — PROTAMINE SULFATE 10 MG/ML IV SOLN
INTRAVENOUS | Status: DC | PRN
  Administered 2019-06-24: 130 mg via INTRAVENOUS

## 2019-06-24 MED ORDER — PHENYLEPHRINE 40 MCG/ML (10ML) SYRINGE FOR IV PUSH (FOR BLOOD PRESSURE SUPPORT)
PREFILLED_SYRINGE | INTRAVENOUS | Status: AC
  Filled 2019-06-24: qty 10

## 2019-06-24 MED ORDER — ATORVASTATIN CALCIUM 80 MG PO TABS
80.0000 mg | ORAL_TABLET | Freq: Every day | ORAL | Status: DC
  Administered 2019-06-24 – 2019-06-26 (×2): 80 mg via ORAL
  Filled 2019-06-24 (×2): qty 1

## 2019-06-24 MED ORDER — LACTATED RINGERS IV SOLN
INTRAVENOUS | Status: DC | PRN
  Administered 2019-06-24: 09:00:00 via INTRAVENOUS

## 2019-06-24 MED ORDER — ACETAMINOPHEN 325 MG PO TABS: 650.0000 mg | ORAL_TABLET | Freq: Four times a day (QID) | ORAL | Status: DC | PRN

## 2019-06-24 MED ORDER — SODIUM CHLORIDE 0.9 % IV SOLN: INTRAVENOUS | Status: DC

## 2019-06-24 MED ORDER — ROCURONIUM BROMIDE 100 MG/10ML IV SOLN
INTRAVENOUS | Status: DC | PRN
  Administered 2019-06-24: 80 mg via INTRAVENOUS

## 2019-06-24 MED ORDER — FENTANYL CITRATE (PF) 100 MCG/2ML IJ SOLN
INTRAMUSCULAR | Status: DC | PRN
  Administered 2019-06-24: 50 ug via INTRAVENOUS
  Administered 2019-06-24: 150 ug via INTRAVENOUS
  Administered 2019-06-24: 25 ug via INTRAVENOUS
  Administered 2019-06-24 (×2): 50 ug via INTRAVENOUS

## 2019-06-24 MED ORDER — VANCOMYCIN HCL IN DEXTROSE 1-5 GM/200ML-% IV SOLN
1000.0000 mg | Freq: Once | INTRAVENOUS | Status: AC
  Administered 2019-06-24: 1000 mg via INTRAVENOUS
  Filled 2019-06-24: qty 200

## 2019-06-24 MED ORDER — PROMETHAZINE HCL 25 MG/ML IJ SOLN
6.2500 mg | Freq: Four times a day (QID) | INTRAMUSCULAR | Status: DC | PRN
  Administered 2019-06-24 – 2019-06-26 (×2): 12.5 mg via INTRAVENOUS
  Filled 2019-06-24 (×2): qty 1

## 2019-06-24 MED ORDER — NITROGLYCERIN IN D5W 200-5 MCG/ML-% IV SOLN
0.0000 ug/min | INTRAVENOUS | Status: AC
  Administered 2019-06-24: 15 ug/min via INTRAVENOUS
  Administered 2019-06-24: 25 ug/min via INTRAVENOUS

## 2019-06-24 MED ORDER — LIDOCAINE HCL (PF) 1 % IJ SOLN
INTRAMUSCULAR | Status: AC
  Filled 2019-06-24: qty 30

## 2019-06-24 MED ORDER — HEPARIN SODIUM (PORCINE) 1000 UNIT/ML IJ SOLN
INTRAMUSCULAR | Status: DC | PRN
  Administered 2019-06-24: 13000 [IU] via INTRAVENOUS

## 2019-06-24 MED ORDER — SODIUM CHLORIDE 0.9% FLUSH
3.0000 mL | Freq: Two times a day (BID) | INTRAVENOUS | Status: DC
  Administered 2019-06-25 – 2019-06-26 (×3): 3 mL via INTRAVENOUS

## 2019-06-24 MED ORDER — ASPIRIN 81 MG PO CHEW
81.0000 mg | CHEWABLE_TABLET | Freq: Every day | ORAL | Status: DC
  Administered 2019-06-24 – 2019-06-27 (×4): 81 mg via ORAL
  Filled 2019-06-24 (×4): qty 1

## 2019-06-24 MED ORDER — PHENYLEPHRINE 40 MCG/ML (10ML) SYRINGE FOR IV PUSH (FOR BLOOD PRESSURE SUPPORT)
PREFILLED_SYRINGE | INTRAVENOUS | Status: DC | PRN
  Administered 2019-06-24: 80 ug via INTRAVENOUS

## 2019-06-24 MED ORDER — SUGAMMADEX SODIUM 200 MG/2ML IV SOLN
INTRAVENOUS | Status: DC | PRN
  Administered 2019-06-24: 200 mg via INTRAVENOUS

## 2019-06-24 MED ORDER — CHLORHEXIDINE GLUCONATE 0.12 % MT SOLN
15.0000 mL | Freq: Once | OROMUCOSAL | Status: AC
  Administered 2019-06-24 – 2019-06-25 (×2): 15 mL via OROMUCOSAL
  Filled 2019-06-24: qty 15

## 2019-06-24 MED ORDER — ACETAMINOPHEN 650 MG RE SUPP: 650.0000 mg | Freq: Four times a day (QID) | RECTAL | Status: DC | PRN

## 2019-06-24 MED ORDER — NITROGLYCERIN IN D5W 200-5 MCG/ML-% IV SOLN
INTRAVENOUS | Status: AC
  Filled 2019-06-24: qty 250

## 2019-06-24 SURGICAL SUPPLY — 90 items
BAG DECANTER FOR FLEXI CONT (MISCELLANEOUS) ×3 IMPLANT
BAG SNAP BAND KOVER 36X36 (MISCELLANEOUS) ×3 IMPLANT
BIOPATCH RED 1 DISK 7.0 (GAUZE/BANDAGES/DRESSINGS) ×6 IMPLANT
BLADE 11 SAFETY STRL DISP (BLADE) ×3 IMPLANT
BLADE CLIPPER SURG (BLADE) ×3 IMPLANT
BLADE OSCILLATING /SAGITTAL (BLADE) IMPLANT
BLADE STERNUM SYSTEM 6 (BLADE) IMPLANT
CABLE ADAPT CONN TEMP 6FT (ADAPTER) ×3 IMPLANT
CANNULA FEM VENOUS REMOTE 22FR (CANNULA) IMPLANT
CANNULA OPTISITE PERFUSION 16F (CANNULA) IMPLANT
CANNULA OPTISITE PERFUSION 18F (CANNULA) IMPLANT
CATH DIAG EXPO 6F AL1 (CATHETERS) IMPLANT
CATH DIAG EXPO 6F VENT PIG 145 (CATHETERS) ×6 IMPLANT
CATH INFINITI 6F AL2 (CATHETERS) IMPLANT
CATH S G BIP PACING (CATHETERS) ×3 IMPLANT
CLIP VESOCCLUDE MED 24/CT (CLIP) ×3 IMPLANT
CLIP VESOCCLUDE SM WIDE 24/CT (CLIP) ×3 IMPLANT
CONT SPEC 4OZ CLIKSEAL STRL BL (MISCELLANEOUS) ×6 IMPLANT
COVER BACK TABLE 80X110 HD (DRAPES) ×6 IMPLANT
COVER DOME SNAP 22 D (MISCELLANEOUS) ×6 IMPLANT
DERMABOND ADVANCED (GAUZE/BANDAGES/DRESSINGS) ×2
DERMABOND ADVANCED .7 DNX12 (GAUZE/BANDAGES/DRESSINGS) ×4 IMPLANT
DEVICE CLOSURE MYNXGRIP 6/7F (Vascular Products) ×3 IMPLANT
DRAPE INCISE IOBAN 66X45 STRL (DRAPES) ×3 IMPLANT
DRSG SORBAVIEW 3.5X5-5/16 MED (GAUZE/BANDAGES/DRESSINGS) ×3 IMPLANT
DRSG TEGADERM 4X4.75 (GAUZE/BANDAGES/DRESSINGS) ×6 IMPLANT
DRYSEAL FLEXSHEATH 14FR 33CM (SHEATH) ×1
ELECT CAUTERY BLADE 6.4 (BLADE) IMPLANT
ELECT REM PT RETURN 9FT ADLT (ELECTROSURGICAL) ×3
ELECTRODE REM PT RTRN 9FT ADLT (ELECTROSURGICAL) ×2 IMPLANT
FELT TEFLON 6X6 (MISCELLANEOUS) IMPLANT
FEMORAL VENOUS CANN RAP (CANNULA) IMPLANT
GAUZE SPONGE 2X2 8PLY STRL LF (GAUZE/BANDAGES/DRESSINGS) ×2 IMPLANT
GAUZE SPONGE 4X4 12PLY STRL (GAUZE/BANDAGES/DRESSINGS) ×3 IMPLANT
GLOVE BIO SURGEON STRL SZ7.5 (GLOVE) ×3 IMPLANT
GLOVE BIO SURGEON STRL SZ8 (GLOVE) ×3 IMPLANT
GLOVE EUDERMIC 7 POWDERFREE (GLOVE) IMPLANT
GLOVE ORTHO TXT STRL SZ7.5 (GLOVE) IMPLANT
GOWN STRL REUS W/ TWL LRG LVL3 (GOWN DISPOSABLE) ×8 IMPLANT
GOWN STRL REUS W/ TWL XL LVL3 (GOWN DISPOSABLE) ×4 IMPLANT
GOWN STRL REUS W/TWL LRG LVL3 (GOWN DISPOSABLE) ×4
GOWN STRL REUS W/TWL XL LVL3 (GOWN DISPOSABLE) ×2
GUIDEWIRE SAFE TJ AMPLATZ EXST (WIRE) ×3 IMPLANT
INSERT FOGARTY SM (MISCELLANEOUS) IMPLANT
KIT BASIN OR (CUSTOM PROCEDURE TRAY) ×3 IMPLANT
KIT DILATOR VASC 18G NDL (KITS) IMPLANT
KIT HEART LEFT (KITS) ×3 IMPLANT
KIT SUCTION CATH 14FR (SUCTIONS) IMPLANT
KIT TURNOVER KIT B (KITS) ×3 IMPLANT
LOOP VESSEL MAXI BLUE (MISCELLANEOUS) ×3 IMPLANT
LOOP VESSEL MINI RED (MISCELLANEOUS) IMPLANT
NEEDLE 22X1 1/2 (OR ONLY) (NEEDLE) IMPLANT
NEEDLE PERC 18GX7CM (NEEDLE) ×3 IMPLANT
NS IRRIG 1000ML POUR BTL (IV SOLUTION) ×12 IMPLANT
PACK ENDOVASCULAR (PACKS) ×3 IMPLANT
PAD ARMBOARD 7.5X6 YLW CONV (MISCELLANEOUS) ×6 IMPLANT
PAD ELECT DEFIB RADIOL ZOLL (MISCELLANEOUS) ×3 IMPLANT
PENCIL BUTTON HOLSTER BLD 10FT (ELECTRODE) ×3 IMPLANT
SHEATH BRITE TIP 7FR 35CM (SHEATH) ×3 IMPLANT
SHEATH DRYSEAL FLEX 14FR 33CM (SHEATH) ×2 IMPLANT
SHEATH PINNACLE 6F 10CM (SHEATH) ×3 IMPLANT
SHEATH PINNACLE 8F 10CM (SHEATH) ×3 IMPLANT
SLEEVE REPOSITIONING LENGTH 30 (MISCELLANEOUS) ×3 IMPLANT
SPONGE GAUZE 2X2 STER 10/PKG (GAUZE/BANDAGES/DRESSINGS) ×1
SPONGE LAP 4X18 RFD (DISPOSABLE) IMPLANT
STOPCOCK MORSE 400PSI 3WAY (MISCELLANEOUS) ×6 IMPLANT
SUT ETHIBOND X763 2 0 SH 1 (SUTURE) IMPLANT
SUT GORETEX CV 4 TH 22 36 (SUTURE) ×6 IMPLANT
SUT GORETEX CV4 TH-18 (SUTURE) ×3 IMPLANT
SUT MNCRL AB 3-0 PS2 18 (SUTURE) IMPLANT
SUT PROLENE 5 0 C 1 36 (SUTURE) IMPLANT
SUT PROLENE 6 0 C 1 30 (SUTURE) ×3 IMPLANT
SUT SILK  1 MH (SUTURE) ×2
SUT SILK 1 MH (SUTURE) ×4 IMPLANT
SUT VIC AB 2-0 CT1 27 (SUTURE) ×1
SUT VIC AB 2-0 CT1 TAPERPNT 27 (SUTURE) ×2 IMPLANT
SUT VIC AB 2-0 CTX 36 (SUTURE) IMPLANT
SUT VIC AB 3-0 SH 8-18 (SUTURE) IMPLANT
SYR 50ML LL SCALE MARK (SYRINGE) ×3 IMPLANT
SYR BULB IRRIGATION 50ML (SYRINGE) IMPLANT
SYR CONTROL 10ML LL (SYRINGE) IMPLANT
TAPE CLOTH SURG 4X10 WHT LF (GAUZE/BANDAGES/DRESSINGS) ×3 IMPLANT
TOWEL GREEN STERILE (TOWEL DISPOSABLE) ×3 IMPLANT
TOWEL GREEN STERILE FF (TOWEL DISPOSABLE) ×3 IMPLANT
TRANSDUCER W/STOPCOCK (MISCELLANEOUS) ×6 IMPLANT
TRAY FOLEY SLVR 16FR TEMP STAT (SET/KITS/TRAYS/PACK) IMPLANT
VALVE AORTIC EVOLUT PROPLUS 26 (Valve) ×3 IMPLANT
WIRE BENTSON .035X145CM (WIRE) ×3 IMPLANT
WIRE EMERALD 3MM-J .035X150CM (WIRE) ×3 IMPLANT
WIRE EMERALD 3MM-J .035X260CM (WIRE) ×3 IMPLANT

## 2019-06-24 NOTE — Progress Notes (Signed)
  Echocardiogram Echocardiogram Transesophageal has been performed.  Victoria Burns 06/24/2019, 2:00 PM

## 2019-06-24 NOTE — Progress Notes (Signed)
  [place] VALVE TEAM  Patient doing well s/p TAVR. She is hemodynamically stable but BP is elevated requiring a nitro gtt. I will reorder home imdur and also add PRN hydralazine to try to get her off nitro gtt. Groin site and subclavian site are stable. Temp wire in place. ECG with sinus and old RBBB. There is no HAVB or pacing. She has had transient need for pacing by telemetry. EP has been consulted to discuss potential need for PPM.   Angelena Form PA-C  [facility]  Pager [phone]

## 2019-06-24 NOTE — Anesthesia Preprocedure Evaluation (Signed)
Anesthesia Evaluation  Patient identified by MRN, date of birth, ID band Patient awake    Reviewed: Allergy & Precautions, NPO status , Patient's Chart, lab work & pertinent test results  Airway Mallampati: I  TM Distance: >3 FB Neck ROM: Full    Dental   Pulmonary former smoker,    Pulmonary exam normal        Cardiovascular hypertension, Pt. on medications + CAD and + Cardiac Stents  Normal cardiovascular exam     Neuro/Psych    GI/Hepatic   Endo/Other    Renal/GU      Musculoskeletal   Abdominal   Peds  Hematology   Anesthesia Other Findings   Reproductive/Obstetrics                             Anesthesia Physical Anesthesia Plan  ASA: III  Anesthesia Plan: General   Post-op Pain Management:    Induction: Intravenous  PONV Risk Score and Plan: 3 and Midazolam, Ondansetron and Treatment may vary due to age or medical condition  Airway Management Planned: Oral ETT  Additional Equipment: Arterial line  Intra-op Plan:   Post-operative Plan: Extubation in OR  Informed Consent: I have reviewed the patients History and Physical, chart, labs and discussed the procedure including the risks, benefits and alternatives for the proposed anesthesia with the patient or authorized representative who has indicated his/her understanding and acceptance.       Plan Discussed with: CRNA and Surgeon  Anesthesia Plan Comments:         Anesthesia Quick Evaluation

## 2019-06-24 NOTE — Consult Note (Addendum)
Cardiology Consultation:   Patient ID: Victoria Burns MRN: [id number]; DOB: 02-13-1941  Admit date: 06/24/2019 Date of Consult: 06/24/2019  Primary Care Provider: Pleas Koch, NP Primary Cardiologist: Victoria Rogue, Burns  Primary Electrophysiologist:  None    Patient Profile:   Victoria Burns is a 78 y.o. female with a hx of  CAD (multivessel PCI 06/06/2019), PVD w/ hx of b/l CEA, HTN, HLD, hypothyroidism, RBBB, TIA, PVD with new fidning of severe R common iliac disease, VHD w/severe AS who is being seen today for the evaluation of CHB s/p TAVR today at the request of Victoria Burns.  History of Present Illness:   [name] came to [facility] today for his planned TAVR procedure, interop- post procedure she required pacing via her temp wire and given baseline RBBB EP is asked to the case.  LABS K+ 3.9 BUN/Creat 16/0.50 H/H 11.9/35.0  She feels nauseous after her procedure otherwise no complaints  Home meds include Clonidine Lopressor Neither have been ordered for here   Heart Pathway Score:     Past Medical History:  Diagnosis Date   Arthritis    CAD (coronary artery disease)    a. s/p prior stenting; b. 05/2017 Cath ([facility]): LAD mild irregs, LCX 53m, OM2 40 ISR, RCA dominant, RPLV 50; c. 05/2019: s/p succesful orbital atherectomy and stenting of the ostial LCx, proximal OM1 and distal OM1 with DESx 3    Carotid arterial disease ([place])    a. 02/2018 Carotid U/S: 1-39% bilat ICA stenosis. F/u prn.   Carotid artery disease ([place])    a. s/p bilateral CEAs   Chronic back pain    Hyperlipidemia    Hypertension    Hypothyroidism    Juxtafoveal telangietasis of both eyes    PAD (peripheral artery disease) (HCC)    RBBB    S/P TAVR (transcatheter aortic valve replacement)    Severe aortic stenosis    Sleep apnea    does not wear CPAP   TIA (transient ischemic attack)     Past Surgical History:  Procedure Laterality Date   BACK SURGERY   08/2016   BREAST SURGERY     Biopsy   CAROTID ENDARTERECTOMY Bilateral    CHOLECYSTECTOMY     CORONARY ANGIOGRAPHY N/A 06/05/2019   Procedure: CORONARY ANGIOGRAPHY (CATH LAB);  Surgeon: Victoria Burns;  Location: [place] [facility];  Service: Cardiovascular;  Laterality: N/A;   CORONARY ATHERECTOMY N/A 06/06/2019   Procedure: CORONARY ATHERECTOMY;  Surgeon: Victoria Burns;  Location: [place] [facility];  Service: Cardiovascular;  Laterality: N/A;   RIGHT/LEFT HEART CATH AND CORONARY ANGIOGRAPHY Bilateral 05/23/2019   Procedure: RIGHT/LEFT HEART CATH AND CORONARY ANGIOGRAPHY;  Surgeon: Victoria Burns;  Location: [place] [facility];  Service: Cardiovascular;  Laterality: Bilateral;   TONSILLECTOMY AND ADENOIDECTOMY         Inpatient Medications: Scheduled Meds:  chlorhexidine  30 mL Topical UD   chlorhexidine  60 mL Topical Once   And   [START ON 06/25/2019] chlorhexidine  60 mL Topical Once   magnesium sulfate  40 mEq Other To OR   potassium chloride  80 mEq Other To OR   sodium chloride flush  3 mL Intravenous Q12H   Continuous Infusions:  [START ON 06/25/2019] sodium chloride     sodium chloride 50 mL/hr (06/24/19 1504)   sodium chloride     dexmedetomidine     heparin 30,000 units/NS 1000 mL solution for CELLSAVER  lactated ringers     nitroGLYCERIN 15 mcg/min (06/24/19 1524)   PRN Meds: sodium chloride, ondansetron (ZOFRAN) IV, sodium chloride flush  Allergies:    Allergies  Allergen Reactions   Codeine Rash   Penicillins Rash    Did it involve swelling of the face/tongue/throat, SOB, or low BP? No Did it involve sudden or severe rash/hives, skin peeling, or any reaction on the inside of your mouth or nose? No Did you need to seek medical attention at a hospital or doctor's office? No When did it last happen?over 50 years ago If all above answers are NO, may proceed with cephalosporin use.    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   Social Designer, fashion/clothing strain: Not on file   Food insecurity    Worry: Not on file    Inability: Not on file   Transportation needs    Medical: Not on file    Non-medical: Not on file  Tobacco Use   Smoking status: Former Smoker    Types: Cigarettes   Smokeless tobacco: Never Used  Substance and Sexual Activity   Alcohol use: No    Frequency: Never   Drug use: No   Sexual activity: Yes  Lifestyle   Physical activity    Days per week: Not on file    Minutes per session: Not on file   Stress: Not on file  Relationships   Social connections    Talks on phone: Not on file    Gets together: Not on file    Attends religious service: Not on file    Active member of club or organization: Not on file    Attends meetings of clubs or organizations: Not on file    Relationship status: Not on file   Intimate partner violence    Fear of current or ex partner: Not on file    Emotionally abused: Not on file    Physically abused: Not on file    Forced sexual activity: Not on file  Other Topics Concern   Not on file  Social History Narrative   Not on file    Family History:   Family History  Problem Relation Age of Onset   Heart disease Mother    Hyperlipidemia Mother    Hypertension Mother    Alcohol abuse Father    COPD Father    Heart attack Father    Arthritis Sister    Hypertension Sister    Cancer Sister    COPD Sister    Heart attack Sister    Heart disease Sister    Hypertension Sister      ROS:  Please see the history of present illness.  All other ROS reviewed and negative.     Physical Exam/Data:   Vitals:   06/24/19 0857  BP: (!) 110/59  Pulse: 61    Resp: 20  Temp: 97.6 F (36.4 C)  TempSrc: Oral  SpO2: 98%  Weight: 86 kg  Height: 5\' 1"  (1.549 Burns)    Intake/Output Summary (Last 24 hours) at 06/24/2019 1526 Last data filed at 06/24/2019 1354 Gross per 24 hour  Intake 1000 ml  Output 25 ml  Net 975 ml  Last 3 Weights 06/24/2019 06/20/2019 06/09/2019  Weight (lbs) 189 lb 9.5 oz 189 lb 8 oz 193 lb  Weight (kg) 86 kg 85.957 kg 87.544 kg     Body mass index is 35.82 kg/Burns.  General:  Well nourished, well developed, in no acute distress HEENT: normal Lymph: no adenopathy Neck: no JVD Endocrine:  No thryomegaly Vascular: No carotid bruits Cardiac:  RRR; soft SM, no gallops or rubs  Lungs:  clear to auscultation bilaterally, no wheezing, rhonchi or rales  Abd: soft, nontender, no hepatomegaly  Ext: no edema Musculoskeletal:  No deformities, BUE and BLE strength normal and equal Skin: warm and dry  Neuro:  CNs 2-12 intact, no focal abnormalities noted Psych:  Normal affect   EKG:  The EKG was personally reviewed and demonstrates:    SR, 63bpm, RBBB  Telemetry:  Telemetry was personally reviewed and demonstrates:   SR conducting 1:1 currently 60's-70bpm, she came out of procedure V pacing and in holding/post op has had some intermittent brief pacing as well.   Relevant CV Studies:  Echo 05/06/19: 1. Left ventricular ejection fraction, by visual estimation, is 55 to 60%. The left ventricle has normal function. Normal left ventricular size. There is mildly increased left ventricular hypertrophy. 2. Left ventricular diastolic Doppler parameters are consistent with impaired relaxation pattern of LV diastolic filling. 3. Global right ventricle has normal systolic function.The right ventricular size is normal. No increase in right ventricular wall thickness. 4. Left atrial size was mildly dilated. 5. Right atrial size was normal. 6. The mitral valve is normal in structure. Moderate mitral valve regurgitation. 7. The  tricuspid valve is grossly normal. Tricuspid valve regurgitation is trivial. 8. The aortic valve is tricuspid Aortic valve regurgitation is moderate by color flow Doppler. Moderate to severe aortic valve stenosis. 9. Peak aortic valve velocity has increased since 02/2018; peak velocity and valve area are both now in the severe stenosis range. Mean gradient and dimensionless index remain in the moderate range. 10. The pulmonic valve was not well visualized. Pulmonic valve regurgitation is not visualized by color flow Doppler. 11. Normal pulmonary artery systolic pressure. 12. The inferior vena cava is normal in size with greater than 50% respiratory variability, suggesting right atrial pressure of 3 mmHg. 13. The interatrial septum was not well visualized.   06/06/2019: PCI  1st Mrg-2 lesion is 60% stenosed.  A drug-eluting stent was successfully placed using a STENT RESOLUTE ONYX 2.5X18.  Post intervention, there is a 0% residual stenosis.  1st Mrg-1 lesion is 80% stenosed.  A drug-eluting stent was successfully placed using a [place] [id number].  Post intervention, there is a 0% residual stenosis.  Ost Cx to Prox Cx lesion is 75% stenosed.  A drug-eluting stent was successfully placed using a STENT RESOLUTE ONYX 3.5X12.  Post intervention, there is a 0% residual stenosis.   1. Successful orbital atherectomy and stenting of the ostial LCx, proximal OM1 and distal OM1 with DES x3  Plan: DAPT for one year. Anticipate DC in am.   06/05/2019: Cath/attempted PCI  1st Mrg-1 lesion is 80% stenosed.  1st Mrg-2 lesion is 60% stenosed.   1. Severe calcified stenosis in the proximal segment of the first obtuse marginal branch. Moderately severe restenosis in the stented segment of the mid body of the first OM branch 2. Aborted angioplasty of the lesions in the OM branch. Unable to cross the more proximal, calcified lesion with a balloon.   Recommendations:  Will plan staged  PCI  of the obtuse marginal branch tomorrow. The calcified lesion in the obtuse marginal branch will need to be treated with orbital atherectomy prior to stent placement. We do not have a CSI Diamondback orbital atherectomy device in stock today. We have discussed the case with our rep from the company and we should have a device in later today. Will plan PCI tomorrow of the OM branch with orbital atherectomy. Dr. Martinique will perform the PCI. I have reviewed the case with Dr. Martinique today.   Cardiac cath 05/23/19: Left mainstem: Large vessel that bifurcates into the LAD and left circumflex, no significant disease noted  Left anterior descending (LAD): Moderate sized vessel that appears to terminate prior to the apex, diagonal branch 2 of small size, mild to moderate diffuse disease  Left circumflex (LCx): Large vessel with OM branch 2, severe OM disease proximal region estimated 70 to 80%, location appears at proximal stent edge, moderate mid OM disease estimated 60%  Right coronary artery (RCA): Right dominant vessel with PL and PDA, moderate diffuse disease  Left ventriculography: LV gram not performed given contrast load Very difficult time crossing aortic valve Valve was crossed with tremendous difficulty, unable to torque the catheter well secondary to severe common iliac disease Catheter damped, and inability to manipulate catheter off the wall. Not able to obtain accurate aortic valve pressures on pullback  Right heart pressures RA mean of 3 RV 55/10/18 PA 52/17 mean 31 Wedge 25 Cardiac output 4.11 Cardiac index 2.23  Final Conclusions:  Severe disease of OM vessel, appears to be at proximal edge of stent Mild to moderate diffuse disease of RCA, LAD Severe aortic valve stenosis Severe PAD with 80% ostial right common iliac artery Occluded right SFA  Laboratory Data:  High Sensitivity Troponin:  No results for input(s): TROPONINIHS in the last 720 hours.    Chemistry Recent Labs  Lab 06/20/19 1009 06/24/19 1506  NA 139 141  K 4.2 3.9  CL 108 107  CO2 22  --   GLUCOSE 107* 100*  BUN 15 16  CREATININE 0.87 0.50  CALCIUM 10.0  --   GFRNONAA >60  --   GFRAA >60  --   ANIONGAP 9  --     Recent Labs  Lab 06/20/19 1009  PROT 6.8  ALBUMIN 3.9  AST 25  ALT 20  ALKPHOS 64  BILITOT 0.8   Hematology Recent Labs  Lab 06/20/19 1009 06/24/19 1506  WBC 7.6  --   RBC 3.92  --   HGB 13.0 11.9*  HCT 39.7 35.0*  MCV 101.3*  --   MCH 33.2  --   MCHC 32.7  --   RDW 14.6  --   PLT 260  --    BNP Recent Labs  Lab 06/20/19 1009  BNP 123.8*    DDimer No results for input(s): DDIMER in the last 168 hours.   Radiology/Studies:  No results found.  Assessment and Plan:   1. Inter-op, post-op CBH pacing intermittently via temp wire L groin     Today s/pTranscatheter Aortic Valve Replacement - Left Subclavian Artery Approach             Medtronic Evolut Pro+ (size 26 mm,  serial # [id number])    She is currently conducting 1:1 She has baseline RBBB For her TAVR they used L subclavian artery If she needs pacing will need to be R sided Check telemetry in AM Avoid all potential nodal blocking agents NPO after MN   Continue  primary care with structural heart team       For questions or updates, please contact [place] Please consult [email or web address] for contact info under     Signed, Baldwin Jamaica, PA-C  06/24/2019 3:26 PM   I have seen, examined the patient, and reviewed the above assessment and plan.  Changes to above are made where necessary.  On exam, RRR.  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  She will most likely require PPM implantation. Dr Curt Bears to see in am.  Co Sign: Thompson Grayer, Burns 06/24/2019 5:24 PM

## 2019-06-24 NOTE — OR Nursing (Signed)
Spoke with Altha Harm, [place] ICU Charge RN regarding bed need for this TAVR pt due to pacer dependency. She was able to give [place] an ICU bed to put the patient on but the pt. will be going to Cath Lab holding bay 5 until a ICU room is available. Communicated this to Joellen Jersey, NP and Gaspar Bidding, Supervisor in [place] lab regarding the situation.

## 2019-06-24 NOTE — Transfer of Care (Signed)
Immediate Anesthesia Transfer of Care Note  Patient: Victoria Burns  Procedure(s) Performed: TRANSCATHETER AORTIC VALVE REPLACEMENT, LEFT SUBCLAVIAN (Left Chest) TRANSESOPHAGEAL ECHOCARDIOGRAM (TEE) (N/A )  Patient Location: Cath Lab  Anesthesia Type:General  Level of Consciousness: awake, alert  and oriented  Airway & Oxygen Therapy: Patient Spontanous Breathing and Patient connected to face mask oxygen  Post-op Assessment: Report given to RN and Post -op Vital signs reviewed and stable  Post vital signs: Reviewed and stable  Last Vitals:  Vitals Value Taken Time  BP 182/62 06/24/19 1423  Temp    Pulse 62 06/24/19 1426  Resp 18 06/24/19 1426  SpO2 100 % 06/24/19 1426  Vitals shown include unvalidated device data.  Last Pain:  Vitals:   06/24/19 0911  TempSrc:   PainSc: 0-No pain         Complications: No apparent anesthesia complications

## 2019-06-24 NOTE — Interval H&P Note (Signed)
History and Physical Interval Note:  06/24/2019 11:44 AM  Victoria Burns  has presented today for surgery, with the diagnosis of Severe Aortic Stenosis.  The various methods of treatment have been discussed with the patient and family. After consideration of risks, benefits and other options for treatment, the patient has consented to  Procedure(s): TRANSCATHETER AORTIC VALVE REPLACEMENT, LEFT SUBCLAVIAN (Left) TRANSESOPHAGEAL ECHOCARDIOGRAM (TEE) (N/A) as a surgical intervention.  The patient's history has been reviewed, patient examined, no change in status, stable for surgery.  I have reviewed the patient's chart and labs.  Questions were answered to the patient's satisfaction.     Gaye Pollack

## 2019-06-24 NOTE — Anesthesia Procedure Notes (Signed)
Procedure Name: Intubation Date/Time: 06/24/2019 11:33 AM Performed by: Candis Shine, CRNA Pre-anesthesia Checklist: Patient identified, Emergency Drugs available, Suction available and Patient being monitored Patient Re-evaluated:Patient Re-evaluated prior to induction Oxygen Delivery Method: Circle System Utilized Preoxygenation: Pre-oxygenation with 100% oxygen Induction Type: IV induction Ventilation: Mask ventilation without difficulty Laryngoscope Size: Mac and 3 Grade View: Grade I Tube type: Oral Tube size: 7.0 mm Number of attempts: 1 Airway Equipment and Method: Stylet Placement Confirmation: ETT inserted through vocal cords under direct vision,  positive ETCO2 and breath sounds checked- equal and bilateral Tube secured with: Tape Dental Injury: Teeth and Oropharynx as per pre-operative assessment

## 2019-06-24 NOTE — CV Procedure (Signed)
[facility]  TAVR OPERATIVE NOTE   Date of Procedure:  06/24/2019  Preoperative Diagnosis: Severe Aortic Stenosis   Postoperative Diagnosis: Same   Procedure:    Transcatheter Aortic Valve Replacement - TransAxillary/Subclavian Approach  Medtronic Evolut Pro Plus (size 26 mm, model # [id number], serial # [id number])   Co-Surgeons:  Lauree Chandler, MD and Gaye Pollack, MD   Anesthesiologist:  Conrad   Echocardiographer:  Johnsie Cancel  Pre-operative Echo Findings:  Severe aortic stenosis  Normal left ventricular systolic function  Post-operative Echo Findings:  No paravalvular leak  Normal left ventricular systolic function  BRIEF CLINICAL NOTE AND INDICATIONS FOR SURGERY   79 yo female with history of CAD, carotid artery disease s/p bilateral carotid endarterectomies, hyperlipidemia, HTN, hypothyroidism, RBBB, prior TIA and severe aortic stenosis who is here today for TAVR. She has been followed for moderate aortic stenosis. History of CAD with prior stenting in ( 3 in [place], 1 in [place] and 2 in [place]. Cardiac cath 05/23/19 with 70-80% stenosis just prior to the old stent. PCI of the Circumflex performed 06/06/19. Most recent echo October 2020 with LVEF=55-60%, moderate MR. There is moderately severe to severe aortic stenosis. Mean gradient 32 mmHg, peak gradient 69 mmHg, AVA 0.83 cm2, dimensionless index 0.33. She has had bilateral carotid endarterectomies. Ongoing dyspnea and chest pain with exertion.    During the course of the patient's preoperative work up they have been evaluated comprehensively by a multidisciplinary team of specialists coordinated through the [facility] in the [facility].  They have been demonstrated to suffer from symptomatic severe aortic stenosis as noted above. The patient has been counseled extensively as to the relative risks and benefits of all options for the  treatment of severe aortic stenosis including long term medical therapy, conventional surgery for aortic valve replacement, and transcatheter aortic valve replacement.  The patient has been independently evaluated by Dr. Roxy Manns and then Dr. Cyndia Bent with CT surgery and they are felt to be at high risk for conventional surgical aortic valve replacement. The surgeon indicated the patient would be a poor candidate for conventional surgery. Based upon review of all of the patient's preoperative diagnostic tests they are felt to be candidate for transcatheter aortic valve replacement using the transfemoral approach as an alternative to high risk conventional surgery.    Following the decision to proceed with transcatheter aortic valve replacement, a discussion has been held regarding what types of management strategies would be attempted intraoperatively in the event of life-threatening complications, including whether or not the patient would be considered a candidate for the use of cardiopulmonary bypass and/or conversion to open sternotomy for attempted surgical intervention.  The patient has been advised of a variety of complications that might develop peculiar to this approach including but not limited to risks of death, stroke, paravalvular leak, aortic dissection or other major vascular complications, aortic annulus rupture, device embolization, cardiac rupture or perforation, acute myocardial infarction, arrhythmia, heart block or bradycardia requiring permanent pacemaker placement, congestive heart failure, respiratory failure, renal failure, pneumonia, infection, other late complications related to structural valve deterioration or migration, or other complications that might ultimately cause a temporary or permanent loss of functional independence or other long term morbidity.  The patient provides full informed consent for the procedure as described and all questions were answered preoperatively.    DETAILS  OF THE OPERATIVE PROCEDURE  PREPARATION:   The patient is brought to the operating room on  the above mentioned date and central monitoring was established by the anesthesia team including placement of a radial arterial line. The patient is placed in the supine position on the operating table.  Intravenous antibiotics are administered. General anesthesia is used.   Baseline transesophageal echocardiogram was performed. The patient's chest, abdomen, both groins, and both lower extremities are prepared and draped in a sterile manner. A time out procedure is performed.   PERIPHERAL ACCESS:   Using the modified Seldinger technique, femoral arterial and venous access were obtained with placement of a 6 Fr sheath in the left femoral artery and a 7 French sheath in the left femoral vein. A pigtail diagnostic catheter was passed through the femoral arterial sheath under fluoroscopic guidance into the aortic root.  A temporary transvenous pacemaker catheter was passed through the femoral venous sheath under fluoroscopic guidance into the right ventricle.  The pacemaker was tested to ensure stable lead placement and pacemaker capture. Aortic root angiography was performed in order to determine the optimal angiographic angle for valve deployment.  TRANSAXILLARY ACCESS:  Please see Dr. Vivi Martens note for details regarding the cutdown to the axillary artery.    The patient was heparinized systemically and ACT verified > 250 seconds.  A needle was used to enter the axillary artery. A J wire was advanced into the ascending aorta. An 8 French sheath was then placed into the axillary artery. An AL-1 catheter was used to direct a J wire across the aortic valve. A pigtail catheter was used to exchange out this wire for a Confida stiff wire and position was confirmed in the LV apex.   TRANSCATHETER HEART VALVE DEPLOYMENT:  A Medtronic Evolut Pro Plus 26 mm valve was then prepared per manufacturer's guidelines and  delivery system is delivered over the wire after the 8 French sheath was removed and the vessel had been dilated with dilator from a 14 French Dry Seal sheath. The in-line sheath system was used. The valve was advanced across the aortic valve and deployed per protocol. Ventricular pacing was used during deployment. The valve is assessed using TEE. There is felt to be no paravalvular leak and no central aortic insufficiency.  The patient's hemodynamic recovery following valve deployment is good.  The deployment balloon and guidewire are both removed. Echo demostrated acceptable post-procedural gradients, stable mitral valve function, and no AI.   PROCEDURE COMPLETION:  The sheath was then removed and surgical closure per Dr. Cyndia Bent. Protamine was administered. The temporary pacemaker was left in place. The left femoral artery sheath was removed and a Mynx closure device was placed.    The patient tolerated the procedure well and is transported to the surgical intensive care in stable condition. There were no immediate intraoperative complications. All sponge instrument and needle counts are verified correct at completion of the operation.   No blood products were administered during the operation.  The patient received a total of 81 mL of intravenous contrast during the procedure.  Lauree Chandler MD 06/24/2019 1:51 PM

## 2019-06-24 NOTE — Anesthesia Procedure Notes (Signed)
Arterial Line Insertion Start/End12/02/2019 10:40 AM, 06/24/2019 10:55 AM Performed by: Candis Shine, CRNA  Patient location: Pre-op. Preanesthetic checklist: patient identified, IV checked, site marked, risks and benefits discussed, surgical consent, monitors and equipment checked, pre-op evaluation, timeout performed and anesthesia consent Lidocaine 1% used for infiltration Right, radial was placed Catheter size: 20 G Hand hygiene performed  and maximum sterile barriers used   Attempts: 2 (attempted by [facility]) Procedure performed without using ultrasound guided technique. Following insertion, dressing applied and Biopatch. Post procedure assessment: normal

## 2019-06-24 NOTE — Op Note (Signed)
[facility]   MULTIDISCIPLINARY HEART VALVE TEAM   TAVR OPERATIVE NOTE    Victoria Burns [id number]   Date of Procedure:  06/24/2019  Preoperative Diagnosis: Severe Aortic Stenosis   Postoperative Diagnosis: Same   Procedure:    Transcatheter Aortic Valve Replacement - Left Subclavian Artery Approach  Medtronic Evolut Pro+ (size 26 mm,  serial # [id number])   Co-Surgeons:  Gaye Pollack, MD and Lauree Chandler, MD   Anesthesiologist:  Lillia Abed, MD  Echocardiographer:  Jenkins Rouge, MD  Pre-operative Echo Findings:  Severe aortic stenosis and moderate aortic insufficiency  Normal left ventricular systolic function  Post-operative Echo Findings:  No paravalvular leak  Normal left ventricular systolic function   BRIEF CLINICAL NOTE AND INDICATIONS FOR SURGERY  Patient has stage D severe symptomatic aortic stenosis and multivessel coronary artery disease. She describes a long history of worsening symptoms of exertional shortness of breath and fatigue consistent with chronic diastolic congestive heart failure, New York Heart Association functional class III. She is also having frequent episodes of substernal chest pain relieved with nitroglycerin consistent with angina pectoris.   I have personally reviewed the patient's recent transthoracic echocardiogram, diagnostic cardiac catheterization and subsequent PCI studies, and CT angiograms performed earlier today. Transthoracic echocardiogram demonstrates normal left ventricular systolic function with severe aortic stenosis and moderate aortic insufficiency. 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 Severity of aortic stenosis is exacerbated by the presence of relatively small diameter aortic root. The aortic valve is trileaflet with moderate to severe thickening, calcification, and restricted leaflet mobility  involving all 3 leaflets. There is significant aortic insufficiency. Peak velocity across aortic valve measured greater than 4.1 m/s corresponding to mean transvalvular gradient estimated 32 mmHg and aortic valve area 0.8 cm. The DVI was reported 0.33 which is not surprising given the relatively small size of the aortic root and the presence of significant aortic insufficiency. Aortic insufficiency pressure half-time measured 356 ms. There appears to be mild to moderate mitral regurgitation. Diagnostic cardiac catheterization reveals multivessel coronary artery disease with continued patency of stents in multiple locations but significant ostial stenosis of left circumflex coronary artery as well as stenosis of proximal segment of the stent in the obtuse marginal branch, both of which were treated last week with atherectomy and PCI with stenting. Catheterization was also notable for the presence of moderate pulmonary hypertension.   I agree the patient would benefit from aortic valve replacement. Risks associated with conventional surgery would be somewhat elevated because of the patient's advanced age, somewhat limited mobility, and significant calcification in the ascending thoracic aorta. Moreover, the patient has very small size aortic root and would likely require aortic root replacement to avoid the development of patient prosthesis mismatch. Cardiac-gated CTA of the heart reveals anatomical characteristics consistent with aortic stenosis suitable for treatment by transcatheter aortic valve replacement without any significant complicating features other than the fact that the patient's aortic root is quite small. CTA of the aorta and iliac vessels confirms the presence of significant peripheral arterial disease with stenosis of the distal aorta and both common iliac arteries. Although the common iliac arteries are patent she may not have adequate pelvic vascular access to facilitate transfemoral approach.  Left subclavian artery approach appears feasible.  I would be reluctant to consider transcarotid approach given the patient's history of previous bilateral carotid endarterectomies. 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   The patient and her family were counseled at length regarding treatment alternatives for management of severe symptomatic aortic stenosis. Alternative approaches such as conventional aortic valve replacement, transcatheter aortic valve replacement, and continued medical therapy without intervention were compared and contrasted at length. The risks associated with conventional surgical aortic valve replacement were discussed in detail, as were expectations for post-operative convalescence, and why I would be reluctant to consider this patient a candidate for conventional surgery. Issues specific to transcatheter aortic valve replacement were discussed including questions about long term valve durability, the potential for paravalvular leak, possible increased risk of need for permanent pacemaker placement, and other technical complications related to the procedure itself. Long-term prognosis with medical therapy was discussed. This discussion was placed in the context of the patient's own specific clinical presentation and past medical history. All of their questions have been addressed.   Following the decision to proceed with transcatheter aortic valve replacement, a discussion has been held regarding what types of management strategies would be attempted intraoperatively in the event of life-threatening complications, including whether or not the patient would be considered a candidate for the use of cardiopulmonary bypass and/or conversion to open sternotomy for attempted surgical intervention. The patient  specifically requests that should a potentially life-threatening complication develop we would attempt emergency median sternotomy and/or other aggressive surgical procedures. The patient has been advised of a variety of complications that might develop including but not limited to risks of death, stroke, paravalvular leak, aortic dissection or other major vascular complications, aortic annulus rupture, device embolization, cardiac rupture or perforation, mitral regurgitation, acute myocardial infarction, arrhythmia, heart block or bradycardia requiring permanent pacemaker placement, congestive heart failure, respiratory failure, renal failure, pneumonia, infection, other late complications related to structural valve deterioration or migration, or other complications that might ultimately cause a temporary or permanent loss of functional independence or other long term morbidity. The patient provides full informed consent for the procedure as described and all questions were answered.    DETAILS OF THE OPERATIVE PROCEDURE  PREPARATION:    The patient is brought to the operating room on the above mentioned date and central monitoring was established by the anesthesia team including placement of a central venous line and radial arterial line. The patient is placed in the supine position on the operating table.  Intravenous antibiotics are administered.  General endotracheal anesthesia is induced uneventfully. A Foley catheter is placed.  Baseline transesophageal echocardiogram was performed. The patient's chest, abdomen, both groins, and both lower extremities are prepared and draped in a sterile manner. A time out procedure is performed.   PERIPHERAL ACCESS:    Using the modified Seldinger technique, femoral arterial and venous access was obtained with placement of 6 Fr sheaths on the left side.  A pigtail diagnostic catheter was passed through the left arterial sheath under fluoroscopic guidance into  the aortic root.  A temporary transvenous pacemaker catheter was passed through the left femoral venous sheath under fluoroscopic guidance into the right ventricle.  The pacemaker was tested to ensure stable lead placement and pacemaker capture.   TRANS-SUBCLAVIAN ACCESS:  A transverse incision was made below the left clavicle. The pectoralis major muscle was split along its fibers and the pectoralis minor muscle was retracted laterally. The brachial plexus was identified and gently retracted laterally to expose the axillary artery. The artery  was controlled with vessel loops. The patient was heparinized systemically and ACT verified > 250 seconds.    A double concentric purse string suture of CV-4 Gortex suture with small Gortex pledgetts was placed in the left axillary artery. The artery was cannulated with a needle within the purse string sutures and a J-wire was passed into the left axillary artery and advanced into the ascending aorta. The needle was removed and an 8-F sheath inserted over the wire. A straight wire and AL-2 catheter were passed through the sheath and across the valve. The AL-2 was exchanged for a pigtail catheter. Simultaneous LV and Ao pressures were recorded.  The pigtail catheter was exchanged for a Confida wire in the LV apex. Then the pigtail was removed and the Evolut Pro+ delivery system was inserted after dilation of the axillary/subclavian artery with a 14 F dilator. The tip of the sheath was advanced into the aortic arch.    TRANSCATHETER HEART VALVE DEPLOYMENT:   A Medtronic Evolut Pro+ transcatheter heart valve (size 26 mm, serial [id number]) was prepared and crimped per manufacturer's guidelines, and the proper orientation of the valve is confirmed on the delivery system. The valve was advanced through the introducer sheath using normal technique until in an appropriate position in the ascending aorta beyond the sheath tip. The valve was carefully positioned across the  aortic valve annulus. The paralax was removed from the delivery system by moving the detector into the LAO postion.  The valve is carefully deployed using the retractor dial so that 80% of the valve is released. TEE demonstrated trace no AI and the patient's heart rhythm was stable. The valve was fully released during pacing at 120 bpm. There was felt to be no paravalvular leak and no central aortic insufficiency. The patient's hemodynamic recovery following valve deployment is good.  The delivery catheter and guidewire were both removed.    PROCEDURE COMPLETION:   The sheath was removed and subclavian artery closure performed by tieing the purse string sutures. Protamine was administered once subclavian arterial repair was complete. The pigtail catheter and femoral arterial sheath were removed with manual pressure used for hemostasis and a Mynx closure device for the left femoral artery. The left femoral venous sheath and temporary pacemaker were left in place with ventricular pacing at 80.  The patient tolerated the procedure well and is transported to the surgical intensive care in stable condition. There were no immediate intraoperative complications. All sponge instrument and needle counts are verified correct at completion of the operation.   No blood products were administered during the operation.  The patient received a total of 81 mL of intravenous contrast during the procedure.   Gaye Pollack, MD

## 2019-06-24 NOTE — Anesthesia Postprocedure Evaluation (Signed)
Anesthesia Post Note  Patient: Victoria Burns  Procedure(s) Performed: TRANSCATHETER AORTIC VALVE REPLACEMENT, LEFT SUBCLAVIAN (Left Chest) TRANSESOPHAGEAL ECHOCARDIOGRAM (TEE) (N/A )     Patient location during evaluation: PACU Anesthesia Type: General Level of consciousness: awake and alert Pain management: pain level controlled Vital Signs Assessment: post-procedure vital signs reviewed and stable Respiratory status: spontaneous breathing, nonlabored ventilation, respiratory function stable and patient connected to nasal cannula oxygen Cardiovascular status: blood pressure returned to baseline and stable Postop Assessment: no apparent nausea or vomiting Anesthetic complications: no    Last Vitals:  Vitals:   06/24/19 1505 06/24/19 1550  BP: (!) 174/62 (!) 160/63  Pulse: (!) 57 64  Resp: 16 18  Temp:    SpO2: 100% 95%    Last Pain:  Vitals:   06/24/19 0911  TempSrc:   PainSc: 0-No pain                 Reynolds Kittel DAVID

## 2019-06-25 ENCOUNTER — Encounter (HOSPITAL_COMMUNITY): Payer: Self-pay | Admitting: Cardiovascular Disease

## 2019-06-25 ENCOUNTER — Inpatient Hospital Stay (HOSPITAL_COMMUNITY): Payer: Medicare Other

## 2019-06-25 DIAGNOSIS — Z954 Presence of other heart-valve replacement: Secondary | ICD-10-CM

## 2019-06-25 DIAGNOSIS — I442 Atrioventricular block, complete: Secondary | ICD-10-CM

## 2019-06-25 DIAGNOSIS — I350 Nonrheumatic aortic (valve) stenosis: Secondary | ICD-10-CM

## 2019-06-25 DIAGNOSIS — Z952 Presence of prosthetic heart valve: Secondary | ICD-10-CM | POA: Diagnosis not present

## 2019-06-25 LAB — BASIC METABOLIC PANEL
Anion gap: 12 (ref 5–15)
BUN: 12 mg/dL (ref 8–23)
CO2: 21 mmol/L — ABNORMAL LOW (ref 22–32)
Calcium: 8.6 mg/dL — ABNORMAL LOW (ref 8.9–10.3)
Chloride: 106 mmol/L (ref 98–111)
Creatinine, Ser: 0.69 mg/dL (ref 0.44–1.00)
GFR calc Af Amer: >60 mL/min (ref >60)
GFR calc non Af Amer: >60 mL/min (ref >60)
Glucose, Bld: 93 mg/dL (ref 70–99)
Potassium: 3.9 mmol/L (ref 3.5–5.1)
Sodium: 139 mmol/L (ref 135–145)

## 2019-06-25 LAB — MAGNESIUM: Magnesium: 1.8 mg/dL (ref 1.7–2.4)

## 2019-06-25 LAB — CBC
HCT: 35.7 % — ABNORMAL LOW (ref 36.0–46.0)
Hemoglobin: 11.4 g/dL — ABNORMAL LOW (ref 12.0–15.0)
MCH: 32.9 pg (ref 26.0–34.0)
MCHC: 31.9 g/dL (ref 30.0–36.0)
MCV: 103.2 fL — ABNORMAL HIGH (ref 80.0–100.0)
Platelets: 191 10*3/uL (ref 150–400)
RBC: 3.46 MIL/uL — ABNORMAL LOW (ref 3.87–5.11)
RDW: 14.7 % (ref 11.5–15.5)
WBC: 8.6 10*3/uL (ref 4.0–10.5)
nRBC: 0 % (ref 0.0–0.2)

## 2019-06-25 LAB — ECHOCARDIOGRAM COMPLETE
Height: 61 in
Weight: 3111.13 oz

## 2019-06-25 MED ORDER — POTASSIUM CHLORIDE CRYS ER 10 MEQ PO TBCR
10.0000 meq | EXTENDED_RELEASE_TABLET | Freq: Every day | ORAL | Status: DC
  Administered 2019-06-25 – 2019-06-27 (×3): 10 meq via ORAL
  Filled 2019-06-25 (×5): qty 1

## 2019-06-25 MED ORDER — FUROSEMIDE 20 MG PO TABS
20.0000 mg | ORAL_TABLET | Freq: Every day | ORAL | Status: DC
  Administered 2019-06-25 – 2019-06-27 (×3): 20 mg via ORAL
  Filled 2019-06-25 (×4): qty 1

## 2019-06-25 MED ORDER — LOSARTAN POTASSIUM 50 MG PO TABS
100.0000 mg | ORAL_TABLET | Freq: Every day | ORAL | Status: DC
  Administered 2019-06-25 – 2019-06-27 (×3): 100 mg via ORAL
  Filled 2019-06-25 (×3): qty 2

## 2019-06-25 NOTE — Progress Notes (Signed)
1 Day Post-Op Procedure(s) (LRB): TRANSCATHETER AORTIC VALVE REPLACEMENT, LEFT SUBCLAVIAN (Left) TRANSESOPHAGEAL ECHOCARDIOGRAM (TEE) (N/A) Subjective: No complaints.  Has remained at bedrest with temp pacer left groin on VVI 50.  Rhythm has been sinus 70's with 2 brief episodes of wider complex rhythm without bradycardia.  Objective: Vital signs in last 24 hours: Temp:  [97.5 F (36.4 C)-99 F (37.2 C)] 97.8 F (36.6 C) (12/09 0300) Pulse Rate:  [57-76] 66 (12/09 0530) Cardiac Rhythm: Normal sinus rhythm (12/08 2000) Resp:  [12-26] 17 (12/09 0530) BP: (94-191)/(41-96) 145/60 (12/09 0545) SpO2:  [91 %-100 %] 94 % (12/09 0530) Weight:  [86 kg-88.2 kg] 88.2 kg (12/09 0500)  Hemodynamic parameters for last 24 hours:    Intake/Output from previous day: 12/08 0701 - 12/09 0700 In: 1685 [I.V.:1485; IV Piggyback:200] Out: 825 [Urine:800; Blood:25] Intake/Output this shift: No intake/output data recorded.  General appearance: alert and cooperative Neurologic: intact Heart: regular rate and rhythm, S1, S2 normal, no murmur, click, rub or gallop Lungs: clear to auscultation bilaterally Extremities: extremities normal, atraumatic, no cyanosis or edema Wound: incision ok, groin site ok  Lab Results: Recent Labs    06/24/19 1506 06/25/19 0414  WBC  --  8.6  HGB 11.9* 11.4*  HCT 35.0* 35.7*  PLT  --  191   BMET:  Recent Labs    06/24/19 1506 06/25/19 0414  NA 141 139  K 3.9 3.9  CL 107 106  CO2  --  21*  GLUCOSE 100* 93  BUN 16 12  CREATININE 0.50 0.69  CALCIUM  --  8.6*    PT/INR: No results for input(s): LABPROT, INR in the last 72 hours. ABG    Component Value Date/Time   PHART 7.406 06/20/2019 1009   HCO3 24.6 06/20/2019 1009   TCO2 24 06/24/2019 1506   O2SAT 97.7 06/20/2019 1009   CBG (last 3)  No results for input(s): GLUCAP in the last 72 hours.  ECG: sinus, non-specific IV block.  Assessment/Plan: S/P Procedure(s) (LRB): TRANSCATHETER AORTIC  VALVE REPLACEMENT, LEFT SUBCLAVIAN (Left) TRANSESOPHAGEAL ECHOCARDIOGRAM (TEE) (N/A) POD1 She has been hemodynamically stable overnight in sinus rhythm. Does not appear that she has had any further high grade AV block. Will see what EP thinks about need for pacer this am. Rhythm seems very stable and may be worth just putting a ZIO AT monitor on her but will await EP recommendation. She is NPO for now.   DC arterial line  2D echo this am.   LOS: 1 day    Gaye Pollack 06/25/2019

## 2019-06-25 NOTE — Progress Notes (Signed)
 Initial Nutrition Assessment  DOCUMENTATION CODES:   Not applicable  INTERVENTION:   Ensure Enlive po BID, each supplement provides 350 kcal and 20 grams of protein    NUTRITION DIAGNOSIS:   Inadequate oral intake related to acute illness as evidenced by  .  GOAL:   Patient will meet greater than or equal to 90% of their needs  MONITOR:   PO intake, Labs, Supplement acceptance, Weight trends  REASON FOR ASSESSMENT:   Malnutrition Screening Tool    ASSESSMENT:   78 yo female admitted for surgery for  severe aortic stenosis. PMH includes HLD, HTN, CAD   12/08 TAVR  No recorded po intake, pt currently NPO  Pt is unsure if she has lost any weight recently; per weight encounters, no weight loss trend noted. Current wt 88.2 kg  Labs: reviewed Meds: lasix, KCl  Diet Order:   Diet Order            Diet NPO time specified Except for: Sips with Meds  Diet effective midnight              EDUCATION NEEDS:   Not appropriate for education at this time  Skin:  Skin Assessment: Reviewed RN Assessment  Last BM:  12/7  Height:   Ht Readings from Last 1 Encounters:  06/24/19 5\' 1"  (1.549 m)    Weight:   Wt Readings from Last 1 Encounters:  06/25/19 88.2 kg    Ideal Body Weight:  48 kg  BMI:  Body mass index is 36.74 kg/m.  Estimated Nutritional Needs:   Kcal:  1600-1750 kcals  Protein:  85-95 g  Fluid:  >/= 1.6 L    Victoria Bodenheimer MS, RDN, LDN, CNSC [phone] Pager  [phone] Weekend/On-Call Pager

## 2019-06-25 NOTE — Progress Notes (Signed)
  Echocardiogram 2D Echocardiogram has been performed.  Victoria Burns 06/25/2019, 9:47 AM

## 2019-06-25 NOTE — Progress Notes (Signed)
[facility] Evening Rounds  POD 1 s/p TAVR  Awaiting txfer to 4E. Stable hemodynamically  Cranford Blessinger Z. Orvan Seen, Emily

## 2019-06-25 NOTE — Progress Notes (Signed)
  [facility]   MULTIDISCIPLINARY HEART VALVE TEAM   Dr. Curt Bears feels we are safe to pull temp wire. Order diet. Will DC arterial line and transfer to 4e   Angelena Form PA-C  [facility]

## 2019-06-25 NOTE — Progress Notes (Addendum)
Progress Note  Patient Name: Victoria Burns Date of Encounter: 06/25/2019  Primary Cardiologist: Ida Rogue, MD   Subjective   Hungry, otherwise feels well.  Inpatient Medications    Scheduled Meds: . aspirin  81 mg Oral Daily  . atorvastatin  80 mg Oral q1800  . Chlorhexidine Gluconate Cloth  6 each Topical Daily  . clopidogrel  75 mg Oral Daily  . furosemide  20 mg Oral Daily  . isosorbide mononitrate  30 mg Oral BID  . levothyroxine  25 mcg Oral Q0600  . losartan  100 mg Oral Daily  . potassium chloride  10 mEq Oral Daily  . sodium chloride flush  3 mL Intravenous Q12H   Continuous Infusions: . sodium chloride    . levofloxacin (LEVAQUIN) IV 750 mg (06/25/19 1047)   PRN Meds: sodium chloride, acetaminophen **OR** acetaminophen, morphine injection, ondansetron (ZOFRAN) IV, oxyCODONE, promethazine, sodium chloride flush, traMADol   Vital Signs    Vitals:   06/25/19 0545 06/25/19 0700 06/25/19 0800 06/25/19 0900  BP: (!) 145/60  (!) 161/59 (!) 133/54  Pulse:   71 66  Resp:   (!) 24 20  Temp:  99 F (37.2 C)    TempSrc:  Oral    SpO2:   93% 94%  Weight:      Height:        Intake/Output Summary (Last 24 hours) at 06/25/2019 1205 Last data filed at 06/25/2019 1020 Gross per 24 hour  Intake 1688 ml  Output 825 ml  Net 863 ml   Last 3 Weights 06/25/2019 06/24/2019 06/20/2019  Weight (lbs) 194 lb 7.1 oz 189 lb 9.5 oz 189 lb 8 oz  Weight (kg) 88.2 kg 86 kg 85.957 kg      Telemetry    SR 70's - Personally Reviewed  ECG    SR 68bpm, RBBB, PR 158ms, normal axis - Personally Reviewed  Physical Exam   GEN: No acute distress.   Neck: No JVD Cardiac: RRR, no murmurs, rubs, or gallops.  Respiratory: CTA b/l. GI: Soft, nontender, non-distended  MS: No edema; No deformity. Neuro:  Nonfocal  Psych: Normal affect   Labs    High Sensitivity Troponin:  No results for input(s): TROPONINIHS in the last 720 hours.    Chemistry Recent Labs  Lab  06/20/19 1009 06/24/19 1506 06/25/19 0414  NA 139 141 139  K 4.2 3.9 3.9  CL 108 107 106  CO2 22  --  21*  GLUCOSE 107* 100* 93  BUN 15 16 12   CREATININE 0.87 0.50 0.69  CALCIUM 10.0  --  8.6*  PROT 6.8  --   --   ALBUMIN 3.9  --   --   AST 25  --   --   ALT 20  --   --   ALKPHOS 64  --   --   BILITOT 0.8  --   --   GFRNONAA >60  --  >60  GFRAA >60  --  >60  ANIONGAP 9  --  12     Hematology Recent Labs  Lab 06/20/19 1009 06/24/19 1506 06/25/19 0414  WBC 7.6  --  8.6  RBC 3.92  --  3.46*  HGB 13.0 11.9* 11.4*  HCT 39.7 35.0* 35.7*  MCV 101.3*  --  103.2*  MCH 33.2  --  32.9  MCHC 32.7  --  31.9  RDW 14.6  --  14.7  PLT 260  --  191    BNP  Recent Labs  Lab 06/20/19 1009  BNP 123.8*     DDimer No results for input(s): DDIMER in the last 168 hours.   Radiology    Dg Chest Port 1 View Result Date: 06/24/2019 CLINICAL DATA:  Status post TAVR EXAM: PORTABLE CHEST 1 VIEW COMPARISON:  None. FINDINGS: The patient is status post TAVR. An EKG lead projects over the heart. No pneumothorax. Cardiomegaly. The hila and mediastinum are unremarkable. No other acute abnormalities. IMPRESSION: Status post TAVR. No other acute abnormalities. A pacemaker lead remains in place. Electronically Signed   By: Dorise Bullion III M.D   On: 06/24/2019 16:51    Cardiac Studies    06/25/2019 TTE IMPRESSIONS 1. 26 mm Medtronic Evolut Pro Plus TAVR (06/24/2019). Vmax 1.8 m/s, mean gradient 6 mmHG, EOA 1.31 cm2. Normally functioning prosthetic valve without evidence of paravalvular leak.  2. Left ventricular ejection fraction, by visual estimation, is 60 to 65%. The left ventricle has normal function. There is no left ventricular hypertrophy.  3. Abnormal septal motion consistent with post-operative status.  4. Left ventricular diastolic parameters are consistent with Grade I diastolic dysfunction (impaired relaxation).  5. The left ventricle has no regional wall motion abnormalities.   6. Global right ventricle has normal systolic function.The right ventricular size is normal. No increase in right ventricular wall thickness.  7. Left atrial size was normal.  8. Right atrial size was normal.  9. Trivial pericardial effusion is present. 10. The mitral valve is grossly normal. Trivial mitral valve regurgitation. 11. The tricuspid valve is grossly normal. Tricuspid valve regurgitation is trivial. 12. Aortic valve regurgitation is not visualized. 13. The pulmonic valve was grossly normal. Pulmonic valve regurgitation is trivial. 14. Moderately elevated pulmonary artery systolic pressure. 15. The tricuspid regurgitant velocity is 3.46 m/s, and with an assumed right atrial pressure of 3 mmHg, the estimated right ventricular systolic pressure is moderately elevated at 51.0 mmHg. 16. A venous catheter is visualized in the IVC, RA and RV.    06/06/2019: PCI  1st Mrg-2 lesion is 60% stenosed.  A drug-eluting stent was successfully placed using a STENT RESOLUTE ONYX 2.5X18.  Post intervention, there is a 0% residual stenosis.  1st Mrg-1 lesion is 80% stenosed.  A drug-eluting stent was successfully placed using a Cool [id number].  Post intervention, there is a 0% residual stenosis.  Ost Cx to Prox Cx lesion is 75% stenosed.  A drug-eluting stent was successfully placed using a STENT RESOLUTE ONYX 3.5X12.  Post intervention, there is a 0% residual stenosis.  1. Successful orbital atherectomy and stenting of the ostial LCx, proximal OM1 and distal OM1 with DES x3  Plan: DAPT for one year. Anticipate DC in am.   06/05/2019: Cath/attempted PCI  1st Mrg-1 lesion is 80% stenosed.  1st Mrg-2 lesion is 60% stenosed.  1. Severe calcified stenosis in the proximal segment of the first obtuse marginal branch. Moderately severe restenosis in the stented segment of the mid body of the first OM branch 2. Aborted angioplasty of the lesions in the OM branch. Unable  to cross the more proximal, calcified lesion with a balloon.   Recommendations: Charlisha Market plan staged PCI of the obtuse marginal branch tomorrow. The calcified lesion in the obtuse marginal branch Myelle Poteat need to be treated with orbital atherectomy prior to stent placement. We do not have a CSI Diamondback orbital atherectomy device in stock today. We have discussed the case with our rep from the company and we should have a device  in later today. Vrinda Heckstall plan PCI tomorrow of the OM branch with orbital atherectomy. Dr. Martinique Holton Sidman perform the PCI. I have reviewed the case with Dr. Martinique today.   Cardiac cath 05/23/19: Left mainstem: Large vessel that bifurcates into the LAD and left circumflex, no significant disease noted  Left anterior descending (LAD): Moderate sized vessel that appears to terminate prior to the apex, diagonal branch 2 of small size, mild to moderate diffuse disease  Left circumflex (LCx): Large vessel with OM branch 2, severe OM disease proximal region estimated 70 to 80%, location appears at proximal stent edge, moderate mid OM disease estimated 60%  Right coronary artery (RCA): Right dominant vessel with PL and PDA, moderate diffuse disease  Left ventriculography: LV gram not performed given contrast load Very difficult time crossing aortic valve Valve was crossed with tremendous difficulty, unable to torque the catheter well secondary to severe common iliac disease Catheter damped, and inability to manipulate catheter off the wall. Not able to obtain accurate aortic valve pressures on pullback  Right heart pressures RA mean of 3 RV 55/10/18 PA 52/17 mean 31 Wedge 25 Cardiac output 4.11 Cardiac index 2.23  Final Conclusions:  Severe disease of OM vessel, appears to be at proximal edge of stent Mild to moderate diffuse disease of RCA, LAD Severe aortic valve stenosis Severe PAD with 80% ostial right common iliac artery Occluded right SFA  Patient Profile      78 y.o. female with a hx of  CAD (multivessel PCI 06/06/2019), PVD w/ hx of b/l CEA, HTN, HLD, hypothyroidism, RBBB, TIA, PVD with new fidning of severe R common iliac disease, VHD w/severe AS   Admitted yesterday for her TAVR, inter-op, post op with [facility]  Assessment & Plan    1. Inter-op, post-op [facility] pacing intermittently via temp wire L groin     POD #1     Transcatheter Aortic Valve Replacement -Left Subclavian ArteryApproach Medtronic Evolut Pro+(size 12mm, serial # [id number])  She had some intermittent V pacing overnight Dr. Curt Bears has seen and examined the patient this AM Pacing rate decreased to 45 this am and as of yet, no further pacing We Davis Ambrosini revisit this afternoon, if she has not had further pacing, Flay Ghosh hold off on PPM.   Care otherwise as per primary team     For questions or updates, please contact [facility] Please consult [email or web address] for contact info under        Signed, Baldwin [place], PA-C  06/25/2019, 12:05 PM     I have seen and examined this patient with Tommye Standard.  Agree with above, note added to reflect my findings.  On exam, RRR, no murmurs, lungs clear. Patient has not paced during the day today. She paced early this AM, but had the pacer wire set to 60. Miki Labuda d/c the temporary wire and watch overnight. If no further pacing occurs, no pacemaker is necessary.    Judson Tsan M. Torrance Stockley MD 06/25/2019 4:36 PM

## 2019-06-26 DIAGNOSIS — Z952 Presence of prosthetic heart valve: Secondary | ICD-10-CM | POA: Diagnosis not present

## 2019-06-26 DIAGNOSIS — I442 Atrioventricular block, complete: Secondary | ICD-10-CM | POA: Diagnosis not present

## 2019-06-26 MED ORDER — TRAMADOL HCL 50 MG PO TABS
50.0000 mg | ORAL_TABLET | Freq: Four times a day (QID) | ORAL | Status: DC | PRN
  Administered 2019-06-26: 13:00:00 50 mg via ORAL
  Filled 2019-06-26: qty 1

## 2019-06-26 NOTE — Progress Notes (Addendum)
Patient ambulated 235 feet in hallway with front wheel walker. Pt tolerated well.   Arletta Bale, RN

## 2019-06-26 NOTE — Progress Notes (Signed)
2 Days Post-Op Procedure(s) (LRB): TRANSCATHETER AORTIC VALVE REPLACEMENT, LEFT SUBCLAVIAN (Left) TRANSESOPHAGEAL ECHOCARDIOGRAM (TEE) (N/A) Subjective:  Only complaint is of swelling right hand. Up in chair this am.  No bradycardia or high grade heart block overnight.  Objective: Vital signs in last 24 hours: Temp:  [97.7 F (36.5 C)-99 F (37.2 C)] 97.7 F (36.5 C) (12/10 0300) Pulse Rate:  [66-78] 74 (12/10 0600) Cardiac Rhythm: Normal sinus rhythm (12/10 0400) Resp:  [15-26] 17 (12/10 0600) BP: (104-163)/(54-110) 120/66 (12/10 0600) SpO2:  [90 %-99 %] 98 % (12/10 0600) Weight:  [85.7 kg] 85.7 kg (12/10 0500)  Hemodynamic parameters for last 24 hours:    Intake/Output from previous day: 12/09 0701 - 12/10 0700 In: 393 [P.O.:240; I.V.:3; IV Piggyback:150] Out: 1350 [Urine:1350] Intake/Output this shift: No intake/output data recorded.  General appearance: alert and cooperative Neurologic: intact Heart: regular rate and rhythm, S1, S2 normal, no murmur, click, rub or gallop Lungs: clear to auscultation bilaterally Extremities: mild edema left hand Wound: incision ok. left groin sites ok  Lab Results: Recent Labs    06/24/19 1506 06/25/19 0414  WBC  --  8.6  HGB 11.9* 11.4*  HCT 35.0* 35.7*  PLT  --  191   BMET:  Recent Labs    06/24/19 1506 06/25/19 0414  NA 141 139  K 3.9 3.9  CL 107 106  CO2  --  21*  GLUCOSE 100* 93  BUN 16 12  CREATININE 0.50 0.69  CALCIUM  --  8.6*    PT/INR: No results for input(s): LABPROT, INR in the last 72 hours. ABG    Component Value Date/Time   PHART 7.406 06/20/2019 1009   HCO3 24.6 06/20/2019 1009   TCO2 24 06/24/2019 1506   O2SAT 97.7 06/20/2019 1009   CBG (last 3)  No results for input(s): GLUCAP in the last 72 hours.   ECHOCARDIOGRAM REPORT       Patient Name:   Victoria Burns Date of Exam: 06/25/2019 Medical Rec #:  [id number]         Height:       61.0 in Accession #:    [id number]         Weight:       194.4 lb Date of Birth:  [birth year]          BSA:          1.87 m Patient Age:    78 years          BP:           145/60 mmHg Patient Gender: F                 HR:           67 bpm. Exam Location:  Inpatient  Procedure: 2D Echo, Cardiac Doppler and Color Doppler  Indications:    Post TAVR evaluation V43.3   History:        Patient has prior history of Echocardiogram examinations, most                 recent 05/06/2019. CAD, TIA, Aortic Valve Disease; Risk                 Factors:Hypertension, Dyslipidemia and Former Smoker. Aortic                 Valve: A 26 Medtronic, stented aortic valve (TAVR) Procedure  Date: 06/24/2019 PAD.   Sonographer:    Paulita Fujita RDCS Referring Phys: [id number] KATHRYN R THOMPSON  IMPRESSIONS    1. 26 mm Medtronic Evolut Pro Plus TAVR (06/24/2019). Vmax 1.8 m/s, mean gradient 6 mmHG, EOA 1.31 cm2. Normally functioning prosthetic valve without evidence of paravalvular leak.  2. Left ventricular ejection fraction, by visual estimation, is 60 to 65%. The left ventricle has normal function. There is no left ventricular hypertrophy.  3. Abnormal septal motion consistent with post-operative status.  4. Left ventricular diastolic parameters are consistent with Grade I diastolic dysfunction (impaired relaxation).  5. The left ventricle has no regional wall motion abnormalities.  6. Global right ventricle has normal systolic function.The right ventricular size is normal. No increase in right ventricular wall thickness.  7. Left atrial size was normal.  8. Right atrial size was normal.  9. Trivial pericardial effusion is present. 10. The mitral valve is grossly normal. Trivial mitral valve regurgitation. 11. The tricuspid valve is grossly normal. Tricuspid valve regurgitation is trivial. 12. Aortic valve regurgitation is not visualized. 13. The pulmonic valve was grossly normal. Pulmonic valve regurgitation is trivial. 14.  Moderately elevated pulmonary artery systolic pressure. 15. The tricuspid regurgitant velocity is 3.46 m/s, and with an assumed right atrial pressure of 3 mmHg, the estimated right ventricular systolic pressure is moderately elevated at 51.0 mmHg. 16. A venous catheter is visualized in the IVC, RA and RV.  FINDINGS  Left Ventricle: Left ventricular ejection fraction, by visual estimation, is 60 to 65%. The left ventricle has normal function. The left ventricle has no regional wall motion abnormalities. There is no left ventricular hypertrophy. Abnormal  (paradoxical) septal motion consistent with post-operative status. Left ventricular diastolic parameters are consistent with Grade I diastolic dysfunction (impaired relaxation). Normal left atrial pressure.  Right Ventricle: The right ventricular size is normal. No increase in right ventricular wall thickness. Global RV systolic function is has normal systolic function. The tricuspid regurgitant velocity is 3.46 m/s, and with an assumed right atrial pressure  of 3 mmHg, the estimated right ventricular systolic pressure is moderately elevated at 51.0 mmHg.  Left Atrium: Left atrial size was normal in size.  Right Atrium: Right atrial size was normal in size  Pericardium: Trivial pericardial effusion is present.  Mitral Valve: The mitral valve is grossly normal. Trivial mitral valve regurgitation.  Tricuspid Valve: The tricuspid valve is grossly normal. Tricuspid valve regurgitation is trivial.  Aortic Valve: The aortic valve has been repaired/replaced. Aortic valve regurgitation is not visualized. Aortic valve mean gradient measures 6.0 mmHg. Aortic valve peak gradient measures 13.4 mmHg. Aortic valve area, by VTI measures 1.31 cm. 26  Medtronic, stented aortic valve (TAVR) valve is present in the aortic position. Procedure Date: 06/24/2019. 26 mm Medtronic Evolut Pro Plus TAVR (06/24/2019). Vmax 1.8 m/s, mean gradient 6 mmHG, EOA 1.31  cm2. Normally functioning prosthetic valve without  evidence of paravalvular leak.  Pulmonic Valve: The pulmonic valve was grossly normal. Pulmonic valve regurgitation is trivial. Pulmonic regurgitation is trivial.  Aorta: The aortic root is normal in size and structure.  IAS/Shunts: No atrial level shunt detected by color flow Doppler.  Additional Comments: A venous catheter is visualized in the inferior vena cava, right atrium and right ventricle.   LEFT VENTRICLE PLAX 2D LVIDd:         4.64 cm  Diastology LVIDs:         3.16 cm  LV e' lateral:   7.62 cm/s LV PW:  1.00 cm  LV E/e' lateral: 12.3 LV IVS:        1.00 cm  LV e' medial:    5.11 cm/s LVOT diam:     1.80 cm  LV E/e' medial:  18.3 LV SV:         60 ml LV SV Index:   29.91 LVOT Area:     2.54 cm    RIGHT VENTRICLE RV S prime:     15.30 cm/s TAPSE (M-mode): 2.3 cm  LEFT ATRIUM             Index       RIGHT ATRIUM          Index LA diam:        3.50 cm 1.88 cm/m  RA Area:     8.50 cm LA Vol (A2C):   29.6 ml 15.86 ml/m RA Volume:   11.80 ml 6.32 ml/m LA Vol (A4C):   28.7 ml 15.38 ml/m LA Biplane Vol: 31.3 ml 16.77 ml/m  AORTIC VALVE AV Area (Vmax):    1.39 cm AV Area (Vmean):   1.53 cm AV Area (VTI):     1.31 cm AV Vmax:           183.00 cm/s AV Vmean:          112.500 cm/s AV VTI:            0.360 m AV Peak Grad:      13.4 mmHg AV Mean Grad:      6.0 mmHg LVOT Vmax:         99.80 cm/s LVOT Vmean:        67.700 cm/s LVOT VTI:          0.185 m LVOT/AV VTI ratio: 0.51  MITRAL VALVE                        TRICUSPID VALVE MV Area (PHT): 3.21 cm             TR Peak grad:   48.0 mmHg MV PHT:        68.44 msec           TR Vmax:        350.00 cm/s MV Decel Time: 236 msec MV E velocity: 93.60 cm/s 103 cm/s  SHUNTS MV A velocity: 99.80 cm/s 70.3 cm/s Systemic VTI:  0.18 m MV E/A ratio:  0.94       1.5       Systemic Diam: 1.80 cm    Victoria Chiquito MD Electronically signed by Victoria Chiquito  MD Signature Date/Time: 06/25/2019/10:32:37 AM   Assessment/Plan: S/P Procedure(s) (LRB): TRANSCATHETER AORTIC VALVE REPLACEMENT, LEFT SUBCLAVIAN (Left) TRANSESOPHAGEAL ECHOCARDIOGRAM (TEE) (N/A)  POD 2 Hemodynamically stable in sinus rhythm. No further high grade heart block. Will transfer to progressive care today and continue to monitor rhythm, ambulate. Plan home tomorrow with ZIO monitor. I reviewed echo images and aortic valve prosthesis looks good with no paravalvular leak. Mean gradient measured at 6 mm Hg.   LOS: 2 days    Gaye Pollack 06/26/2019

## 2019-06-26 NOTE — Progress Notes (Addendum)
Progress Note  Patient Name: Victoria Burns Date of Encounter: 06/26/2019  Primary Cardiologist: Ida Rogue, MD   Subjective   Feels well, happy to be OOB  Inpatient Medications    Scheduled Meds:  aspirin  81 mg Oral Daily   atorvastatin  80 mg Oral q1800   Chlorhexidine Gluconate Cloth  6 each Topical Daily   clopidogrel  75 mg Oral Daily   furosemide  20 mg Oral Daily   isosorbide mononitrate  30 mg Oral BID   levothyroxine  25 mcg Oral Q0600   losartan  100 mg Oral Daily   potassium chloride  10 mEq Oral Daily   sodium chloride flush  3 mL Intravenous Q12H   Continuous Infusions:  sodium chloride     PRN Meds: sodium chloride, acetaminophen **OR** acetaminophen, ondansetron (ZOFRAN) IV, sodium chloride flush, traMADol   Vital Signs    Vitals:   06/26/19 0800 06/26/19 0833 06/26/19 1000 06/26/19 1100  BP: (!) 118/55  (!) 111/55 112/80  Pulse: 74  77 76  Resp: (!) 22  (!) 33 (!) 26  Temp:  98.6 F (37 C)    TempSrc:  Oral    SpO2: 98%  99% 92%  Weight:      Height:        Intake/Output Summary (Last 24 hours) at 06/26/2019 1143 Last data filed at 06/26/2019 1100 Gross per 24 hour  Intake 540.03 ml  Output 700 ml  Net -159.97 ml   Last 3 Weights 06/26/2019 06/25/2019 06/24/2019  Weight (lbs) 188 lb 15 oz 194 lb 7.1 oz 189 lb 9.5 oz  Weight (kg) 85.7 kg 88.2 kg 86 kg      Telemetry    SR 80's - Personally Reviewed  ECG    No new EKGs - Personally Reviewed  Physical Exam   GEN: No acute distress.   Neck: No JVD Cardiac: RRR, no murmurs, rubs, or gallops.  Respiratory: CTA b/l. GI: Soft, nontender, non-distended  MS: No edema; No deformity. Neuro:  Nonfocal  Psych: Normal affect   Labs    High Sensitivity Troponin:  No results for input(s): TROPONINIHS in the last 720 hours.    Chemistry Recent Labs  Lab 06/20/19 1009 06/24/19 1506 06/25/19 0414  NA 139 141 139  K 4.2 3.9 3.9  CL 108 107 106  CO2 22  --  21*  GLUCOSE  107* 100* 93  BUN 15 16 12   CREATININE 0.87 0.50 0.69  CALCIUM 10.0  --  8.6*  PROT 6.8  --   --   ALBUMIN 3.9  --   --   AST 25  --   --   ALT 20  --   --   ALKPHOS 64  --   --   BILITOT 0.8  --   --   GFRNONAA >60  --  >60  GFRAA >60  --  >60  ANIONGAP 9  --  12     Hematology Recent Labs  Lab 06/20/19 1009 06/24/19 1506 06/25/19 0414  WBC 7.6  --  8.6  RBC 3.92  --  3.46*  HGB 13.0 11.9* 11.4*  HCT 39.7 35.0* 35.7*  MCV 101.3*  --  103.2*  MCH 33.2  --  32.9  MCHC 32.7  --  31.9  RDW 14.6  --  14.7  PLT 260  --  191    BNP Recent Labs  Lab 06/20/19 1009  BNP 123.8*     DDimer  No results for input(s): DDIMER in the last 168 hours.   Radiology    Dg Chest Port 1 View Result Date: 06/24/2019 CLINICAL DATA:  Status post TAVR EXAM: PORTABLE CHEST 1 VIEW COMPARISON:  None. FINDINGS: The patient is status post TAVR. An EKG lead projects over the heart. No pneumothorax. Cardiomegaly. The hila and mediastinum are unremarkable. No other acute abnormalities. IMPRESSION: Status post TAVR. No other acute abnormalities. A pacemaker lead remains in place. Electronically Signed   By: Dorise Bullion III M.D   On: 06/24/2019 16:51    Cardiac Studies    06/25/2019 TTE IMPRESSIONS 1. 26 mm Medtronic Evolut Pro Plus TAVR (06/24/2019). Vmax 1.8 m/s, mean gradient 6 mmHG, EOA 1.31 cm2. Normally functioning prosthetic valve without evidence of paravalvular leak.  2. Left ventricular ejection fraction, by visual estimation, is 60 to 65%. The left ventricle has normal function. There is no left ventricular hypertrophy.  3. Abnormal septal motion consistent with post-operative status.  4. Left ventricular diastolic parameters are consistent with Grade I diastolic dysfunction (impaired relaxation).  5. The left ventricle has no regional wall motion abnormalities.  6. Global right ventricle has normal systolic function.The right ventricular size is normal. No increase in right  ventricular wall thickness.  7. Left atrial size was normal.  8. Right atrial size was normal.  9. Trivial pericardial effusion is present. 10. The mitral valve is grossly normal. Trivial mitral valve regurgitation. 11. The tricuspid valve is grossly normal. Tricuspid valve regurgitation is trivial. 12. Aortic valve regurgitation is not visualized. 13. The pulmonic valve was grossly normal. Pulmonic valve regurgitation is trivial. 14. Moderately elevated pulmonary artery systolic pressure. 15. The tricuspid regurgitant velocity is 3.46 m/s, and with an assumed right atrial pressure of 3 mmHg, the estimated right ventricular systolic pressure is moderately elevated at 51.0 mmHg. 16. A venous catheter is visualized in the IVC, RA and RV.     06/06/2019: PCI 1st Mrg-2 lesion is 60% stenosed. A drug-eluting stent was successfully placed using a STENT RESOLUTE ONYX 2.5X18. Post intervention, there is a 0% residual stenosis. 1st Mrg-1 lesion is 80% stenosed. A drug-eluting stent was successfully placed using a [place] [id number]. Post intervention, there is a 0% residual stenosis. Ost Cx to Prox Cx lesion is 75% stenosed. A drug-eluting stent was successfully placed using a STENT RESOLUTE ONYX 3.5X12. Post intervention, there is a 0% residual stenosis.   1. Successful orbital atherectomy and stenting of the ostial LCx, proximal OM1 and distal OM1 with DES x3   Plan: DAPT for one year. Anticipate DC in am.    06/05/2019: Cath/attempted PCI 1st Mrg-1 lesion is 80% stenosed. 1st Mrg-2 lesion is 60% stenosed.   1. Severe calcified stenosis in the proximal segment of the first obtuse marginal branch. Moderately severe restenosis in the stented segment of the mid body of the first OM branch 2. Aborted angioplasty of the lesions in the OM branch. Unable to cross the more proximal, calcified lesion with a balloon.    Recommendations:  Ande Therrell plan staged PCI of the obtuse marginal branch  tomorrow. The calcified lesion in the obtuse marginal branch Adith Tejada need to be treated with orbital atherectomy prior to stent placement. We do not have a CSI Diamondback orbital atherectomy device in stock today. We have discussed the case with our rep from the company and we should have a device in later today. Naoki Migliaccio plan PCI tomorrow of the OM branch with orbital atherectomy. Dr. Martinique Charleton Deyoung perform  the PCI. I have reviewed the case with Dr. Martinique today.    Cardiac cath 05/23/19: Left mainstem:   Large vessel that bifurcates into the LAD and left circumflex, no significant disease noted  Left anterior descending (LAD): Moderate sized vessel that appears to terminate prior to the apex, diagonal branch 2 of small size, mild to moderate diffuse disease  Left circumflex (LCx):  Large vessel with OM branch 2, severe OM disease proximal region estimated 70 to 80%, location appears at proximal stent edge, moderate mid OM disease estimated 60%  Right coronary artery (RCA):  Right dominant vessel with PL and PDA, moderate diffuse disease  Left ventriculography: LV gram not performed given contrast load Very difficult time crossing aortic valve Valve was crossed with tremendous difficulty, unable to torque the catheter well secondary to severe common iliac disease Catheter damped, and inability to manipulate catheter off the wall.  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 Occluded right SFA  Patient Profile     78 y.o. female with a hx of  CAD (multivessel PCI 06/06/2019), PVD w/ hx of b/l CEA, HTN, HLD, hypothyroidism, RBBB, TIA, PVD with new  fidning of severe R common iliac disease, VHD w/severe AS   Admitted  for her TAVR, inter-op, post op with [facility]  Assessment & Plan    1. Inter-op, post-op CBH pacing intermittently via temp wire L groin     POD #2     Transcatheter Aortic Valve Replacement - Left Subclavian Artery Approach      Medtronic Evolut Pro+ (size 26 mm,  serial # [id number])  Temp wire pulled yesterday She has maintained SR with 1:1 conduction, no bradycardia   EP Dollie Bressi sign off though remain available Please recall if needed  Recommend Zio AT monitoring   Continue care with primary team    For questions or updates, please contact [facility] Please consult [email or web address] for contact info under        Signed, Baldwin [place], PA-C  06/26/2019, 11:43 AM     I have seen and examined this patient with Tommye Standard.  Agree with above, note added to reflect my findings.  On exam, RRR, no murmurs, lungs clear.  Patient is remained in sinus rhythm without evidence of AV block overnight.  We Vesta Wheeland hold off on pacemaker at this point.  EP to sign off.  Please feel free to call us back if any further issues arise.  Saahir Prude M. Briscoe Daniello MD 06/26/2019 11:55 AM

## 2019-06-26 NOTE — Progress Notes (Signed)
CARDIAC REHAB PHASE I   PRE:  Rate/Rhythm: 81 SR    BP: lying 112/80    SaO2: 100 2L, 94 RA  MODE:  Ambulation: walked in room then 34 ft in hall   POST:  Rate/Rhythm: 90 SR    BP: sitting 91/61     SaO2: 95 RA  Pt struggled to bear weight on left leg earlier with RN. She was able to move to EOB and stand with RW, assist x2 standby. She now feels the problem is her left knee lacking support. Took small steps in room with RW then able to walk in hall short distance. Steady with RW, gait belt. Fatigued and dizzy at end of walk, to recliner. BP lower, still c/o dizziness. Encouraged sitting up and walking later with nursing. Will f/u tomorrow. [place], ACSM 06/26/2019 11:19 AM

## 2019-06-26 NOTE — Plan of Care (Signed)
  Problem: Education: Goal: Knowledge of General Education information will improve Description Including pain rating scale, medication(s)/side effects and non-pharmacologic comfort measures Outcome: Progressing   Problem: Health Behavior/Discharge Planning: Goal: Ability to manage health-related needs will improve Outcome: Progressing   

## 2019-06-26 NOTE — Progress Notes (Signed)
Pt received from [place]. Oriented to unit and room. CHG wipes applied. VSS. Call bell in reach. Will continue to monitor.  Arletta Bale, RN

## 2019-06-27 ENCOUNTER — Telehealth: Payer: Self-pay

## 2019-06-27 ENCOUNTER — Other Ambulatory Visit: Payer: Self-pay | Admitting: Physician Assistant

## 2019-06-27 ENCOUNTER — Ambulatory Visit (INDEPENDENT_AMBULATORY_CARE_PROVIDER_SITE_OTHER): Payer: Medicare Other

## 2019-06-27 DIAGNOSIS — I442 Atrioventricular block, complete: Secondary | ICD-10-CM

## 2019-06-27 MED ORDER — AMLODIPINE BESYLATE 5 MG PO TABS: 5.0000 mg | ORAL_TABLET | Freq: Every day | ORAL | 1 refills | Status: DC

## 2019-06-27 MED FILL — Magnesium Sulfate Inj 50%: INTRAMUSCULAR | Qty: 10 | Status: AC

## 2019-06-27 MED FILL — Potassium Chloride Inj 2 mEq/ML: INTRAVENOUS | Qty: 40 | Status: AC

## 2019-06-27 MED FILL — Heparin Sodium (Porcine) Inj 1000 Unit/ML: INTRAMUSCULAR | Qty: 30 | Status: AC

## 2019-06-27 MED FILL — Dexmedetomidine HCl in NaCl 0.9% IV Soln 400 MCG/100ML: INTRAVENOUS | Qty: 100 | Status: AC

## 2019-06-27 NOTE — Discharge Instructions (Signed)
You can try OTC stool softeners or laxatives for constipation. Also glycerin suppositories or enemas are okay. Please keep a log of your BPs for our appointment next week with medication changes. If you need to take a PRN clonidine that is okay.    ACTIVITY AND EXERCISE  Daily activity and exercise are an important part of your recovery. People recover at different rates depending on their general health and type of valve procedure.  Most people recovering from TAVR feel better relatively quickly   No lifting, pushing, pulling more than 10 pounds (examples to avoid: groceries, vacuuming, gardening, golfing):             - For one week with a procedure through the groin.             - For six weeks for procedures through the chest wall or neck. NOTE: You will typically see one of our providers 7-14 days after your procedure to discuss [place] the above activities.      DRIVING  Do not drive until you are seen for follow up and cleared by a provider. Generally, we ask patient to not drive for 1 week after their procedure.  If you have been told by your doctor in the past that you may not drive, you must talk with him/her before you begin driving again.   DRESSING  Groin site: you may leave the clear dressing over the site for up to one week or until it falls off.   HYGIENE  If you had a femoral (leg) procedure, you may take a shower when you return home. After the shower, pat the site dry. Do NOT use powder, oils or lotions in your groin area until the site has completely healed.  If you had a chest procedure, you may shower when you return home unless specifically instructed not to by your discharging practitioner.             - DO NOT scrub incision; pat dry with a towel.             - DO NOT apply any lotions, oils, powders to the incision.             - No tub baths / swimming for at least 2 weeks.  If you notice any fevers, chills, increased pain, swelling, bleeding or  pus, please contact your doctor.   ADDITIONAL INFORMATION  If you are going to have an upcoming dental procedure, please contact our office as you will require antibiotics ahead of time to prevent infection on your heart valve.    If you have any questions or concerns you can call the structural heart phone during normal business hours 8am-4pm. If you have an urgent need after hours or weekends please call [phone] to talk to the on call provider for general cardiology. If you have an emergency that requires immediate attention, please call 911.    After TAVR Checklist  Check  Test Description   Follow up appointment in 1-2 weeks  You will see our structural heart physician assistant, Nell Range. Your incision sites will be checked and you will be cleared to drive and resume all normal activities if you are doing well.     1 month echo and follow up  You will have an echo to check on your new heart valve and be seen back in the office by Nell Range. Many times the echo is not read by your appointment time, but  Joellen Jersey will call you later that day or the following day to report your results.   Follow up with your primary cardiologist You will need to be seen by your primary cardiologist in the following 3-6 months after your 1 month appointment in the valve clinic. Often times your Plavix or Aspirin will be discontinued during this time, but this is decided on a case by case basis.    1 year echo and follow up You will have another echo to check on your heart valve after 1 year and be seen back in the office by Nell Range. This your last structural heart visit.   Bacterial endocarditis prophylaxis  You will have to take antibiotics for the rest of your life before all dental procedures (even teeth cleanings) to protect your heart valve. Antibiotics are also required before some surgeries. Please check with your cardiologist before scheduling any surgeries. Also, please make sure to tell  us if you have a penicillin allergy as you will require an alternative antibiotic.

## 2019-06-27 NOTE — Discharge Summary (Addendum)
[facility] VALVE TEAM  Discharge Summary    Patient ID: Victoria Burns MRN: [id number]; DOB: 1940/10/23  Admit date: 06/24/2019 Discharge date: 06/27/2019  Primary Care Provider: Pleas Koch, NP  Primary Cardiologist: Ida Rogue, MD / Dr. Angelena Form & Dr. Cyndia Bent (TAVR)  Discharge Diagnoses    Principal Problem:   S/P TAVR (transcatheter aortic valve replacement) Active Problems:   Hypothyroidism   Mixed hyperlipidemia   Essential hypertension   PAD (peripheral artery disease) (HCC)   TIA (transient ischemic attack)   RBBB   Severe aortic stenosis   CAD (coronary artery disease)   Sleep apnea   Allergies Allergies  Allergen Reactions  . Codeine Rash  . Penicillins Rash    Did it involve swelling of the face/tongue/throat, SOB, or low BP? No Did it involve sudden or severe rash/hives, skin peeling, or any reaction on the inside of your mouth or nose? No Did you need to seek medical attention at a hospital or doctor's office? No When did it last happen?over 50 years ago If all above answers are "NO", may proceed with cephalosporin use.    Diagnostic Studies/Procedures    TAVR OPERATIVE NOTE    Victoria Burns [id number]   Date of Procedure:                06/24/2019  Preoperative Diagnosis:      Severe Aortic Stenosis   Postoperative Diagnosis:    Same   Procedure:        Transcatheter Aortic Valve Replacement - Left Subclavian Artery Approach             Medtronic Evolut Pro+ (size 26 mm,  serial # [id number])              Co-Surgeons:                        Gaye Pollack, MD and Lauree Chandler, MD   Anesthesiologist:                  Lillia Abed, MD  Echocardiographer:              Jenkins Rouge, MD  Pre-operative Echo Findings: ? Severe aortic stenosis and moderate aortic insufficiency ? Normal left ventricular systolic function  Post-operative Echo Findings: ? No  paravalvular leak ? Normal left ventricular systolic function  _____________   Echo 06/25/19 IMPRESSIONS  1. 26 mm Medtronic Evolut Pro Plus TAVR (06/24/2019). Vmax 1.8 m/s, mean gradient 6 mmHG, EOA 1.31 cm2. Normally functioning prosthetic valve without evidence of paravalvular leak.  2. Left ventricular ejection fraction, by visual estimation, is 60 to 65%. The left ventricle has normal function. There is no left ventricular hypertrophy.  3. Abnormal septal motion consistent with post-operative status.  4. Left ventricular diastolic parameters are consistent with Grade I diastolic dysfunction (impaired relaxation).  5. The left ventricle has no regional wall motion abnormalities.  6. Global right ventricle has normal systolic function.The right ventricular size is normal. No increase in right ventricular wall thickness.  7. Left atrial size was normal.  8. Right atrial size was normal.  9. Trivial pericardial effusion is present. 10. The mitral valve is grossly normal. Trivial mitral valve regurgitation. 11. The tricuspid valve is grossly normal. Tricuspid valve regurgitation is trivial. 12. Aortic valve regurgitation is not visualized. 13. The pulmonic valve was grossly normal. Pulmonic valve regurgitation is trivial. 14. Moderately elevated pulmonary artery  systolic pressure. 15. The tricuspid regurgitant velocity is 3.46 m/s, and with an assumed right atrial pressure of 3 mmHg, the estimated right ventricular systolic pressure is moderately elevated at 51.0 mmHg. 16. A venous catheter is visualized in the IVC, RA and RV.  History of Present Illness     Victoria Burns is a 78 y.o. female with a history of rheumatic fever, CAD s/p previous multiple PCIs, carotid artery disease s/p bilateral CEAs, PAD wtih occlusion of the right SFA and severe stenosis in the right common iliac artery, HLD, HTN, hypothyroidism, RBBB, prior TIA, mod MR and severe aortic stenosis who presented to [facility] on  06/24/19 for planned TAVR.   Patient has history of rheumatic fever during childhood and states that she has been told that she had a heart murmur for the majority of her entire adult life.  [place] the [place] 2 [place] ago she relocated to [place] and she has been followed by Dr. Rockey Situ for the past year and a half. Previous echocardiograms have documented the presence of normal left ventricular systolic function with at least moderate to severe aortic stenosis.  She describes a long history of symptoms of exertional chest pain and shortness of breath dating back at least 2 or 3 years. Symptoms have reportedly gotten much worse over the past 6 months. She now gets short of breath with very low level activity and she gets substernal chest discomfort on a frequent basis. Chest pain is usually short-lived and promptly relieved with sublingual nitroglycerin. Recent follow-up echocardiogram May 06, 2019 revealed normal left ventricular systolic function with ejection fraction estimated 55 to 60%, moderate to severe aortic stenosis with moderate aortic insufficiency.  The patient was referred to the multidisciplinary heart valve clinic and has been evaluated previously by Dr. Angelena Form. Catheterization revealed multivessel coronary artery disease with severe disease of the obtuse marginal branch involving proximal edge of previously placed stent.  Direct assessment of severity of aortic stenosis was not feasible.  There was moderate pulmonary hypertension. The patient was also noted to have severe peripheral arterial disease with 80% ostial stenosis of the right common iliac artery and occluded right superficial femoral artery.  The patient subsequently underwent multivessel PCI and stenting including orbital atherectomy of the ostial left circumflex with stenting of the ostial left circumflex, the proximal first obtuse marginal, and  the distal obtuse marginal branch using a total of 3 drug-eluting stents.  The patient has continued to experience episodes of substernal chest pain since her stent procedure.  The patient has been evaluated by the multidisciplinary valve team and felt to have severe, symptomatic aortic stenosis and to be a suitable candidate for TAVR, which was set up for 06/24/19.  Hospital Course     Consultants: EP   Severe AS: s/p successful TAVR with a 26 mm Medtronic Evolut Pro + Sapien 3 THV via the subclavian approach on 06/24/19. Post operative echo EF 60%, normally functioning TAVR with a mean gradient 6 mm Hg and no PVL. Groin site and subclavian site are stable. ECG with sinus with RBBB, but no high grade heart block. Continued on Asprin and Plavix. Plan for DC home today with close follow up in the office next week.   Transient CHB: patient had a pre-existing RBBB. She went into CHB after TAVR valve deployment. Temp wire was left in place >24 hours. She has been maintaining sinus rhythm with 1:1 conduction.  Plan to discharge home with a Zio AT.   HTN: BP starting to creep up. Will hold home clonidine and Lopresor at discharge given recent CHB. Continue lasix 20mg  daily, imdur 30mg  daily, losartan 100mg  daily. Will add Norvasc 5 mg daily at discharge.   CAD: pre TAVR cath showed multivessel CAD with severe disease of the obtuse marginal branch involving proximal edge of previously placed stent. S/p successful orbital atherectomy and stenting of the ostial LCx, proximal OM1 and distal OM1 with DES x3 on 11/20. Continue Aspirin and plavix and statin. BB to be discontinued given recent CHB. _____________  Discharge Vitals Blood pressure 139/63, pulse 76, temperature 98.5 F (36.9 C), temperature source Oral, resp. rate (!) 25, height 5\' 1"  (1.549 m), weight 59.9 kg, SpO2 92 %.  Filed Weights   06/25/19 0500 06/26/19 0500 06/26/19 1452  Weight: 88.2 kg 85.7 kg 59.9 kg    Labs & Radiologic Studies      CBC Recent Labs    06/24/19 1506 06/25/19 0414  WBC  --  8.6  HGB 11.9* 11.4*  HCT 35.0* 35.7*  MCV  --  103.2*  PLT  --  99991111   Basic Metabolic Panel Recent Labs    06/24/19 1506 06/25/19 0414  NA 141 139  K 3.9 3.9  CL 107 106  CO2  --  21*  GLUCOSE 100* 93  BUN 16 12  CREATININE 0.50 0.69  CALCIUM  --  8.6*  MG  --  1.8   Liver Function Tests No results for input(s): AST, ALT, ALKPHOS, BILITOT, PROT, ALBUMIN in the last 72 hours. No results for input(s): LIPASE, AMYLASE in the last 72 hours. Cardiac Enzymes No results for input(s): CKTOTAL, CKMB, CKMBINDEX, TROPONINI in the last 72 hours. BNP Invalid input(s): POCBNP D-Dimer No results for input(s): DDIMER in the last 72 hours. Hemoglobin A1C No results for input(s): HGBA1C in the last 72 hours. Fasting Lipid Panel No results for input(s): CHOL, HDL, LDLCALC, TRIG, CHOLHDL, LDLDIRECT in the last 72 hours. Thyroid Function Tests No results for input(s): TSH, T4TOTAL, T3FREE, THYROIDAB in the last 72 hours.  Invalid input(s): FREET3 _____________  DG Chest 2 View  Result Date: 06/20/2019 CLINICAL DATA:  Pre-admit for TAVR EXAM: CHEST - 2 VIEW COMPARISON:  None. FINDINGS: The heart size and mediastinal contours are within normal limits. Both lungs are clear. The visualized skeletal structures are unremarkable. IMPRESSION: No active cardiopulmonary disease. Electronically Signed   By: Kathreen Devoid   On: 06/20/2019 15:14   CARDIAC CATHETERIZATION  Result Date: 06/06/2019  1st Mrg-2 lesion is 60% stenosed.  A drug-eluting stent was successfully placed using a STENT RESOLUTE ONYX 2.5X18.  Post intervention, there is a 0% residual stenosis.  1st Mrg-1 lesion is 80% stenosed.  A drug-eluting stent was successfully placed using a [place] [id number].  Post intervention, there is a 0% residual stenosis.  Ost Cx to Prox Cx lesion is 75% stenosed.  A drug-eluting stent was successfully placed using a STENT  RESOLUTE ONYX 3.5X12.  Post intervention, there is a 0% residual stenosis.  1. Successful orbital atherectomy and stenting of the ostial LCx, proximal OM1 and distal OM1 with DES x3 Plan: DAPT for one year. Anticipate DC in am.   CARDIAC CATHETERIZATION  Result Date: 06/05/2019  1st Mrg-1 lesion is 80% stenosed.  1st Mrg-2 lesion is 60% stenosed.  1. Severe calcified stenosis in the proximal segment of the first obtuse marginal branch. Moderately severe restenosis in the  stented segment of the mid body of the first OM branch 2. Aborted angioplasty of the lesions in the OM branch. Unable to cross the more proximal, calcified lesion with a balloon. Recommendations:  Will plan staged PCI of the obtuse marginal branch tomorrow. The calcified lesion in the obtuse marginal branch will need to be treated with orbital atherectomy prior to stent placement. We do not have a CSI Diamondback orbital atherectomy device in stock today. We have discussed the case with our rep from the company and we should have a device in later today. Will plan PCI tomorrow of the OM branch with orbital atherectomy. Dr. Martinique will perform the PCI. I have reviewed the case with Dr. Martinique today.   CT CORONARY MORPH W/CTA COR W/SCORE W/CA W/CM &/OR WO/CM  Addendum Date: 06/09/2019   ADDENDUM REPORT: 06/09/2019 22:18 CLINICAL DATA:  42 -year-old female with aortic stenosis being evaluated for a TAVR procedure. EXAM: Cardiac TAVR CT TECHNIQUE: The patient was scanned on a [employer]. A 120 kV retrospective scan was triggered in the descending thoracic aorta at 111 HU's. Gantry rotation speed was 250 msecs and collimation was .6 mm. No beta blockade or nitro were given. The 3D data set was reconstructed in 5% intervals of the R-R cycle. Systolic and diastolic phases were analyzed on a dedicated work station using MPR, MIP and VRT modes. The patient received 80 cc of contrast. FINDINGS: Aortic Root: Aortic valve:  Trileaflet  Aortic valve calcium score: 582 Aortic annulus: Diameter: 51mm x 38mm Perimeter: 50mm Area: 311 mm^2 Calcifications: No calcifications Coronary height: Min Left - 59mm, Max Left - 68mm; Min Right - 73mm Sinotubular height: Left cusp - 64mm; Right cusp - 70mm; Noncoronary cusp - 68mm LVOT (as measured 3 mm below the annulus): Diameter: 20m x 42mm Area: 321 mm^2 Calcifications: No calcifications Aortic sinus width: Left cusp - 74mm; Right cusp - 69mm; Noncoronary cusp - 57mm Sinotubular junction width: 88mm x 35mm Optimum Fluoroscopic Angle for Delivery (centered on RCC): LAO 4 CRA 3 Cardiac: Right atrium: Normal size Right ventricle: Normal size Pulmonary arteries: Normal size Pulmonary veins: Normal configuration Left atrium: Mild enlargement Left ventricle: Mildly dilated.  Mild concentric hypertrophy Pericardium: Normal thickness IMPRESSION: 1. Trileaflet aortic valve with moderate calcifications (calcium score 582) 2. Aortic annulus measures 8mm x 40mm in diameter with area 311 mm^2 and perimeter 7mm. No aortic annulus or LVOT calcifications. 3. Sufficient coronary to annulus distance. Left main measures 72mm from the annulus, and the RCA measures 64mm from the annulus 4. Optimum Fluoroscopic Angle for Delivery (centered on RCC): LAO 4 CRA 3 Electronically Signed   By: Oswaldo Milian MD   On: 06/09/2019 22:18   Result Date: 06/09/2019 EXAM: OVER-READ INTERPRETATION  CT CHEST The following report is an over-read performed by radiologist Dr. Vinnie Langton of [facility], [place] on 06/09/2019. This over-read does not include interpretation of cardiac or coronary anatomy or pathology. The coronary calcium score/coronary CTA interpretation by the cardiologist is attached. COMPARISON:  No priors. FINDINGS: Extracardiac findings will be described separately under dictation for contemporaneously obtained CTA chest, abdomen and pelvis. IMPRESSION: Please see separate dictation for contemporaneously  obtained CTA chest, abdomen and pelvis dated 06/09/2019 for full description of relevant extracardiac findings. Electronically Signed: By: Vinnie Langton M.D. On: 06/09/2019 11:55   DG Chest Port 1 View  Result Date: 06/24/2019 CLINICAL DATA:  Status post TAVR EXAM: PORTABLE CHEST 1 VIEW COMPARISON:  None. FINDINGS: The patient is status  post TAVR. An EKG lead projects over the heart. No pneumothorax. Cardiomegaly. The hila and mediastinum are unremarkable. No other acute abnormalities. IMPRESSION: Status post TAVR. No other acute abnormalities. A pacemaker lead remains in place. Electronically Signed   By: Dorise Bullion III M.D   On: 06/24/2019 16:51   CT ANGIO CHEST AORTA W &/OR WO CONTRAST  Result Date: 06/09/2019 CLINICAL DATA:  78 year old female with history of severe aortic valve stenosis. Preprocedural study prior to potential transcatheter aortic valve replacement (TAVR) procedure. EXAM: CT ANGIOGRAPHY CHEST, ABDOMEN AND PELVIS TECHNIQUE: Multidetector CT imaging through the chest, abdomen and pelvis was performed using the standard protocol during bolus administration of intravenous contrast. Multiplanar reconstructed images and MIPs were obtained and reviewed to evaluate the vascular anatomy. CONTRAST:  13mL OMNIPAQUE IOHEXOL 350 MG/ML SOLN COMPARISON:  No priors. FINDINGS: CTA CHEST FINDINGS Cardiovascular: Heart size is borderline enlarged. There is no significant pericardial fluid, thickening or pericardial calcification. There is aortic atherosclerosis, as well as atherosclerosis of the great vessels of the mediastinum and the coronary arteries, including calcified atherosclerotic plaque in the left main, left anterior descending, left circumflex and right coronary arteries. Thickening calcification of the aortic valve. Mediastinum/Lymph Nodes: No pathologically enlarged mediastinal or hilar lymph nodes. Esophagus is unremarkable in appearance. No axillary lymphadenopathy. Lungs/Pleura:  Patchy areas of mild scarring in the medial aspects of the lower lobes of the lungs bilaterally. No acute consolidative airspace disease. No pleural effusions. No suspicious appearing pulmonary nodules or masses are noted. Musculoskeletal/Soft Tissues: There are no aggressive appearing lytic or blastic lesions noted in the visualized portions of the skeleton. CTA ABDOMEN AND PELVIS FINDINGS Hepatobiliary: Liver has a shrunken appearance and nodular contour, indicative of underlying cirrhosis. No suspicious cystic or solid hepatic lesions. No significant intra or extrahepatic biliary ductal dilatation. Status post cholecystectomy. Pancreas: No pancreatic mass. No pancreatic ductal dilatation. No pancreatic or peripancreatic fluid collections or inflammatory changes. Spleen: Unremarkable. Adrenals/Urinary Tract: Low-attenuation lesions in both kidneys, compatible with simple cysts, largest of which measures 2.5 cm in the lower pole the right kidney. Other subcentimeter low-attenuation lesions in both kidneys, too small to characterize, but statistically likely to represent tiny cysts. Bilateral adrenal glands are normal in appearance. No hydroureteronephrosis. Urinary bladder is normal in appearance. Stomach/Bowel: Normal appearance of the stomach. No pathologic dilatation of small bowel or colon. Numerous colonic diverticulae are noted, without surrounding inflammatory changes to suggest an acute diverticulitis at this time The appendix is not confidently identified and may be surgically absent. Regardless, there are no inflammatory changes noted adjacent to the cecum to suggest the presence of an acute appendicitis at this time. Vascular/Lymphatic: Aortic atherosclerosis, without evidence of aneurysm or dissection in the abdominal or pelvic vasculature. Vascular findings and measurements pertinent to potential TAVR procedure, as detailed below. Severe stenosis of the distal infrarenal abdominal aorta immediately  before the level of the bifurcation (9 x 5 mm). Complete occlusion of the right superficial femoral artery at the ostium. Distal reconstitution of flow in the right superficial femoral artery, presumably from collateral vessels. No lymphadenopathy noted in the abdomen or pelvis. Reproductive: Uterus and ovaries are atrophic but otherwise unremarkable in appearance. Other: No significant volume of ascites.  No pneumoperitoneum. Musculoskeletal: Status post PLIF at L4-L5 with interbody cages at the L4-L5 interspace. There are no aggressive appearing lytic or blastic lesions noted in the visualized portions of the skeleton. VASCULAR MEASUREMENTS PERTINENT TO TAVR: AORTA: Minimal Aortic Diameter-9 x 5 mm Severity of Aortic Calcification-severe  RIGHT PELVIS: Right Common Iliac Artery - Minimal Diameter-3.4 x 3.5 mm Tortuosity - mild Calcification-severe Right External Iliac Artery - Minimal Diameter-4.3 x 6.1 mm Tortuosity - mild Calcification-mild Right Common Femoral Artery - Minimal Diameter-5.7 x 4.0 mm Tortuosity - mild Calcification-moderate LEFT PELVIS: Left Common Iliac Artery - Minimal Diameter-5.5 x 5.8 mm Tortuosity - mild Calcification-severe Left External Iliac Artery - Minimal Diameter-5.9 x 4.6 mm Tortuosity - mild Calcification-mild Left Common Femoral Artery - Minimal Diameter-5.4 x 3.6 mm Tortuosity-mild Calcification-moderate Review of the MIP images confirms the above findings. IMPRESSION: 1. Vascular findings and measurements pertinent to potential TAVR procedure, as detailed above. 2. Severe thickening calcification of the aortic valve, compatible with the reported clinical history of severe aortic stenosis. 3. Aortic atherosclerosis, in addition to left main and 3 vessel coronary artery disease. 4. Morphologic changes in the liver suggestive of cirrhosis. 5. Colonic diverticulosis without evidence of acute diverticulitis at this time. 6. Additional incidental findings, as above. Electronically  Signed   By: Vinnie Langton M.D.   On: 06/09/2019 12:32   ECHOCARDIOGRAM COMPLETE  Result Date: 06/25/2019   ECHOCARDIOGRAM REPORT   Patient Name:   Victoria Burns Date of Exam: 06/25/2019 Medical Rec #:  [id number]         Height:       61.0 in Accession #:    [id number]        Weight:       194.4 lb Date of Birth:  May 24, 1941          BSA:          1.87 m Patient Age:    72 years          BP:           145/60 mmHg Patient Gender: F                 HR:           67 bpm. Exam Location:  Inpatient Procedure: 2D Echo, Cardiac Doppler and Color Doppler Indications:    Post TAVR evaluation V43.3  History:        Patient has prior history of Echocardiogram examinations, most                 recent 05/06/2019. CAD, TIA, Aortic Valve Disease; Risk                 Factors:Hypertension, Dyslipidemia and Former Smoker. Aortic                 Valve: A 26 Medtronic, stented aortic valve (TAVR) Procedure                 Date: 06/24/2019 PAD.  Sonographer:    Paulita Fujita RDCS Referring Phys: [id number] Luay Balding R Verlene Glantz IMPRESSIONS  1. 26 mm Medtronic Evolut Pro Plus TAVR (06/24/2019). Vmax 1.8 m/s, mean gradient 6 mmHG, EOA 1.31 cm2. Normally functioning prosthetic valve without evidence of paravalvular leak.  2. Left ventricular ejection fraction, by visual estimation, is 60 to 65%. The left ventricle has normal function. There is no left ventricular hypertrophy.  3. Abnormal septal motion consistent with post-operative status.  4. Left ventricular diastolic parameters are consistent with Grade I diastolic dysfunction (impaired relaxation).  5. The left ventricle has no regional wall motion abnormalities.  6. Global right ventricle has normal systolic function.The right ventricular size is normal. No increase in right ventricular wall thickness.  7. Left atrial size was normal.  8. Right  atrial size was normal.  9. Trivial pericardial effusion is present. 10. The mitral valve is grossly normal. Trivial mitral valve  regurgitation. 11. The tricuspid valve is grossly normal. Tricuspid valve regurgitation is trivial. 12. Aortic valve regurgitation is not visualized. 13. The pulmonic valve was grossly normal. Pulmonic valve regurgitation is trivial. 14. Moderately elevated pulmonary artery systolic pressure. 15. The tricuspid regurgitant velocity is 3.46 m/s, and with an assumed right atrial pressure of 3 mmHg, the estimated right ventricular systolic pressure is moderately elevated at 51.0 mmHg. 16. A venous catheter is visualized in the IVC, RA and RV. FINDINGS  Left Ventricle: Left ventricular ejection fraction, by visual estimation, is 60 to 65%. The left ventricle has normal function. The left ventricle has no regional wall motion abnormalities. There is no left ventricular hypertrophy. Abnormal (paradoxical) septal motion consistent with post-operative status. Left ventricular diastolic parameters are consistent with Grade I diastolic dysfunction (impaired relaxation). Normal left atrial pressure. Right Ventricle: The right ventricular size is normal. No increase in right ventricular wall thickness. Global RV systolic function is has normal systolic function. The tricuspid regurgitant velocity is 3.46 m/s, and with an assumed right atrial pressure  of 3 mmHg, the estimated right ventricular systolic pressure is moderately elevated at 51.0 mmHg. Left Atrium: Left atrial size was normal in size. Right Atrium: Right atrial size was normal in size Pericardium: Trivial pericardial effusion is present. Mitral Valve: The mitral valve is grossly normal. Trivial mitral valve regurgitation. Tricuspid Valve: The tricuspid valve is grossly normal. Tricuspid valve regurgitation is trivial. Aortic Valve: The aortic valve has been repaired/replaced. Aortic valve regurgitation is not visualized. Aortic valve mean gradient measures 6.0 mmHg. Aortic valve peak gradient measures 13.4 mmHg. Aortic valve area, by VTI measures 1.31 cm. 26  Medtronic, stented aortic valve (TAVR) valve is present in the aortic position. Procedure Date: 06/24/2019. 26 mm Medtronic Evolut Pro Plus TAVR (06/24/2019). Vmax 1.8 m/s, mean gradient 6 mmHG, EOA 1.31 cm2. Normally functioning prosthetic valve without evidence of paravalvular leak. Pulmonic Valve: The pulmonic valve was grossly normal. Pulmonic valve regurgitation is trivial. Pulmonic regurgitation is trivial. Aorta: The aortic root is normal in size and structure. IAS/Shunts: No atrial level shunt detected by color flow Doppler. Additional Comments: A venous catheter is visualized in the inferior vena cava, right atrium and right ventricle.  LEFT VENTRICLE PLAX 2D LVIDd:         4.64 cm  Diastology LVIDs:         3.16 cm  LV e' lateral:   7.62 cm/s LV PW:         1.00 cm  LV E/e' lateral: 12.3 LV IVS:        1.00 cm  LV e' medial:    5.11 cm/s LVOT diam:     1.80 cm  LV E/e' medial:  18.3 LV SV:         60 ml LV SV Index:   29.91 LVOT Area:     2.54 cm  RIGHT VENTRICLE RV S prime:     15.30 cm/s TAPSE (M-mode): 2.3 cm LEFT ATRIUM             Index       RIGHT ATRIUM          Index LA diam:        3.50 cm 1.88 cm/m  RA Area:     8.50 cm LA Vol (A2C):   29.6 ml 15.86 ml/m RA Volume:   11.80  ml 6.32 ml/m LA Vol (A4C):   28.7 ml 15.38 ml/m LA Biplane Vol: 31.3 ml 16.77 ml/m  AORTIC VALVE AV Area (Vmax):    1.39 cm AV Area (Vmean):   1.53 cm AV Area (VTI):     1.31 cm AV Vmax:           183.00 cm/s AV Vmean:          112.500 cm/s AV VTI:            0.360 m AV Peak Grad:      13.4 mmHg AV Mean Grad:      6.0 mmHg LVOT Vmax:         99.80 cm/s LVOT Vmean:        67.700 cm/s LVOT VTI:          0.185 m LVOT/AV VTI ratio: 0.51 MITRAL VALVE                        TRICUSPID VALVE MV Area (PHT): 3.21 cm             TR Peak grad:   48.0 mmHg MV PHT:        68.44 msec           TR Vmax:        350.00 cm/s MV Decel Time: 236 msec MV E velocity: 93.60 cm/s 103 cm/s  SHUNTS MV A velocity: 99.80 cm/s 70.3 cm/s Systemic  VTI:  0.18 m MV E/A ratio:  0.94       1.5       Systemic Diam: 1.80 cm  Eleonore Chiquito MD Electronically signed by Eleonore Chiquito MD Signature Date/Time: 06/25/2019/10:32:37 AM    Final    ECHO TEE  Result Date: 06/24/2019 TRANSESOPHAGEAL ECHOCARDIOGRAM REPORT  Patient Name:   Victoria Burns Date of Exam: 06/24/2019 Medical Rec #:  [id number]         Height:       61.0 in Accession #:    [id number]        Weight:       189.5 lb Date of Birth:  [birth year]          BSA:          1.85 m Patient Age:    75 years          BP:           110/59 mmHg Patient Gender: F                 HR:           66 bpm. Exam Location:  Inpatient  Procedure: TEE-Intraopertive, 3D Echo, Cardiac Doppler and Color Doppler Indications:     I35.0 Nonrheumatic aortic (valve) stenosis Sonographer:     Roseanna Rainbow RDCS Referring Phys:  [place] Diagnosing Phys: Jenkins Rouge MD PROCEDURE: The patient developed no complications during the procedure. IMPRESSIONS  1. The left ventricle appears to be normal in size, with moderately increased left ventricular hypertrophy wall thickness and have has normal systolic function, with an ejection fraction of 55-60% ejection fraction.  2. The right ventricle has normal systolic function. The cavity was normal. There is No increase in right ventricular wall thickness.  3. Left atrial size was normal.  4. Right atrial size was normal.  5. Trivial pericardial effusion is present.  6. There was a trivial pericardial effusion lateral to the RV free wall that did not  change through out procedure.  7. Aortic valve regurgitation is moderate.  8. 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  9. Pulmonic  regurgitation is mild. 10. Pulmonic valve regurgitation is mild. LEFT VENTRICLE PLAX 2D (Teich) LVOT diam:      1.60 cm LVOT Area:      2.01 cm AORTIC VALVE AV Area (Vmax):    1.52 cm AV Area (Vmean):   1.32 cm AV Area (VTI):     1.06 cm AV Vmax:           1.64 m/s AV Vmean:          1.030 m/s AV VTI:            0.331 m AV Peak Grad:      10.8 mmHg AV Mean Grad:      5.0 mmHg LVOT Vmax:         1.24 m/s LVOT Vmean:        0.677 m/s LVOT VTI:          0.175 m LVOT/AV VTI ratio: 0.53  SHUNTS Pulmonic VTI:  0.175 m Pulmonic Diam: 1.60 cm FINDINGS  Left Ventricular Assist Device:Is mild tricuspid regurgitation is visualized. Left Ventricle: The left ventricle appears to be normal in size with moderately increased left ventricular hypertrophy wall thickness and have has normal systolic function, with an ejection fraction of 55-60% ejection fraction. Right Ventricle: The right ventricle has normal systolic function. The cavity was normal. There is no increase in right ventricular wall thickness. Left Atrium: Left atrial size was normal in size. Right Atrium: Right atrial size was normal in size Interatrial Septum: No atrial level shunt detected by color flow Doppler. Pericardium: Trivial pericardial effusion is present. There was a trivial pericardial effusion lateral to the RV free wall that did not change through out procedure. Mitral Valve: The mitral valve is normal in structure. There is mild thickening of the mitral valve. Tricuspid Valve: The tricuspid valve is normal in structure. Tricuspid valve regurgitation is mild. Aortic Valve: The aortic valve has been repaired/replaced. Aortic valve regurgitation is moderate. 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 Pulmonic Valve: The pulmonic valve was normal in structure. Pulmonic valve regurgitation is mild. Pulmonic regurgitation is mild. Shunts: No atrial level shunt detected by color flow Doppler.  Jenkins Rouge MD Electronically signed by Jenkins Rouge MD Signature Date/Time: 06/24/2019/2:53:27 PM    Final    VAS US CAROTID  Result Date: 06/09/2019 Carotid Arterial Duplex Study Indications:       TAVR. Risk Factors:      Hypertension, coronary artery disease. Limitations        Today's exam was limited due to the patient's respiratory                    variation.  Comparison Study:  02/28/2018 - Bilateral 1-39% ICA stenosis. Right retrograde                    vertebral flow. Performing Technologist: Oliver Hum RVT  Examination Guidelines: A complete evaluation includes B-mode imaging, spectral Doppler, color Doppler, and power Doppler as needed of all accessible portions of each vessel. Bilateral testing is considered an integral part of a complete examination. Limited examinations for reoccurring indications may be performed as noted.  Right Carotid Findings: +----------+--------+--------+--------+-----------------------+--------+           PSV cm/sEDV cm/sStenosisPlaque Description     Comments +----------+--------+--------+--------+-----------------------+--------+ CCA Prox  55      8               smooth and homogeneous          +----------+--------+--------+--------+-----------------------+--------+ CCA Distal51      5               smooth and heterogenous         +----------+--------+--------+--------+-----------------------+--------+ ICA Prox  69      15                                     tortuous +----------+--------+--------+--------+-----------------------+--------+ ICA Distal78      22                                     tortuous +----------+--------+--------+--------+-----------------------+--------+ ECA       96      6                                                +----------+--------+--------+--------+-----------------------+--------+ +----------+--------+-------+--------+-------------------+           PSV cm/sEDV cmsDescribeArm Pressure (mmHG) +----------+--------+-------+--------+-------------------+ Subclavian150                                        +----------+--------+-------+--------+-------------------+ +---------+--------+--------+----------+ VertebralPSV cm/sEDV cm/sRetrograde +---------+--------+--------+----------+  Left Carotid Findings: +----------+--------+--------+--------+-----------------------+--------+           PSV cm/sEDV cm/sStenosisPlaque Description     Comments +----------+--------+--------+--------+-----------------------+--------+ CCA Prox  58      6               smooth and heterogenous         +----------+--------+--------+--------+-----------------------+--------+ CCA Distal62      11              smooth and heterogenous         +----------+--------+--------+--------+-----------------------+--------+ ICA Prox  103     18              smooth and heterogenoustortuous +----------+--------+--------+--------+-----------------------+--------+ ICA Distal110     22                                     tortuous +----------+--------+--------+--------+-----------------------+--------+ ECA       79      3                                               +----------+--------+--------+--------+-----------------------+--------+ +----------+--------+--------+--------+-------------------+  PSV cm/sEDV cm/sDescribeArm Pressure (mmHG) +----------+--------+--------+--------+-------------------+ Subclavian140                                         +----------+--------+--------+--------+-------------------+ +---------+--------+--+--------+--+---------+ VertebralPSV cm/s77EDV cm/s14Antegrade +---------+--------+--+--------+--+---------+   Summary: Right Carotid: Velocities in the right ICA are consistent with a 1-39% stenosis. Left Carotid: Velocities in the left ICA are consistent with a 1-39% stenosis. Vertebrals: Left vertebral artery demonstrates antegrade flow. Right vertebral             artery demonstrates retrograde flow. *See table(s) above for measurements and observations.  Electronically signed by Curt Jews MD on 06/09/2019 at 1:36:06 PM.    Final    Structural Heart Procedure  Result Date: 06/24/2019 See surgical note for result.  CT Angio Abd/Pel w/ and/or w/o  Result Date: 06/09/2019 CLINICAL DATA:  78 year old female with history of severe aortic valve stenosis. Preprocedural study prior to potential transcatheter aortic valve replacement (TAVR) procedure. EXAM: CT ANGIOGRAPHY CHEST, ABDOMEN AND PELVIS TECHNIQUE: Multidetector CT imaging through the chest, abdomen and pelvis was performed using the standard protocol during bolus administration of intravenous contrast. Multiplanar reconstructed images and MIPs were obtained and reviewed to evaluate the vascular anatomy. CONTRAST:  153mL OMNIPAQUE IOHEXOL 350 MG/ML SOLN COMPARISON:  No priors. FINDINGS: CTA CHEST FINDINGS Cardiovascular: Heart size is borderline enlarged. There is no significant pericardial fluid, thickening or pericardial calcification. There is aortic atherosclerosis, as well as atherosclerosis of the great vessels of the mediastinum and the coronary arteries, including calcified atherosclerotic plaque in the left main, left anterior descending, left circumflex and right coronary arteries. Thickening calcification of the aortic valve. Mediastinum/Lymph Nodes: No pathologically enlarged mediastinal or hilar lymph nodes. Esophagus is unremarkable in appearance. No axillary lymphadenopathy. Lungs/Pleura: Patchy areas of mild scarring in the medial aspects of the lower lobes of the lungs bilaterally. No acute consolidative airspace disease. No pleural  effusions. No suspicious appearing pulmonary nodules or masses are noted. Musculoskeletal/Soft Tissues: There are no aggressive appearing lytic or blastic lesions noted in the visualized portions of the skeleton. CTA ABDOMEN AND PELVIS FINDINGS Hepatobiliary: Liver has a shrunken appearance and nodular contour, indicative of underlying cirrhosis. No suspicious cystic or solid hepatic lesions. No significant intra or extrahepatic biliary ductal dilatation. Status post cholecystectomy. Pancreas: No pancreatic mass. No pancreatic ductal dilatation. No pancreatic or peripancreatic fluid collections or inflammatory changes. Spleen: Unremarkable. Adrenals/Urinary Tract: Low-attenuation lesions in both kidneys, compatible with simple cysts, largest of which measures 2.5 cm in the lower pole the right kidney. Other subcentimeter low-attenuation lesions in both kidneys, too small to characterize, but statistically likely to represent tiny cysts. Bilateral adrenal glands are normal in appearance. No hydroureteronephrosis. Urinary bladder is normal in appearance. Stomach/Bowel: Normal appearance of the stomach. No pathologic dilatation of small bowel or colon. Numerous colonic diverticulae are noted, without surrounding inflammatory changes to suggest an acute diverticulitis at this time The appendix is not confidently identified and may be surgically absent. Regardless, there are no inflammatory changes noted adjacent to the cecum to suggest the presence of an acute appendicitis at this time. Vascular/Lymphatic: Aortic atherosclerosis, without evidence of aneurysm or dissection in the abdominal or pelvic vasculature. Vascular findings and measurements pertinent to potential TAVR procedure, as detailed below. Severe stenosis of the distal infrarenal abdominal aorta immediately before the level of the bifurcation (9 x 5 mm). 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 Distal reconstitution of flow  in the right superficial femoral artery, presumably from collateral vessels. No lymphadenopathy noted in the abdomen or pelvis. Reproductive: Uterus and ovaries are atrophic but otherwise unremarkable in appearance. Other: No significant volume of ascites.  No pneumoperitoneum. Musculoskeletal: Status post PLIF at L4-L5 with interbody cages at the L4-L5 interspace. There are no aggressive appearing lytic or blastic lesions noted in the visualized portions of the skeleton. VASCULAR MEASUREMENTS PERTINENT TO TAVR: AORTA: Minimal Aortic Diameter-9 x 5 mm Severity of Aortic Calcification-severe RIGHT PELVIS: Right Common Iliac Artery - Minimal Diameter-3.4 x 3.5 mm Tortuosity - mild Calcification-severe Right External Iliac Artery - Minimal Diameter-4.3 x 6.1 mm Tortuosity - mild Calcification-mild Right Common Femoral Artery - Minimal Diameter-5.7 x 4.0 mm Tortuosity - mild Calcification-moderate LEFT PELVIS: Left Common Iliac Artery - Minimal Diameter-5.5 x 5.8 mm Tortuosity - mild Calcification-severe Left External Iliac Artery - Minimal Diameter-5.9 x 4.6 mm Tortuosity - mild Calcification-mild Left Common Femoral Artery - Minimal Diameter-5.4 x 3.6 mm Tortuosity-mild Calcification-moderate Review of the MIP images confirms the above findings. IMPRESSION: 1. Vascular findings and measurements pertinent to potential TAVR procedure, as detailed above. 2. Severe thickening calcification of the aortic valve, compatible with the reported clinical history of severe aortic stenosis. 3. Aortic atherosclerosis, in addition to left main and 3 vessel coronary artery disease. 4. Morphologic changes in the liver suggestive of cirrhosis. 5. Colonic diverticulosis without evidence of acute diverticulitis at this time. 6. Additional incidental findings, as above. Electronically Signed   By: Vinnie Langton M.D.   On: 06/09/2019 12:32   Disposition   Pt is being discharged home today in good condition.  Follow-up Plans &  Appointments    Follow-up Information    Eileen Stanford, PA-C. Go on 07/02/2019.   Specialties: Cardiology, Radiology Why: @ 1pm, please arrive at least 10 minutes early.  Contact information: [place] [id number] [phone]          Discharge Instructions    Amb Referral to Cardiac Rehabilitation   Complete by: As directed    Diagnosis: Valve Replacement   Valve: Aortic Comment - TAVR   After initial evaluation and assessments completed: Virtual Based Care may be provided alone or in conjunction with Phase 2 Cardiac Rehab based on patient barriers.: Yes      Discharge Medications   Allergies as of 06/27/2019      Reactions   Codeine Rash   Penicillins Rash   Did it involve swelling of the face/tongue/throat, SOB, or low BP? No Did it involve sudden or severe rash/hives, skin peeling, or any reaction on the inside of your mouth or nose? No Did you need to seek medical attention at a hospital or doctor's office? No When did it last happen?over 50 years ago If all above answers are "NO", may proceed with cephalosporin use.      Medication List    STOP taking these medications   cloNIDine 0.1 MG tablet Commonly known as: CATAPRES   metoprolol tartrate 25 MG tablet Commonly known as: LOPRESSOR     TAKE these medications   amLODipine 5 MG tablet Commonly known as: NORVASC Take 1 tablet (5 mg total) by mouth daily.   aspirin 81 MG chewable tablet Chew 81 mg by mouth daily.   atorvastatin 80 MG tablet Commonly known as: LIPITOR Take 1 tablet (80 mg total) by mouth daily at 6 PM.   clopidogrel 75 MG tablet Commonly known  as: PLAVIX Take 1 tablet (75 mg total) by mouth daily.   FOCUSED MIND PO Take 1 tablet by mouth daily.   furosemide 20 MG tablet Commonly known as: LASIX Take 1 tablet (20 mg total) by mouth daily.   isosorbide mononitrate 30 MG 24 hr tablet Commonly known as: IMDUR Take 1 tablet (30 mg  total) by mouth 2 (two) times daily.   levothyroxine 25 MCG tablet Commonly known as: SYNTHROID TAKE 1 TABLET BY MOUTH IN  THE MORNING ON AN EMPTY  STOMACH WITH WATER - NO  FOOD OR MEDICATIONS FOR 1/2 HOUR   losartan 100 MG tablet Commonly known as: COZAAR Take 1 tablet (100 mg total) by mouth daily.   nitroGLYCERIN 0.3 MG SL tablet Commonly known as: NITROSTAT Place 1 tablet (0.3 mg total) under the tongue every 5 (five) minutes as needed.   potassium chloride 10 MEQ tablet Commonly known as: KLOR-CON Take 1 tablet (10 mEq total) by mouth daily.   Systane 0.4-0.3 % Soln Generic drug: Polyethyl Glycol-Propyl Glycol Place 1 drop into both eyes 2 (two) times daily as needed (Dry eye).   VITAMIN D PO Take 1 tablet by mouth daily.   ZINC PO Take 1 tablet by mouth daily.           Outstanding Labs/Studies   none  Duration of Discharge Encounter   Greater than 30 minutes including physician time.  Mable Fill, PA-C 06/27/2019, 11:34 AM [phone]

## 2019-06-27 NOTE — Progress Notes (Signed)
CARDIAC REHAB PHASE I   PRE:  Rate/Rhythm: 73 SR  BP:  Supine: 139/63  Sitting:   Standing:    SaO2: 95%RA  MODE:  Ambulation: 300 ft   POST:  Rate/Rhythm: 94 SR  BP:  Supine:   Sitting: 160/95  Standing:    SaO2: 94%RA [id number] Pt assisted to bathroom and then walked 300 ft on RA with rolling walker and asst x 2. Tolerated well. Stated she has walker at home. Denied shortness of breath or dizziness. To recliner after walk. Encouraged pt to walk as tolerated at home two to three times a day. Gave heart healthy diet and encouraged her to watch sodium. Discussed CRP 2 and referred to [facility]. Pt will think about attending program.   Graylon Good, RN BSN  06/27/2019 9:23 AM

## 2019-06-27 NOTE — Progress Notes (Signed)
3 Days Post-Op Procedure(s) (LRB): TRANSCATHETER AORTIC VALVE REPLACEMENT, LEFT SUBCLAVIAN (Left) TRANSESOPHAGEAL ECHOCARDIOGRAM (TEE) (N/A) Subjective: No complaints, walking well and ready to go home.  Objective: Vital signs in last 24 hours: Temp:  [98.5 F (36.9 C)-99.5 F (37.5 C)] 98.5 F (36.9 C) (12/11 0338) Pulse Rate:  [73-92] 76 (12/11 0338) Cardiac Rhythm: Normal sinus rhythm;Bundle branch block;Supraventricular tachycardia (12/11 0341) Resp:  [16-33] 24 (12/11 0338) BP: (87-157)/(44-80) 157/67 (12/11 0338) SpO2:  [92 %-99 %] 92 % (12/11 0338) Weight:  [59.9 kg] 59.9 kg (12/10 1452)  Hemodynamic parameters for last 24 hours:    Intake/Output from previous day: 12/10 0701 - 12/11 0700 In: 332.1 [P.O.:330; I.V.:2.1] Out: 150 [Urine:150] Intake/Output this shift: No intake/output data recorded.  General appearance: alert and cooperative Neurologic: intact Heart: regular rate and rhythm, S1, S2 normal, no murmur, click, rub or gallop Lungs: clear to auscultation bilaterally Extremities: right hand edema improved. palpable radial pulse and hand is warm and pink Wound: incision healing well. left groin site ok  Lab Results: Recent Labs    06/24/19 1506 06/25/19 0414  WBC  --  8.6  HGB 11.9* 11.4*  HCT 35.0* 35.7*  PLT  --  191   BMET:  Recent Labs    06/24/19 1506 06/25/19 0414  NA 141 139  K 3.9 3.9  CL 107 106  CO2  --  21*  GLUCOSE 100* 93  BUN 16 12  CREATININE 0.50 0.69  CALCIUM  --  8.6*    PT/INR: No results for input(s): LABPROT, INR in the last 72 hours. ABG    Component Value Date/Time   PHART 7.406 06/20/2019 1009   HCO3 24.6 06/20/2019 1009   TCO2 24 06/24/2019 1506   O2SAT 97.7 06/20/2019 1009   CBG (last 3)  No results for input(s): GLUCAP in the last 72 hours.  Assessment/Plan: S/P Procedure(s) (LRB): TRANSCATHETER AORTIC VALVE REPLACEMENT, LEFT SUBCLAVIAN (Left) TRANSESOPHAGEAL ECHOCARDIOGRAM (TEE) (N/A)  She remains  in sinus rhythm with no high grade heart block or significant arrhythmia. Her BP is elevated so will start on Norvasc since we want to avoid BB with heart block postop. May be able to resume BB later if rhythm remains stable over the next few months.   Will plan to send home today with ZIO monitor. She can shower at home. Incision is coated with Dermabond.    LOS: 3 days    Victoria Burns 06/27/2019

## 2019-06-27 NOTE — Telephone Encounter (Signed)
Left message on voicemail to return my call- need to complete TCM and schedule hospital follow up.  

## 2019-06-28 DIAGNOSIS — I442 Atrioventricular block, complete: Secondary | ICD-10-CM | POA: Diagnosis not present

## 2019-06-29 ENCOUNTER — Telehealth: Payer: Self-pay | Admitting: Cardiology

## 2019-06-29 NOTE — Telephone Encounter (Signed)
Possible reaction to lasix or plavix? Perhaps we can hold the  lasix(sulfa) Could change to brilinta trial?

## 2019-06-29 NOTE — Telephone Encounter (Signed)
Ms. Meisenheimer called the answering service with complaints of a rash.  I called her back.  She had TAVR on 06/24/2019.  She was discharged on 06/27/2019 (Friday).  He notes that she began to have a rash on her buttocks on Wednesday, which was very itchy.  She still had the rash at discharge on Friday.  The rash has progressed and is now covering most of her torso down to her knees and upper arms.  She is not having any difficulty breathing.  The only new medication that she was started on was amlodipine which was started after discharge.  Initiation of the rash preceded the amlodipine.  She has also taken amlodipine in the past without any difficulty.  She has not tried any treatment for the rash.  We discussed trying Benadryl 25 mg and possibly could use some hydrocortisone cream.  If the rash persists or gets worse she will call the office tomorrow.

## 2019-06-30 ENCOUNTER — Other Ambulatory Visit: Payer: Self-pay | Admitting: Physician Assistant

## 2019-06-30 ENCOUNTER — Encounter: Payer: Self-pay | Admitting: Physician Assistant

## 2019-06-30 MED ORDER — PREDNISONE 10 MG (21) PO TBPK: ORAL_TABLET | ORAL | 0 refills | Status: AC

## 2019-06-30 NOTE — Telephone Encounter (Signed)
Thanks Victoria Burns.

## 2019-06-30 NOTE — Progress Notes (Addendum)
[facility] [place]                                       Cardiology Office Note    Date:  07/02/2019   ID:  Victoria Burns, DOB 1940-11-02, MRN [id number]  PCP:  Pleas Koch, NP  Cardiologist: Ida Rogue, MD / Dr. Angelena Form & Dr. Cyndia Bent (TAVR)  CC: TOC s/p TAVR  History of Present Illness:  Victoria Burns is a 78 y.o. female with a history of rheumatic fever, CAD s/p previous multiple PCIs, carotid artery disease s/p bilateral CEAs, PAD wtih occlusion of the right SFA and severe stenosis in the right common iliac artery, HLD, HTN, hypothyroidism, RBBB, prior TIA, mod MR and severe AS s/p TAVR (06/24/19) who presents to clinic for follow up.   Patient has history of rheumatic fever during childhood and states that she has been told that she had a heart murmur for the majority of her entire adult life. [place] the [place] ago she relocated to [place] and she has been followed by Dr. Rockey Situ for the past year and a half. Previous echocardiograms have documented the presence of normal left ventricular systolic function with at least moderate to severe aortic stenosis. She describes a long history of symptoms of exertional chest pain and shortness of breath dating back at least 2 or 3 years. Symptoms have reportedly gotten much worse over the past 6 months. She now gets short of breath with very low level activity and she gets substernal chest discomfort on a frequent basis. Chest pain is usually short-lived and promptly relieved with sublingual nitroglycerin. Recent follow-up echocardiogram May 06, 2019 revealed normal left ventricular systolic function with ejection fraction estimated 55 to 60%, moderate to severe aortic stenosis with moderate aortic insufficiency.The patient was referred to the multidisciplinary heart valve clinic and has  been evaluated previously by Dr. Angelena Form. Catheterization revealed multivessel coronary artery disease with severe disease of the obtuse marginal branch involving proximal edge of previously placed stent. Direct assessment of severity of aortic stenosis was not feasible. There was moderate pulmonary hypertension. The patient was also noted to have severe peripheral arterial disease with 80% ostial stenosis of the right common iliac artery and occluded right superficial femoral artery. The patient subsequently underwent multivessel PCI and stenting including orbital atherectomy of the ostial left circumflex with stenting of the ostial left circumflex, the proximal first obtuse marginal, and the distal obtuse marginal branch using a total of 3 drug-eluting stents. The patient continued to experience episodes of substernal chest pain since her stent procedure.  She was evaluated by the multidisciplinary valve team and underwent successful TAVR with a 26 mm Medtronic Evolut Pro + Sapien 3 THV via the subclavian approach on 06/24/19. Post operative echo EF 60%, normally functioning TAVR with a mean gradient 6 mm Hg and no PVL. She developed transient CHB after TAVR in the setting of chronic RBBB. Her heart rhythm recovered and temp wire was pulled. A Zio patch was placed at discharge. She was discharged on aspirin and plavix. Her Lopressor and Clonidine were discontinued given risk for HAVB and Norvasc started at discharge.   She later called the office complaining of a diffuse, pruritic, erythematous rash that started during her admission and has worsened.  This was felt to be most likely related to contrast dye exposure. She was started on Zyrtec and a prendnisone taper.   Today she presents to clinic for follow up.  The patient is doing much better.  Her rash improved almost immediately with prednisone.  She is tearful in the office today because she still has some mild dyspnea and expected to be totally  cured from her valve replacement.  She does think that her shortness of breath has improved since TAVR but it is not completely gone.  She denies lower extremity edema, orthopnea or PND. No chest pain.  No dizziness or syncope.  No weakness or fatigue.  She does think she might have some mild depression.   Past Medical History:  Diagnosis Date  . Arthritis   . CAD (coronary artery disease)    a. s/p prior stenting; b. 05/2017 Cath ([facility]): LAD mild irregs, LCX 45m, OM2 40 ISR, RCA dominant, RPLV 50; c. 05/2019: s/p succesful orbital atherectomy and stenting of the ostial LCx, proximal OM1 and distal OM1 with DESx 3   . Carotid arterial disease ([place])    a. 02/2018 Carotid U/S: 1-39% bilat ICA stenosis. F/u prn.  . Carotid artery disease ([place])    a. s/p bilateral CEAs  . Chronic back pain   . Hyperlipidemia   . Hypertension   . Hypothyroidism   . Juxtafoveal telangietasis of both eyes   . PAD (peripheral artery disease) ([place])   . RBBB   . S/P TAVR (transcatheter aortic valve replacement)   . Severe aortic stenosis   . Sleep apnea    does not wear CPAP  . TIA (transient ischemic attack)     Past Surgical History:  Procedure Laterality Date  . BACK SURGERY  08/2016  . BREAST SURGERY     Biopsy  . CAROTID ENDARTERECTOMY Bilateral   . CHOLECYSTECTOMY    . CORONARY ANGIOGRAPHY N/A 06/05/2019   Procedure: CORONARY ANGIOGRAPHY (CATH LAB);  Surgeon: Burnell Blanks, MD;  Location: [facility];  Service: Cardiovascular;  Laterality: N/A;  . CORONARY ATHERECTOMY N/A 06/06/2019   Procedure: CORONARY ATHERECTOMY;  Surgeon: Martinique, Peter M, MD;  Location: [facility];  Service: Cardiovascular;  Laterality: N/A;  . RIGHT/LEFT HEART CATH AND CORONARY ANGIOGRAPHY Bilateral 05/23/2019   Procedure: RIGHT/LEFT HEART CATH AND CORONARY ANGIOGRAPHY;  Surgeon: Minna Merritts, MD;  Location: [facility];  Service: Cardiovascular;  Laterality: Bilateral;  .  TEE WITHOUT CARDIOVERSION N/A 06/24/2019   Procedure: TRANSESOPHAGEAL ECHOCARDIOGRAM (TEE);  Surgeon: Burnell Blanks, MD;  Location: [facility];  Service: Open Heart Surgery;  Laterality: N/A;  . TONSILLECTOMY AND ADENOIDECTOMY      Current Medications: Outpatient Medications Prior to Visit  Medication Sig Dispense Refill  . amLODipine (NORVASC) 5 MG tablet Take 1 tablet (5 mg total) by mouth daily. 30 tablet 1  . aspirin 81 MG chewable tablet Chew 81 mg by mouth daily.    Marland Kitchen atorvastatin (LIPITOR) 80 MG tablet Take 1 tablet (80 mg total) by mouth daily at 6 PM. 90 tablet 2  . clopidogrel (PLAVIX) 75 MG tablet Take 1 tablet (75 mg total) by mouth daily. 90 tablet 1  . furosemide (LASIX) 20 MG tablet Take 1 tablet (20 mg total) by mouth daily. 90 tablet 1  . isosorbide mononitrate (IMDUR) 30 MG 24 hr tablet Take 1 tablet (30 mg total) by mouth 2 (two) times daily. 180 tablet 2  . levothyroxine (  SYNTHROID) 25 MCG tablet TAKE 1 TABLET BY MOUTH IN  THE MORNING ON AN EMPTY  STOMACH WITH WATER - NO  FOOD OR MEDICATIONS FOR 1/2 HOUR 90 tablet 0  . losartan (COZAAR) 100 MG tablet Take 1 tablet (100 mg total) by mouth daily. 90 tablet 2  . Misc Natural Products (FOCUSED MIND PO) Take 1 tablet by mouth daily.     . Multiple Vitamins-Minerals (ZINC PO) Take 1 tablet by mouth daily.     . nitroGLYCERIN (NITROSTAT) 0.3 MG SL tablet Place 1 tablet (0.3 mg total) under the tongue every 5 (five) minutes as needed. 25 tablet 1  . Polyethyl Glycol-Propyl Glycol (SYSTANE) 0.4-0.3 % SOLN Place 1 drop into both eyes 2 (two) times daily as needed (Dry eye).    . potassium chloride (KLOR-CON) 10 MEQ tablet Take 1 tablet (10 mEq total) by mouth daily. 90 tablet 1  . predniSONE (STERAPRED UNI-PAK 21 TAB) 10 MG (21) TBPK tablet Take 6 tablets (60 mg total) by mouth daily for 1 day, THEN 5 tablets (50 mg total) daily for 1 day, THEN 4 tablets (40 mg total) daily for 1 day, THEN 3 tablets (30 mg total) daily for 1 day,  THEN 2 tablets (20 mg total) daily for 1 day, THEN 1 tablet (10 mg total) daily for 1 day. 21 tablet 0  . VITAMIN D PO Take 1 tablet by mouth daily.      No facility-administered medications prior to visit.     Allergies:   Iodinated diagnostic agents, Codeine, and Penicillins   Social History   Socioeconomic History  . Marital status: Married    Spouse name: Not on file  . Number of children: 3  . Years of education: Not on file  . Highest education level: Not on file  Occupational History  . 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   Tobacco Use  . Smoking status: Former Smoker    Types: Cigarettes  . Smokeless tobacco: Never Used  Substance and Sexual Activity  . Alcohol use: No  . Drug use: No  . Sexual activity: Yes  Other Topics Concern  . Not on file  Social History Narrative  . Not on file   Social Determinants of Health   Financial Resource Strain:   . Difficulty of Paying Living Expenses: Not on file  Food Insecurity:   . Worried About Charity fundraiser in the Last Year: Not on file  . Ran Out of Food in the Last Year: Not on file  Transportation Needs:   . Lack of Transportation (Medical): Not on file  . Lack of Transportation (Non-Medical): Not on file  Physical Activity:   . Days of Exercise per Week: Not on file  . Minutes of Exercise per Session: Not on file  Stress:   . Feeling of Stress : Not on file  Social Connections:   . Frequency of Communication with Friends and Family: Not on file  . Frequency of Social Gatherings with Friends and Family: Not on file  . Attends Religious Services: Not on file  . Active Member of Clubs or Organizations: Not on file  . Attends Archivist Meetings: Not on file  . Marital Status: Not on file     Family History:  The patient's family history includes Alcohol abuse in her father; Arthritis in her sister; COPD in her father and sister; Cancer in her sister; Heart attack in her father and sister; Heart  disease in her mother  and sister; Hyperlipidemia in her mother; Hypertension in her mother, sister, and sister.     ROS:   Please see the history of present illness.    ROS All other systems reviewed and are negative.   PHYSICAL EXAM:   VS:  BP 130/80   Pulse 75   Ht 5\' 1"  (1.549 m)   Wt 189 lb 3.2 oz (85.8 kg)   BMI 35.75 kg/m    GEN: Well nourished, well developed, in no acute distress, obese HEENT: normal Neck: no JVD or masses Cardiac: RRR; no murmurs, rubs, or gallops,no edema  Respiratory:  clear to auscultation bilaterally, normal work of breathing GI: soft, nontender, nondistended, + BS MS: no deformity or atrophy Skin: warm and dry, no rash.  Subclavian site is healing well with mild ecchymosis.  Groin site without hematoma or ecchymosis. Neuro:  Alert and Oriented x 3, Strength and sensation are intact Psych: euthymic mood, full affect.  Tearful in the office   Wt Readings from Last 3 Encounters:  07/02/19 189 lb 3.2 oz (85.8 kg)  06/26/19 132 lb (59.9 kg)  06/20/19 189 lb 8 oz (86 kg)      Studies/Labs Reviewed:   EKG:  EKG is ordered today.  The ekg ordered today demonstrates NSR with RBBB and hr 75 bpm  Recent Labs: 06/20/2019: ALT 20; B Natriuretic Peptide 123.8 06/25/2019: BUN 12; Creatinine, Ser 0.69; Hemoglobin 11.4; Magnesium 1.8; Platelets 191; Potassium 3.9; Sodium 139   Lipid Panel    Component Value Date/Time   CHOL 123 01/23/2018 1059   TRIG 90.0 01/23/2018 1059   HDL 48.10 01/23/2018 1059   CHOLHDL 3 01/23/2018 1059   VLDL 18.0 01/23/2018 1059   LDLCALC 57 01/23/2018 1059    Additional studies/ records that were reviewed today include:  TAVR OPERATIVE NOTE   Date of Procedure:06/24/2019  Preoperative Diagnosis:Severe Aortic Stenosis   Postoperative Diagnosis:Same   Procedure:   Transcatheter Aortic Valve Replacement -Left Subclavian ArteryApproach Medtronic Evolut Pro+(size  63mm, serial # [id number])  Co-Surgeons:Bryan Alveria Apley, MD and Lauree Chandler, MD   Anesthesiologist:Kevin Conrad Shokan, MD  Echocardiographer:Peter Johnsie Cancel, MD  Pre-operative Echo Findings: ? Severe aortic stenosisand moderate aortic insufficiency ? Normal left ventricular systolic function  Post-operative Echo Findings: ? Noparavalvular leak ? Normal left ventricular systolic function  _____________    Echo 06/25/19 IMPRESSIONS 1. 26 mm Medtronic Evolut Pro Plus TAVR (06/24/2019). Vmax 1.8 m/s, mean gradient 6 mmHG, EOA 1.31 cm2. Normally functioning prosthetic valve without evidence of paravalvular leak. 2. Left ventricular ejection fraction, by visual estimation, is 60 to 65%. The left ventricle has normal function. There is no left ventricular hypertrophy. 3. Abnormal septal motion consistent with post-operative status. 4. Left ventricular diastolic parameters are consistent with Grade I diastolic dysfunction (impaired relaxation). 5. The left ventricle has no regional wall motion abnormalities. 6. Global right ventricle has normal systolic function.The right ventricular size is normal. No increase in right ventricular wall thickness. 7. Left atrial size was normal. 8. Right atrial size was normal. 9. Trivial pericardial effusion is present. 10. The mitral valve is grossly normal. Trivial mitral valve regurgitation. 11. The tricuspid valve is grossly normal. Tricuspid valve regurgitation is trivial. 12. Aortic valve regurgitation is not visualized. 13. The pulmonic valve was grossly normal. Pulmonic valve regurgitation is trivial. 14. Moderately elevated pulmonary artery systolic pressure. 15. The tricuspid regurgitant velocity is 3.46 m/s, and with an assumed right atrial pressure of 3 mmHg, the estimated right ventricular systolic pressure is moderately elevated  at 51.0 mmHg. 16. A venous  catheter is visualized in the IVC, RA and RV.   ASSESSMENT & PLAN:   Severe AS s/p TAVR:Groin and subclavian sites are healing well.  EKG with NSR and RBBB, but no HAVB.  SBE prophylaxis discussed; cephalexin was prescribed today due to a penicillin allergy.  Continue aspirin and Plavix.  I will see her back at the end of this month for 1 month follow-up and echocardiogram.  Transient CHB: ECG stable today.  Zio patch in place with no alerts so far.  Continue off beta-blocker for now  HTN: Blood pressure is well controlled today.  I reviewed her log from home which is mostly under good control.  When her blood pressure is over 140/90 she does take a half of a clonidine.  CAD: pre TAVR cath showed multivessel CAD with severe disease of the obtuse marginal branch involving proximal edge of previously placed stent. S/p successful orbital atherectomy and stenting of the ostial LCx, proximal OM1 and distal OM1 with DES x3 on 11/20. Continue Aspirin and plavix and statin. BB discontinued given underlying conduction disease and CHB s/p TAVR.  Rash: much improved with steroids. I have listed iodinated contrast as an allergy in her medical records.   Depression: very tearful in office today. I think she had a inflated expectations for her valve surgery. I also think the pandemic has contributed. I asked her to reach out to her PCP about an antidepressant if this continues.   Medication Adjustments/Labs and Tests Ordered: Current medicines are reviewed at length with the patient today.  Concerns regarding medicines are outlined above.  Medication changes, Labs and Tests ordered today are listed in the Patient Instructions below. Patient Instructions  Medication Instructions:  Your provider discussed the importance of taking an antibiotic prior to all dental visits to prevent damage to the heart valves from infection. You were given a prescription for CEPHALEXIN 2,000 mg to take one hour prior to any  dental appointment.   Follow-Up: Please keep your appointments as scheduled!    Signed, Angelena Form, PA-C  07/02/2019 1:50 PM    [facility] [address], [place], [place]  [address] Phone: [phone]; Fax: [phone]

## 2019-06-30 NOTE — Telephone Encounter (Signed)
  [facility] VALVE TEAM   Patient contacted regarding discharge from [facility] on 06/27/19  Patient understands to follow up with provider Nell Range on 12/16 at [facility].  Patient understands discharge instructions? yes Patient understands medications and regimen? yes Patient understands to bring all medications to this visit? yes  Angelena Form PA-C  [facility]

## 2019-06-30 NOTE — Telephone Encounter (Signed)
I spoke with patient who has a severe, pruritic, diffuse, raised erythematous rash all over her body. I reviewed a photograph and the rash looks like hives. As documented previously, the rash started  prior to starting Novasc and she has been on lasix and plavix for a while. I think the most likely  culprit is contrast dye reaction although she also had several recent contrast studies (cath and CTs) with no difficulty. I have called in a prednisone dose pack to her pharmacy and asked her to take a Zyrtec in the morning and benadryl at night (bc it is very sedating for her). I have also asked her to reach out to her PCP, Alma Friendly, for further instructions if rash does not improve.   Angelena Form PA-C  [facility]

## 2019-06-30 NOTE — Telephone Encounter (Signed)
Transition Care Management Follow-up Telephone Call  Date of discharge and from where: [facility]   How have you been since you were released from the hospital? Patient states that she is feeling okay just working on recovering.   Any questions or concerns? No   Items Reviewed:  Did the pt receive and understand the discharge instructions provided? Yes   Medications obtained and verified? Yes   Any new allergies since your discharge? No   Dietary orders reviewed? Yes  Do you have support at home? Yes   Functional Questionnaire: (I = Independent and D = Dependent) ADLs: I  Bathing/Dressing- I  Meal Prep- I  Eating- I  Maintaining continence- I  Transferring/Ambulation- I  Managing Meds- I  Follow up appointments reviewed:   PCP Hospital f/u appt confirmed? Patient declined following up with PCP at this time   [facility] f/u appt confirmed? Yes Scheduled to see cardiology.  Are transportation arrangements needed? No   If their condition worsens, is the pt aware to call PCP or go to the Emergency Dept.? Yes  Was the patient provided with contact information for the PCP's office or ED? Yes  Was to pt encouraged to call back with questions or concerns? Yes

## 2019-07-02 ENCOUNTER — Other Ambulatory Visit: Payer: Self-pay

## 2019-07-02 ENCOUNTER — Ambulatory Visit (INDEPENDENT_AMBULATORY_CARE_PROVIDER_SITE_OTHER): Payer: Medicare Other | Admitting: Physician Assistant

## 2019-07-02 ENCOUNTER — Encounter: Payer: Self-pay | Admitting: Physician Assistant

## 2019-07-02 VITALS — BP 130/80 | HR 75 | Ht 61.0 in | Wt 189.2 lb

## 2019-07-02 DIAGNOSIS — I25118 Atherosclerotic heart disease of native coronary artery with other forms of angina pectoris: Secondary | ICD-10-CM | POA: Diagnosis not present

## 2019-07-02 DIAGNOSIS — R21 Rash and other nonspecific skin eruption: Secondary | ICD-10-CM

## 2019-07-02 DIAGNOSIS — I442 Atrioventricular block, complete: Secondary | ICD-10-CM | POA: Diagnosis not present

## 2019-07-02 DIAGNOSIS — I10 Essential (primary) hypertension: Secondary | ICD-10-CM | POA: Diagnosis not present

## 2019-07-02 DIAGNOSIS — Z952 Presence of prosthetic heart valve: Secondary | ICD-10-CM

## 2019-07-02 MED ORDER — CEPHALEXIN 500 MG PO CAPS: ORAL_CAPSULE | ORAL | 11 refills | Status: DC

## 2019-07-02 NOTE — Patient Instructions (Signed)
Medication Instructions:  Your provider discussed the importance of taking an antibiotic prior to all dental visits to prevent damage to the heart valves from infection. You were given a prescription for CEPHALEXIN 2,000 mg to take one hour prior to any dental appointment.   Follow-Up: Please keep your appointments as scheduled!

## 2019-07-07 ENCOUNTER — Other Ambulatory Visit: Payer: Self-pay | Admitting: Cardiovascular Disease

## 2019-07-07 ENCOUNTER — Telehealth: Payer: Self-pay | Admitting: Physician Assistant

## 2019-07-07 NOTE — Telephone Encounter (Signed)
  [facility]   MULTIDISCIPLINARY HEART VALVE TEAM   Husband called about a pressure ulcer on her back. They sent me a picture which looked like a stage II pressure ulcer. It did not look infected. I asked them to clean it, place Vaseline and gauze on it with a bandage. I also asked her to not sleep on her back and sit with a donut cushion to relieve the pressure. If it worsens they need to see her PCP.  Angelena Form PA-C  [facility]

## 2019-07-14 ENCOUNTER — Other Ambulatory Visit: Payer: Self-pay

## 2019-07-14 MED ORDER — AMLODIPINE BESYLATE 5 MG PO TABS: 5.0000 mg | ORAL_TABLET | Freq: Every day | ORAL | 3 refills | Status: DC

## 2019-07-16 NOTE — Progress Notes (Signed)
[facility]                                       Cardiology Office Note    Date:  07/29/2019   ID:  Victoria Burns, DOB 05/13/1941, MRN [id number]  PCP:  Pleas Koch, NP  Cardiologist: Dr. Saunders Revel (pt requested) / Dr. Angelena Form & Dr. Cyndia Bent (TAVR)  CC: 1 month s/p TAVR  History of Present Illness:  Victoria Burns is a 78 y.o. female with a history of rheumatic fever, CAD s/p previous multiple PCIs, carotid artery disease s/p bilateral CEAs, PAD wtih occlusion of the right SFA and severe stenosis in the right common iliac artery, HLD, HTN, hypothyroidism, RBBB, prior TIA, mod MR and severe AS s/p TAVR (06/24/19) who presents to clinic for follow up.   Patient has history of rheumatic fever during childhood and states that she has been told that she had a heart murmur for the majority of her entire adult life. In the past [place] 2 [place] ago she relocated to [place] and she has been followed by Dr. Rockey Situ for the past year and a half. Previous echocardiograms have documented the presence of normal left ventricular systolic function with at least moderate to severe aortic stenosis. She describes a long history of symptoms of exertional chest pain and shortness of breath dating back at least 2 or 3 years. Symptoms have reportedly gotten much worse over the past 6 months. She now gets short of breath with very low level activity and she gets substernal chest discomfort on a frequent basis. Chest pain is usually short-lived and promptly relieved with sublingual nitroglycerin. Recent follow-up echocardiogram May 06, 2019 revealed normal left ventricular systolic function with ejection fraction estimated 55 to 60%, moderate to severe aortic stenosis with moderate aortic insufficiency.The patient was referred to the multidisciplinary heart valve clinic  and has been evaluated previously by Dr. Angelena Form. Catheterization revealed multivessel coronary artery disease with severe disease of the obtuse marginal branch involving proximal edge of previously placed stent. Direct assessment of severity of aortic stenosis was not feasible. There was moderate pulmonary hypertension. The patient was also noted to have severe peripheral arterial disease with 80% ostial stenosis of the right common iliac artery and occluded right superficial femoral artery. The patient subsequently underwent multivessel PCI and stenting including orbital atherectomy of the ostial left circumflex with stenting of the ostial left circumflex, the proximal first obtuse marginal, and the distal obtuse marginal branch using a total of 3 drug-eluting stents.The patient continued to experience episodes of substernal chest pain after stenting.   She was evaluated by the multidisciplinary valve team and underwent successful TAVR with a 26 mm Medtronic Evolut Pro + Sapien 3 THV via the subclavian approach on 06/24/19. Post operative echo EF 60%, normally functioning TAVR with a mean gradient 6 mm Hg and no PVL. She developed transient CHB after TAVR in the setting of chronic RBBB. Her heart rhythm recovered and temp wire was pulled. A Zio patch was placed at discharge. She was discharged on aspirin and plavix. Her Lopressor and Clonidine were discontinued given risk for HAVB and Norvasc started at discharge.   She later called the office complaining of a diffuse, pruritic, erythematous rash that started during her admission and has worsened.  This was felt to be most likely related to contrast dye exposure. She was started on Zyrtec and a prendnisone taper. She also has a pressure ulcer on her back.   Today she presents to clinic for follow up.  No CP or SOB. No LE edema, orthopnea or PND. No dizziness or syncope. No blood in stool or urine. No palpitations. In much better spirits today and able to  tell a difference in her breathing. She said TAVR has given her a whole new life and named her valve, "Carol". She has been biking three times a day for 10 minutes at a time. Also trying to eat heart healthy.     Past Medical History:  Diagnosis Date  . Arthritis   . CAD (coronary artery disease)    a. s/p prior stenting; b. 05/2017 Cath ([facility]): LAD mild irregs, LCX 84m, OM2 40 ISR, RCA dominant, RPLV 50; c. 05/2019: s/p succesful orbital atherectomy and stenting of the ostial LCx, proximal OM1 and distal OM1 with DESx 3   . Carotid arterial disease ([place])    a. 02/2018 Carotid U/S: 1-39% bilat ICA stenosis. F/u prn.  . Carotid artery disease ([place])    a. s/p bilateral CEAs  . Chronic back pain   . Hyperlipidemia   . Hypertension   . Hypothyroidism   . Juxtafoveal telangietasis of both eyes   . PAD (peripheral artery disease) ([place])   . RBBB   . S/P TAVR (transcatheter aortic valve replacement)   . Severe aortic stenosis   . Sleep apnea    does not wear CPAP  . TIA (transient ischemic attack)     Past Surgical History:  Procedure Laterality Date  . BACK SURGERY  08/2016  . BREAST SURGERY     Biopsy  . CAROTID ENDARTERECTOMY Bilateral   . CHOLECYSTECTOMY    . CORONARY ANGIOGRAPHY N/A 06/05/2019   Procedure: CORONARY ANGIOGRAPHY (CATH LAB);  Surgeon: Burnell Blanks, MD;  Location: [facility];  Service: Cardiovascular;  Laterality: N/A;  . CORONARY ATHERECTOMY N/A 06/06/2019   Procedure: CORONARY ATHERECTOMY;  Surgeon: Martinique, Peter M, MD;  Location: [facility];  Service: Cardiovascular;  Laterality: N/A;  . RIGHT/LEFT HEART CATH AND CORONARY ANGIOGRAPHY Bilateral 05/23/2019   Procedure: RIGHT/LEFT HEART CATH AND CORONARY ANGIOGRAPHY;  Surgeon: Minna Merritts, MD;  Location: [facility];  Service: Cardiovascular;  Laterality: Bilateral;  . TEE WITHOUT CARDIOVERSION N/A 06/24/2019   Procedure: TRANSESOPHAGEAL ECHOCARDIOGRAM (TEE);   Surgeon: Burnell Blanks, MD;  Location: [facility];  Service: Open Heart Surgery;  Laterality: N/A;  . TONSILLECTOMY AND ADENOIDECTOMY      Current Medications: Outpatient Medications Prior to Visit  Medication Sig Dispense Refill  . amLODipine (NORVASC) 5 MG tablet Take 1 tablet (5 mg total) by mouth daily. 90 tablet 3  . aspirin 81 MG chewable tablet Chew 81 mg by mouth daily.    Marland Kitchen atorvastatin (LIPITOR) 80 MG tablet Take 1 tablet (80 mg total) by mouth daily at 6 PM. 90 tablet 2  . cephALEXin (KEFLEX) 500 MG capsule Take 4 pills (2,000 mg) one hour prior to all dental visits. 8 capsule 11  . clopidogrel (PLAVIX) 75 MG tablet Take 1 tablet (75 mg total) by mouth daily. 90 tablet 1  . furosemide (LASIX) 20 MG tablet Take 1 tablet (20 mg total) by mouth daily. 90 tablet 1  . isosorbide mononitrate (IMDUR) 30 MG 24 hr tablet Take 1 tablet (30  mg total) by mouth 2 (two) times daily. 180 tablet 2  . levothyroxine (SYNTHROID) 25 MCG tablet TAKE 1 TABLET BY MOUTH IN  THE MORNING ON AN EMPTY  STOMACH WITH WATER - NO  FOOD OR MEDICATIONS FOR 1/2 HOUR 90 tablet 0  . losartan (COZAAR) 100 MG tablet Take 1 tablet (100 mg total) by mouth daily. 90 tablet 2  . Misc Natural Products (FOCUSED MIND PO) Take 1 tablet by mouth daily.     . Multiple Vitamins-Minerals (ZINC PO) Take 1 tablet by mouth daily.     . nitroGLYCERIN (NITROSTAT) 0.3 MG SL tablet DISSOLVE 1 TABLET UNDER THE TONGUE EVERY 5 MINUTES AS  NEEDED FOR CHEST PAIN. MAX  OF 3 TABLETS IN 15 MINUTES. CALL 911 IF PAIN PERSISTS. 25 tablet 3  . Polyethyl Glycol-Propyl Glycol (SYSTANE) 0.4-0.3 % SOLN Place 1 drop into both eyes 2 (two) times daily as needed (Dry eye).    . potassium chloride (KLOR-CON) 10 MEQ tablet Take 1 tablet (10 mEq total) by mouth daily. 90 tablet 1  . VITAMIN D PO Take 1 tablet by mouth daily.      No facility-administered medications prior to visit.     Allergies:   Iodinated diagnostic agents, Codeine, and Penicillins     Social History   Socioeconomic History  . Marital status: Married    Spouse name: Not on file  . Number of children: 3  . Years of education: Not on file  . Highest education level: Not on file  Occupational History  . 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   Tobacco Use  . Smoking status: Former Smoker    Types: Cigarettes  . Smokeless tobacco: Never Used  Substance and Sexual Activity  . Alcohol use: No  . Drug use: No  . Sexual activity: Yes  Other Topics Concern  . Not on file  Social History Narrative  . Not on file   Social Determinants of Health   Financial Resource Strain:   . Difficulty of Paying Living Expenses: Not on file  Food Insecurity:   . Worried About Charity fundraiser in the Last Year: Not on file  . Ran Out of Food in the Last Year: Not on file  Transportation Needs:   . Lack of Transportation (Medical): Not on file  . Lack of Transportation (Non-Medical): Not on file  Physical Activity:   . Days of Exercise per Week: Not on file  . Minutes of Exercise per Session: Not on file  Stress:   . Feeling of Stress : Not on file  Social Connections:   . Frequency of Communication with Friends and Family: Not on file  . Frequency of Social Gatherings with Friends and Family: Not on file  . Attends Religious Services: Not on file  . Active Member of Clubs or Organizations: Not on file  . Attends Archivist Meetings: Not on file  . Marital Status: Not on file     Family History:  The patient's family history includes Alcohol abuse in her father; Arthritis in her sister; COPD in her father and sister; Cancer in her sister; Heart attack in her father and sister; Heart disease in her mother and sister; Hyperlipidemia in her mother; Hypertension in her mother, sister, and sister.     ROS:   Please see the history of present illness.    ROS All other systems reviewed and are negative.   PHYSICAL EXAM:   VS:  BP (!) 142/78  Pulse 73   Ht  5\' 1"  (1.549 m)   Wt 184 lb 12.8 oz (83.8 kg)   SpO2 98%   BMI 34.92 kg/m    GEN: Well nourished, well developed, in no acute distress, obese HEENT: normal Neck: no JVD or masses Cardiac: RRR; no murmurs, rubs, or gallops,no edema  Respiratory:  clear to auscultation bilaterally, normal work of breathing GI: soft, nontender, nondistended, + BS MS: no deformity or atrophy Skin: warm and dry, no rash.  Subclavian site is healing well with mild ecchymosis.  Groin site without hematoma or ecchymosis. Neuro:  Alert and Oriented x 3, Strength and sensation are intact Psych: euthymic mood, full affect.  Tearful in the office   Wt Readings from Last 3 Encounters:  07/17/19 184 lb 12.8 oz (83.8 kg)  07/02/19 189 lb 3.2 oz (85.8 kg)  06/26/19 132 lb (59.9 kg)      Studies/Labs Reviewed:   EKG:  EKG is NOT ordered today.    Recent Labs: 06/20/2019: ALT 20; B Natriuretic Peptide 123.8 06/25/2019: BUN 12; Creatinine, Ser 0.69; Hemoglobin 11.4; Magnesium 1.8; Platelets 191; Potassium 3.9; Sodium 139   Lipid Panel    Component Value Date/Time   CHOL 123 01/23/2018 1059   TRIG 90.0 01/23/2018 1059   HDL 48.10 01/23/2018 1059   CHOLHDL 3 01/23/2018 1059   VLDL 18.0 01/23/2018 1059   LDLCALC 57 01/23/2018 1059    Additional studies/ records that were reviewed today include:  TAVR OPERATIVE NOTE   Date of Procedure:06/24/2019  Preoperative Diagnosis:Severe Aortic Stenosis   Postoperative Diagnosis:Same   Procedure:   Transcatheter Aortic Valve Replacement -Left Subclavian ArteryApproach Medtronic Evolut Pro+(size 39mm, serial # [id number])  Co-Surgeons:Bryan Alveria Apley, MD and Lauree Chandler, MD   Anesthesiologist:Kevin Conrad [place], MD  Echocardiographer:Peter Johnsie Cancel, MD  Pre-operative Echo Findings: ? Severe aortic stenosisand moderate aortic  insufficiency ? Normal left ventricular systolic function  Post-operative Echo Findings: ? Noparavalvular leak ? Normal left ventricular systolic function  _____________    Echo 06/25/19 IMPRESSIONS 1. 26 mm Medtronic Evolut Pro Plus TAVR (06/24/2019). Vmax 1.8 m/s, mean gradient 6 mmHG, EOA 1.31 cm2. Normally functioning prosthetic valve without evidence of paravalvular leak. 2. Left ventricular ejection fraction, by visual estimation, is 60 to 65%. The left ventricle has normal function. There is no left ventricular hypertrophy. 3. Abnormal septal motion consistent with post-operative status. 4. Left ventricular diastolic parameters are consistent with Grade I diastolic dysfunction (impaired relaxation). 5. The left ventricle has no regional wall motion abnormalities. 6. Global right ventricle has normal systolic function.The right ventricular size is normal. No increase in right ventricular wall thickness. 7. Left atrial size was normal. 8. Right atrial size was normal. 9. Trivial pericardial effusion is present. 10. The mitral valve is grossly normal. Trivial mitral valve regurgitation. 11. The tricuspid valve is grossly normal. Tricuspid valve regurgitation is trivial. 12. Aortic valve regurgitation is not visualized. 13. The pulmonic valve was grossly normal. Pulmonic valve regurgitation is trivial. 14. Moderately elevated pulmonary artery systolic pressure. 15. The tricuspid regurgitant velocity is 3.46 m/s, and with an assumed right atrial pressure of 3 mmHg, the estimated right ventricular systolic pressure is moderately elevated at 51.0 mmHg. 16. A venous catheter is visualized in the IVC, RA and RV.  _______________  Echo 07/17/19 IMPRESSIONS    1. Left ventricular ejection fraction, by visual estimation, is 60 to 65%. The left ventricle has normal function. There is moderately increased left ventricular hypertrophy.  2. Elevated left ventricular  end-diastolic pressure.  3. Left ventricular diastolic parameters are consistent with Grade I diastolic dysfunction (impaired relaxation).  4. The left ventricle has no regional wall motion abnormalities.  5. Global right ventricle has normal systolic function.The right ventricular size is normal. No increase in right ventricular wall thickness.  6. Left atrial size was normal.  7. Right atrial size was normal.  8. The mitral valve is normal in structure. Trivial mitral valve regurgitation. No evidence of mitral stenosis.  9. The tricuspid valve is normal in structure. 10. Aortic valve mean gradient measures 5.5 mmHg. 11. Aortic valve peak gradient measures 11.4 mmHg. 12. Aortic valve regurgitation is not visualized. No evidence of aortic valve sclerosis or stenosis. 13. The pulmonic valve was normal in structure. Pulmonic valve regurgitation is not visualized. 14. The inferior vena cava is normal in size with greater than 50% respiratory variability, suggesting right atrial pressure of 3 mmHg. 15. S/P TAVR with a 26 mm supra annular Medtronic Evolut Pro valve. No PVL. Stable and normal transaortic gradients.  FINDINGS  Left Ventricle: Left ventricular ejection fraction, by visual estimation, is 60 to 65%. The left ventricle has normal function. The left ventricle has no regional wall motion abnormalities. There is moderately increased left ventricular hypertrophy.  Concentric left ventricular hypertrophy. Left ventricular diastolic parameters are consistent with Grade I diastolic dysfunction (impaired relaxation). Elevated left ventricular end-diastolic pressure.  Right Ventricle: The right ventricular size is normal. No increase in right ventricular wall thickness. Global RV systolic function is has normal systolic function.  Left Atrium: Left atrial size was normal in size.  Right Atrium: Right atrial size was normal in size  Pericardium: There is no evidence of pericardial  effusion.  Mitral Valve: The mitral valve is normal in structure. Trivial mitral valve regurgitation. No evidence of mitral valve stenosis by observation.  Tricuspid Valve: The tricuspid valve is normal in structure. Tricuspid valve regurgitation is not demonstrated.  Aortic Valve: The aortic valve has been repaired/replaced. Aortic valve regurgitation is not visualized. The aortic valve is structurally normal, with no evidence of sclerosis or stenosis. Aortic valve mean gradient measures 5.5 mmHg. Aortic valve peak  gradient measures 11.4 mmHg. Aortic valve area, by VTI measures 2.19 cm. 26 Medtronic Medtronic, stented aortic valve (TAVR) valve is present in the aortic position.   ASSESSMENT & PLAN:   Severe AS s/p TAVR: echo today shows EF 60-65%, normally functioning TAVR with a mean gradient of 5.5 mm Hg and no PVL. She has NYHA class I symptoms. SBE prophylaxis discussed; cephalexin was Rx'd due to a penicillin allergy. Continue ASA & Plavix x 1 year given multivessel stenting in the setting of ACS.   Transient CHB: wore a zio patch which is being mailed back to I rhythm.  No dizziness or syncope. Now off lopressor.   HTN: BP initially elevated but 128/85 on my personal recheck. Continue current regiemen  CAD: pre TAVR cath showed multivessel CAD with severe disease of the obtuse marginal branch involving proximal edge of previously placed stent. S/p successful orbital atherectomy and stenting of the ostial LCx, proximal OM1 and distal OM1 with DES x3 on 06/06/19. Continue Aspirin and plavix x 1 year and statin.  Lopressor discontinued given conduction disease and CHB after TAVR. This may be able to be added back at a later time.   PAD: pre TAVR cath showed severe peripheral arterial disease with 80% ostial stenosis of the right common iliac artery and occluded right superficial femoral artery. Continue medical  therapy with antiplatelets and statin.   Depression: she is in much  better spirits today and feeling much better.  Medication Adjustments/Labs and Tests Ordered: Current medicines are reviewed at length with the patient today.  Concerns regarding medicines are outlined above.  Medication changes, Labs and Tests ordered today are listed in the Patient Instructions below. Patient Instructions  Medication Instructions:  Your provider recommends that you continue on your current medications as directed. Please refer to the Current Medication list given to you today.    Follow-Up: Your provider recommends that you schedule a follow-up appointment in: 3 months with Dr. Saunders Revel in [place].  We will call you to schedule your 1 year TAVR echo and office visit.    Signed, Angelena Form, PA-C  07/29/2019 1:08 AM    [facility] [address], [place], [place]  [address] Phone: [phone]; Fax: [phone]

## 2019-07-17 ENCOUNTER — Ambulatory Visit: Payer: Medicare Other | Admitting: Physician Assistant

## 2019-07-17 ENCOUNTER — Other Ambulatory Visit: Payer: Self-pay

## 2019-07-17 ENCOUNTER — Encounter: Payer: Self-pay | Admitting: Physician Assistant

## 2019-07-17 ENCOUNTER — Ambulatory Visit (HOSPITAL_COMMUNITY): Payer: Medicare Other | Attending: Cardiovascular Disease

## 2019-07-17 VITALS — BP 142/78 | HR 73 | Ht 61.0 in | Wt 184.8 lb

## 2019-07-17 DIAGNOSIS — I442 Atrioventricular block, complete: Secondary | ICD-10-CM

## 2019-07-17 DIAGNOSIS — Z952 Presence of prosthetic heart valve: Secondary | ICD-10-CM | POA: Insufficient documentation

## 2019-07-17 DIAGNOSIS — I739 Peripheral vascular disease, unspecified: Secondary | ICD-10-CM

## 2019-07-17 DIAGNOSIS — F329 Major depressive disorder, single episode, unspecified: Secondary | ICD-10-CM

## 2019-07-17 DIAGNOSIS — I25118 Atherosclerotic heart disease of native coronary artery with other forms of angina pectoris: Secondary | ICD-10-CM

## 2019-07-17 DIAGNOSIS — I10 Essential (primary) hypertension: Secondary | ICD-10-CM | POA: Diagnosis not present

## 2019-07-17 DIAGNOSIS — F32A Depression, unspecified: Secondary | ICD-10-CM

## 2019-07-17 NOTE — Patient Instructions (Addendum)
Medication Instructions:  Your provider recommends that you continue on your current medications as directed. Please refer to the Current Medication list given to you today.    Follow-Up: Your provider recommends that you schedule a follow-up appointment in: 3 months with Dr. Saunders Revel in [place].  We will call you to schedule your 1 year TAVR echo and office visit.

## 2019-07-19 ENCOUNTER — Other Ambulatory Visit: Payer: Self-pay | Admitting: Physician Assistant

## 2019-08-01 ENCOUNTER — Observation Stay
Admission: EM | Admit: 2019-08-01 | Discharge: 2019-08-02 | Disposition: A | Discharge status: Home / Self Care | Payer: Medicare Other | Attending: Family Medicine | Admitting: Family Medicine

## 2019-08-01 ENCOUNTER — Encounter: Payer: Self-pay | Admitting: Emergency Medicine

## 2019-08-01 ENCOUNTER — Emergency Department: Payer: Medicare Other

## 2019-08-01 ENCOUNTER — Other Ambulatory Visit: Payer: Self-pay

## 2019-08-01 ENCOUNTER — Observation Stay (HOSPITAL_BASED_OUTPATIENT_CLINIC_OR_DEPARTMENT_OTHER)
Admit: 2019-08-01 | Discharge: 2019-08-01 | Disposition: A | Discharge status: Home / Self Care | Payer: Medicare Other | Attending: Internal Medicine | Admitting: Internal Medicine

## 2019-08-01 DIAGNOSIS — I4891 Unspecified atrial fibrillation: Secondary | ICD-10-CM

## 2019-08-01 DIAGNOSIS — R0602 Shortness of breath: Secondary | ICD-10-CM | POA: Diagnosis not present

## 2019-08-01 DIAGNOSIS — Z8673 Personal history of transient ischemic attack (TIA), and cerebral infarction without residual deficits: Secondary | ICD-10-CM | POA: Insufficient documentation

## 2019-08-01 DIAGNOSIS — Z885 Allergy status to narcotic agent status: Secondary | ICD-10-CM | POA: Insufficient documentation

## 2019-08-01 DIAGNOSIS — Z7901 Long term (current) use of anticoagulants: Secondary | ICD-10-CM | POA: Diagnosis not present

## 2019-08-01 DIAGNOSIS — Z9049 Acquired absence of other specified parts of digestive tract: Secondary | ICD-10-CM | POA: Diagnosis not present

## 2019-08-01 DIAGNOSIS — R079 Chest pain, unspecified: Secondary | ICD-10-CM | POA: Diagnosis not present

## 2019-08-01 DIAGNOSIS — Z952 Presence of prosthetic heart valve: Secondary | ICD-10-CM | POA: Diagnosis not present

## 2019-08-01 DIAGNOSIS — E039 Hypothyroidism, unspecified: Secondary | ICD-10-CM | POA: Diagnosis not present

## 2019-08-01 DIAGNOSIS — I471 Supraventricular tachycardia: Secondary | ICD-10-CM | POA: Insufficient documentation

## 2019-08-01 DIAGNOSIS — I10 Essential (primary) hypertension: Secondary | ICD-10-CM | POA: Diagnosis not present

## 2019-08-01 DIAGNOSIS — I480 Paroxysmal atrial fibrillation: Principal | ICD-10-CM | POA: Insufficient documentation

## 2019-08-01 DIAGNOSIS — I080 Rheumatic disorders of both mitral and aortic valves: Secondary | ICD-10-CM | POA: Insufficient documentation

## 2019-08-01 DIAGNOSIS — Z809 Family history of malignant neoplasm, unspecified: Secondary | ICD-10-CM | POA: Insufficient documentation

## 2019-08-01 DIAGNOSIS — Z87891 Personal history of nicotine dependence: Secondary | ICD-10-CM | POA: Insufficient documentation

## 2019-08-01 DIAGNOSIS — R778 Other specified abnormalities of plasma proteins: Secondary | ICD-10-CM

## 2019-08-01 DIAGNOSIS — Z825 Family history of asthma and other chronic lower respiratory diseases: Secondary | ICD-10-CM | POA: Insufficient documentation

## 2019-08-01 DIAGNOSIS — Z79899 Other long term (current) drug therapy: Secondary | ICD-10-CM | POA: Insufficient documentation

## 2019-08-01 DIAGNOSIS — Z8261 Family history of arthritis: Secondary | ICD-10-CM | POA: Insufficient documentation

## 2019-08-01 DIAGNOSIS — I442 Atrioventricular block, complete: Secondary | ICD-10-CM | POA: Diagnosis not present

## 2019-08-01 DIAGNOSIS — G4730 Sleep apnea, unspecified: Secondary | ICD-10-CM | POA: Insufficient documentation

## 2019-08-01 DIAGNOSIS — Z8249 Family history of ischemic heart disease and other diseases of the circulatory system: Secondary | ICD-10-CM | POA: Insufficient documentation

## 2019-08-01 DIAGNOSIS — Z8349 Family history of other endocrine, nutritional and metabolic diseases: Secondary | ICD-10-CM | POA: Insufficient documentation

## 2019-08-01 DIAGNOSIS — Z91041 Radiographic dye allergy status: Secondary | ICD-10-CM | POA: Insufficient documentation

## 2019-08-01 DIAGNOSIS — I739 Peripheral vascular disease, unspecified: Secondary | ICD-10-CM

## 2019-08-01 DIAGNOSIS — Z7902 Long term (current) use of antithrombotics/antiplatelets: Secondary | ICD-10-CM | POA: Insufficient documentation

## 2019-08-01 DIAGNOSIS — Z20822 Contact with and (suspected) exposure to covid-19: Secondary | ICD-10-CM | POA: Insufficient documentation

## 2019-08-01 DIAGNOSIS — Z7982 Long term (current) use of aspirin: Secondary | ICD-10-CM | POA: Insufficient documentation

## 2019-08-01 DIAGNOSIS — Z811 Family history of alcohol abuse and dependence: Secondary | ICD-10-CM | POA: Insufficient documentation

## 2019-08-01 DIAGNOSIS — I25118 Atherosclerotic heart disease of native coronary artery with other forms of angina pectoris: Secondary | ICD-10-CM | POA: Diagnosis not present

## 2019-08-01 DIAGNOSIS — I6523 Occlusion and stenosis of bilateral carotid arteries: Secondary | ICD-10-CM | POA: Insufficient documentation

## 2019-08-01 DIAGNOSIS — I2510 Atherosclerotic heart disease of native coronary artery without angina pectoris: Secondary | ICD-10-CM | POA: Insufficient documentation

## 2019-08-01 DIAGNOSIS — M1990 Unspecified osteoarthritis, unspecified site: Secondary | ICD-10-CM | POA: Insufficient documentation

## 2019-08-01 DIAGNOSIS — I119 Hypertensive heart disease without heart failure: Secondary | ICD-10-CM | POA: Diagnosis not present

## 2019-08-01 DIAGNOSIS — R0789 Other chest pain: Secondary | ICD-10-CM | POA: Diagnosis not present

## 2019-08-01 DIAGNOSIS — I340 Nonrheumatic mitral (valve) insufficiency: Secondary | ICD-10-CM

## 2019-08-01 DIAGNOSIS — E785 Hyperlipidemia, unspecified: Secondary | ICD-10-CM | POA: Diagnosis not present

## 2019-08-01 DIAGNOSIS — Z880 Allergy status to penicillin: Secondary | ICD-10-CM | POA: Insufficient documentation

## 2019-08-01 DIAGNOSIS — Z955 Presence of coronary angioplasty implant and graft: Secondary | ICD-10-CM | POA: Insufficient documentation

## 2019-08-01 LAB — PROTIME-INR
INR: 1 (ref 0.8–1.2)
Prothrombin Time: 12.8 seconds (ref 11.4–15.2)

## 2019-08-01 LAB — COMPREHENSIVE METABOLIC PANEL
ALT: 26 U/L (ref 0–44)
AST: 28 U/L (ref 15–41)
Albumin: 4.7 g/dL (ref 3.5–5.0)
Alkaline Phosphatase: 62 U/L (ref 38–126)
Anion gap: 7 (ref 5–15)
BUN: 15 mg/dL (ref 8–23)
CO2: 28 mmol/L (ref 22–32)
Calcium: 9.7 mg/dL (ref 8.9–10.3)
Chloride: 105 mmol/L (ref 98–111)
Creatinine, Ser: 0.77 mg/dL (ref 0.44–1.00)
GFR calc Af Amer: >60 mL/min (ref >60)
GFR calc non Af Amer: >60 mL/min (ref >60)
Glucose, Bld: 120 mg/dL — ABNORMAL HIGH (ref 70–99)
Potassium: 3.7 mmol/L (ref 3.5–5.1)
Sodium: 140 mmol/L (ref 135–145)
Total Bilirubin: 0.7 mg/dL (ref 0.3–1.2)
Total Protein: 7.9 g/dL (ref 6.5–8.1)

## 2019-08-01 LAB — CBC WITH DIFFERENTIAL/PLATELET
Abs Immature Granulocytes: 0.01 10*3/uL (ref 0.00–0.07)
Basophils Absolute: 0 10*3/uL (ref 0.0–0.1)
Basophils Relative: 1 %
Eosinophils Absolute: 0.2 10*3/uL (ref 0.0–0.5)
Eosinophils Relative: 3 %
HCT: 44.3 % (ref 36.0–46.0)
Hemoglobin: 14.6 g/dL (ref 12.0–15.0)
Immature Granulocytes: 0 %
Lymphocytes Relative: 40 %
Lymphs Abs: 2.4 10*3/uL (ref 0.7–4.0)
MCH: 32.7 pg (ref 26.0–34.0)
MCHC: 33 g/dL (ref 30.0–36.0)
MCV: 99.1 fL (ref 80.0–100.0)
Monocytes Absolute: 0.7 10*3/uL (ref 0.1–1.0)
Monocytes Relative: 11 %
Neutro Abs: 2.7 10*3/uL (ref 1.7–7.7)
Neutrophils Relative %: 45 %
Platelets: 268 10*3/uL (ref 150–400)
RBC: 4.47 MIL/uL (ref 3.87–5.11)
RDW: 14.6 % (ref 11.5–15.5)
WBC: 6 10*3/uL (ref 4.0–10.5)
nRBC: 0 % (ref 0.0–0.2)

## 2019-08-01 LAB — RESPIRATORY PANEL BY RT PCR (FLU A&B, COVID)
Influenza A by PCR: NEGATIVE
Influenza B by PCR: NEGATIVE
SARS Coronavirus 2 by RT PCR: NEGATIVE

## 2019-08-01 LAB — TSH: TSH: 1.563 u[IU]/mL (ref 0.350–4.500)

## 2019-08-01 LAB — TROPONIN I (HIGH SENSITIVITY)
Troponin I (High Sensitivity): 11 ng/L (ref <18)
Troponin I (High Sensitivity): 22 ng/L — ABNORMAL HIGH (ref <18)

## 2019-08-01 LAB — BRAIN NATRIURETIC PEPTIDE: B Natriuretic Peptide: 101 pg/mL — ABNORMAL HIGH (ref 0.0–100.0)

## 2019-08-01 LAB — ECHOCARDIOGRAM COMPLETE: Weight: 2955.93 oz

## 2019-08-01 LAB — APTT: aPTT: 28 seconds (ref 24–36)

## 2019-08-01 MED ORDER — LEVOTHYROXINE SODIUM 25 MCG PO TABS
25.0000 ug | ORAL_TABLET | Freq: Every day | ORAL | Status: DC
  Administered 2019-08-02: 25 ug via ORAL
  Filled 2019-08-01: qty 1

## 2019-08-01 MED ORDER — DILTIAZEM HCL 25 MG/5ML IV SOLN
10.0000 mg | Freq: Once | INTRAVENOUS | Status: AC
  Administered 2019-08-01: 05:00:00 10 mg via INTRAVENOUS

## 2019-08-01 MED ORDER — NITROGLYCERIN 2 % TD OINT
1.0000 [in_us] | TOPICAL_OINTMENT | Freq: Once | TRANSDERMAL | Status: AC
  Administered 2019-08-01: 1 [in_us] via TOPICAL

## 2019-08-01 MED ORDER — HEPARIN BOLUS VIA INFUSION
3800.0000 [IU] | Freq: Once | INTRAVENOUS | Status: AC
  Administered 2019-08-01: 3800 [IU] via INTRAVENOUS
  Filled 2019-08-01: qty 3800

## 2019-08-01 MED ORDER — HEPARIN (PORCINE) 25000 UT/250ML-% IV SOLN
INTRAVENOUS | Status: AC
  Filled 2019-08-01: qty 250

## 2019-08-01 MED ORDER — ISOSORBIDE MONONITRATE ER 30 MG PO TB24
30.0000 mg | ORAL_TABLET | Freq: Two times a day (BID) | ORAL | Status: DC
  Administered 2019-08-01 – 2019-08-02 (×2): 30 mg via ORAL
  Filled 2019-08-01 (×2): qty 1

## 2019-08-01 MED ORDER — ONDANSETRON HCL 4 MG/2ML IJ SOLN: 4.0000 mg | Freq: Four times a day (QID) | INTRAMUSCULAR | Status: DC | PRN

## 2019-08-01 MED ORDER — HEPARIN SODIUM (PORCINE) 5000 UNIT/ML IJ SOLN: 60.0000 [IU]/kg | Freq: Once | INTRAMUSCULAR | Status: DC

## 2019-08-01 MED ORDER — ONDANSETRON HCL 4 MG PO TABS: 4.0000 mg | ORAL_TABLET | Freq: Four times a day (QID) | ORAL | Status: DC | PRN

## 2019-08-01 MED ORDER — CLOPIDOGREL BISULFATE 75 MG PO TABS
75.0000 mg | ORAL_TABLET | Freq: Every day | ORAL | Status: DC
  Administered 2019-08-02: 75 mg via ORAL
  Filled 2019-08-01: qty 1

## 2019-08-01 MED ORDER — DILTIAZEM HCL-DEXTROSE 125-5 MG/125ML-% IV SOLN (PREMIX)
5.0000 mg/h | INTRAVENOUS | Status: DC
  Administered 2019-08-01: 5 mg/h via INTRAVENOUS
  Filled 2019-08-01: qty 125

## 2019-08-01 MED ORDER — HEPARIN (PORCINE) 25000 UT/250ML-% IV SOLN
900.0000 [IU]/h | INTRAVENOUS | Status: DC
  Administered 2019-08-01: 900 [IU]/h via INTRAVENOUS
  Filled 2019-08-01: qty 250

## 2019-08-01 MED ORDER — ACETAMINOPHEN 325 MG PO TABS: 650.0000 mg | ORAL_TABLET | Freq: Four times a day (QID) | ORAL | Status: DC | PRN

## 2019-08-01 MED ORDER — DILTIAZEM HCL 25 MG/5ML IV SOLN
10.0000 mg | Freq: Once | INTRAVENOUS | Status: AC
  Administered 2019-08-01: 10 mg via INTRAVENOUS
  Filled 2019-08-01: qty 5

## 2019-08-01 MED ORDER — ACETAMINOPHEN 650 MG RE SUPP: 650.0000 mg | Freq: Four times a day (QID) | RECTAL | Status: DC | PRN

## 2019-08-01 MED ORDER — ONDANSETRON HCL 4 MG/2ML IJ SOLN
4.0000 mg | Freq: Once | INTRAMUSCULAR | Status: AC
  Administered 2019-08-01: 4 mg via INTRAVENOUS
  Filled 2019-08-01: qty 2

## 2019-08-01 MED ORDER — HEPARIN (PORCINE) 25000 UT/250ML-% IV SOLN: 14.0000 [IU]/kg/h | INTRAVENOUS | Status: DC

## 2019-08-01 MED ORDER — DILTIAZEM HCL 25 MG/5ML IV SOLN
INTRAVENOUS | Status: AC
  Filled 2019-08-01: qty 5

## 2019-08-01 MED ORDER — AMIODARONE HCL 200 MG PO TABS
200.0000 mg | ORAL_TABLET | Freq: Every day | ORAL | Status: DC
  Administered 2019-08-01: 200 mg via ORAL
  Filled 2019-08-01: qty 1

## 2019-08-01 MED ORDER — ATORVASTATIN CALCIUM 80 MG PO TABS: 80.0000 mg | ORAL_TABLET | Freq: Every day | ORAL | Status: DC

## 2019-08-01 MED ORDER — APIXABAN 5 MG PO TABS
5.0000 mg | ORAL_TABLET | Freq: Two times a day (BID) | ORAL | Status: DC
  Administered 2019-08-01 – 2019-08-02 (×3): 5 mg via ORAL
  Filled 2019-08-01 (×3): qty 1

## 2019-08-01 NOTE — ED Notes (Signed)
Pt given meal tray at this time 

## 2019-08-01 NOTE — ED Notes (Signed)
Admitting MD at bedside at this time.

## 2019-08-01 NOTE — ED Notes (Signed)
EKG indicates afib AVR; Pt taken immed to room 3 for further eval; charge nurse notified

## 2019-08-01 NOTE — Progress Notes (Signed)
Pt arrived to 2A, oriented to room, expressed no needs at this time

## 2019-08-01 NOTE — Consult Note (Signed)
Cardiology Consult    Patient ID: Victoria Burns MRN: [id number], DOB/AGE: [birth year]   Admit date: 08/01/2019 Date of Consult: 08/01/2019  Primary Physician: Pleas Koch, NP Primary Cardiologist: Nelva Bush, MD Requesting Provider: C. Cruzita Lederer, MD  Patient Profile    Victoria Burns is a 79 y.o. female with a history of CAD status post prior circumflex and obtuse marginal stenting, severe aortic stenosis status post TAVR, carotid arterial disease status post prior bilateral endarterectomies, hypertension, hyperlipidemia, TIA, hypothyroidism, and sleep apnea who is being seen today for the evaluation of chest pain at the request of Dr. Cruzita Lederer.  Past Medical History   Past Medical History:  Diagnosis Date  . Arthritis   . CAD (coronary artery disease)    a. s/p remote OM stenting; b. 05/2017 Cath ([facility] [place]): LAD mild irregs, LCX 3m, OM2 40 ISR, RCA dominant, RPLV 50; c. 05/2019 Cath: LM nl, LAD mild-mod diff dzs, LCX 70, OM1 70-80p, 39m, RCA mod diff dzs; c. 05/2019 PCI/orbital atherectomy: OM1 60/80 (2.5x18 & 2.75x12 Resolute Onyx DES), LCX 75ost (3.5x12 Resolute Onyx DES).  . Carotid artery disease ([place])    a. s/p bilateral CEAs; b. 02/2018 Carotid U/S: 1-39% bilat ICA stenosis. F/u prn.  . Chronic back pain   . Hyperlipidemia   . Hypertension   . Hypothyroidism   . Juxtafoveal telangietasis of both eyes   . PAD (peripheral artery disease) ([place])    a. 05/2019 Cath: found to have 80% ost RCIA stenosis and 100 RSFA.  Marland Kitchen RBBB   . S/P TAVR (transcatheter aortic valve replacement)   . Severe aortic stenosis    a. 06/2019 TAVR: 36mm MDT Evolut Pro Plus TAVR; b. 06/2019 Echo: EF 60-65%, mod LVH. Gr1 DD. No rwma. AoV mean/peak grad 5.5/11.72mmHg. No AS.  Marland Kitchen Sleep apnea    does not wear CPAP  . TIA (transient ischemic attack)     Past Surgical History:  Procedure Laterality Date  . BACK SURGERY  08/2016  . BREAST SURGERY     Biopsy  . CAROTID ENDARTERECTOMY  Bilateral   . CHOLECYSTECTOMY    . CORONARY ANGIOGRAPHY N/A 06/05/2019   Procedure: CORONARY ANGIOGRAPHY (CATH LAB);  Surgeon: Burnell Blanks, MD;  Location: [facility];  Service: Cardiovascular;  Laterality: N/A;  . CORONARY ATHERECTOMY N/A 06/06/2019   Procedure: CORONARY ATHERECTOMY;  Surgeon: Martinique, Peter M, MD;  Location: [facility];  Service: Cardiovascular;  Laterality: N/A;  . RIGHT/LEFT HEART CATH AND CORONARY ANGIOGRAPHY Bilateral 05/23/2019   Procedure: RIGHT/LEFT HEART CATH AND CORONARY ANGIOGRAPHY;  Surgeon: Minna Merritts, MD;  Location: [facility];  Service: Cardiovascular;  Laterality: Bilateral;  . TEE WITHOUT CARDIOVERSION N/A 06/24/2019   Procedure: TRANSESOPHAGEAL ECHOCARDIOGRAM (TEE);  Surgeon: Burnell Blanks, MD;  Location: [facility];  Service: Open Heart Surgery;  Laterality: N/A;  . TONSILLECTOMY AND ADENOIDECTOMY       Allergies  Allergies  Allergen Reactions  . Iodinated Diagnostic Agents     Hives all over body.   . Codeine Rash  . Penicillins Rash    Did it involve swelling of the face/tongue/throat, SOB, or low BP? No Did it involve sudden or severe rash/hives, skin peeling, or any reaction on the inside of your mouth or nose? No Did you need to seek medical attention at a hospital or doctor's office? No When did it last happen?over 50 years ago If all above answers are "NO", may proceed with cephalosporin  use.    History of Present Illness    79 year old female with the above complex past medical history including CAD status post prior stenting, hypertension, hyperlipidemia, severe aortic stenosis, right bundle branch block, TIA, arthritis, carotid arterial disease status post bilateral endarterectomies, sleep apnea, and hypothyroidism.  She previously underwent obtuse marginal stenting with subsequent catheterization in November 2018 in [place] revealing moderate nonobstructive disease.  She has some  degree of chronic stable angina in late 2019 which was managed with nitrate therapy.  Though she was stable on telemedicine visit in the spring 2020, at her November 2020 visit, she reported worsening chest pain and dyspnea.  Echo done prior to that visit showed worsening of aortic stenosis as well.  She subsequently underwent right and left heart cardiac catheterization revealing severe first obtuse marginal and circumflex disease and she subsequently underwent staged PCI/orbital atherectomy and drug-eluting stent placement x2 to the OM1 and x1 to the circumflex on November 20.  She continued to have chest pain and dyspnea following stenting and in the setting of severe aortic stenosis, she was also evaluated by our structural heart team and felt to be a suitable candidate for transcatheter aortic valve replacement.  This was performed successfully on June 24, 2019.  She did develop complete heart block in the setting of pre-existing right bundle branch block following TAVR and requiring temporary pacing for 24 hours.  Beta-blocker was discontinued and she was finally discharged on November 11 with a ZIO monitor, which did not show any evidence of high-grade heart block.  She was noted to have brief runs of SVT.  Following her TAVR, she says that she did exceptionally well.  She has been riding an exercise bike regularly without symptoms or limitations.  She is also been better about eating a heart healthy diet.  Follow-up echocardiogram in late December showed normal functioning prosthetic valve with a mean gradient of 5.5 mmHg (peak gradient 11.4 mmHg).  She was in her usual state of health until early this morning when she awoke suddenly at approximately 1:30 AM with diaphoresis and retrosternal chest discomfort.  She did note tachypalpitations.  She eventually awakened her husband and he drove her into the emergency department.  Upon arrival, she was found to be atrial fibrillation with rapid ventricular  response at a rate of 149 bpm.  She was normotensive.  She replaced on diltiazem infusion and then within about 3 hours, she converted to sinus rhythm.  She is currently feeling back to baseline and is without chest pain or dyspnea.  In the setting of A. fib with RVR, high-sensitivity troponin did rise from 11-22.  Lab work is otherwise unremarkable.  Home Medications      Prior to Admission medications   Medication Sig Start Date End Date Taking? Authorizing Provider  amLODipine (NORVASC) 5 MG tablet Take 1 tablet (5 mg total) by mouth daily. 07/14/19 07/13/20 Yes Eileen Stanford, PA-C  aspirin 81 MG chewable tablet Chew 81 mg by mouth daily.   Yes [provider]  atorvastatin (LIPITOR) 80 MG tablet Take 1 tablet (80 mg total) by mouth daily at 6 PM. 05/19/19  Yes Gollan, Kathlene November, MD  cloNIDine (CATAPRES) 0.1 MG tablet Take 1 tablet by mouth 2 (two) times daily. 07/24/19  Yes [provider]  clopidogrel (PLAVIX) 75 MG tablet Take 1 tablet (75 mg total) by mouth daily. 06/19/19 06/13/20 Yes Gollan, Kathlene November, MD  furosemide (LASIX) 20 MG tablet Take 1 tablet (20 mg  total) by mouth daily. 06/19/19 09/17/19 Yes Gollan, Kathlene November, MD  isosorbide mononitrate (IMDUR) 30 MG 24 hr tablet Take 1 tablet (30 mg total) by mouth 2 (two) times daily. 05/19/19  Yes Gollan, Kathlene November, MD  levothyroxine (SYNTHROID) 25 MCG tablet TAKE 1 TABLET BY MOUTH IN  THE MORNING ON AN EMPTY  STOMACH WITH WATER - NO  FOOD OR MEDICATIONS FOR 1/2 HOUR 06/23/19  Yes Pleas Koch, NP  losartan (COZAAR) 100 MG tablet Take 1 tablet (100 mg total) by mouth daily. 05/19/19  Yes Gollan, Kathlene November, MD  Misc Natural Products (FOCUSED MIND PO) Take 1 tablet by mouth daily.    Yes [provider]  Multiple Vitamins-Minerals (ZINC PO) Take 1 tablet by mouth daily.    Yes [provider]  nitroGLYCERIN (NITROSTAT) 0.3 MG SL tablet DISSOLVE 1 TABLET UNDER THE TONGUE EVERY 5 MINUTES AS  NEEDED FOR CHEST  PAIN. MAX  OF 3 TABLETS IN 15 MINUTES. CALL 911 IF PAIN PERSISTS. 07/07/19  Yes Gollan, Kathlene November, MD  Polyethyl Glycol-Propyl Glycol (SYSTANE) 0.4-0.3 % SOLN Place 1 drop into both eyes 2 (two) times daily as needed (Dry eye).   Yes [provider]  potassium chloride (KLOR-CON) 10 MEQ tablet Take 1 tablet (10 mEq total) by mouth daily. 06/19/19 09/17/19 Yes Gollan, Kathlene November, MD  VITAMIN D PO Take 1 tablet by mouth daily.    Yes [provider]      Family History    Family History  Problem Relation Age of Onset  . Heart disease Mother   . Hyperlipidemia Mother   . Hypertension Mother   . Alcohol abuse Father   . COPD Father   . Heart attack Father   . Arthritis Sister   . Hypertension Sister   . Cancer Sister   . COPD Sister   . Heart attack Sister   . Heart disease Sister   . Hypertension Sister    She indicated that her mother is deceased. She indicated that her father is deceased. She indicated that two of her three sisters are alive.   Social History    Social History   Socioeconomic History  . Marital status: Married    Spouse name: Not on file  . Number of children: 3  . Years of education: Not on file  . Highest education level: Not on file  Occupational History  . 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   Tobacco Use  . Smoking status: Former Smoker    Types: Cigarettes  . Smokeless tobacco: Never Used  Substance and Sexual Activity  . Alcohol use: No  . Drug use: No  . Sexual activity: Yes  Other Topics Concern  . Not on file  Social History Narrative  . Not on file   Social Determinants of Health   Financial Resource Strain:   . Difficulty of Paying Living Expenses: Not on file  Food Insecurity:   . Worried About Charity fundraiser in the Last Year: Not on file  . Ran Out of Food in the Last Year: Not on file  Transportation Needs:   . Lack of Transportation (Medical): Not on file  . Lack of Transportation (Non-Medical): Not  on file  Physical Activity:   . Days of Exercise per Week: Not on file  . Minutes of Exercise per Session: Not on file  Stress:   . Feeling of Stress : Not on file  Social Connections:   . Frequency of  Communication with Friends and Family: Not on file  . Frequency of Social Gatherings with Friends and Family: Not on file  . Attends Religious Services: Not on file  . Active Member of Clubs or Organizations: Not on file  . Attends Archivist Meetings: Not on file  . Marital Status: Not on file  Intimate Partner Violence:   . Fear of Current or Ex-Partner: Not on file  . Emotionally Abused: Not on file  . Physically Abused: Not on file  . Sexually Abused: Not on file     Review of Systems    General:  No chills, fever, night sweats or weight changes.  Cardiovascular:  +++ chest pain, +++ dyspnea, no edema, orthopnea, +++ palpitations, no paroxysmal nocturnal dyspnea. Dermatological: No rash, lesions/masses Respiratory: No cough, +++ dyspnea in setting of rapid heart rates. Urologic: No hematuria, dysuria Abdominal:   No nausea, vomiting, diarrhea, bright red blood per rectum, melena, or hematemesis Neurologic:  No visual changes, wkns, changes in mental status. All other systems reviewed and are otherwise negative except as noted above.  Physical Exam    Blood pressure 124/86, pulse 63, temperature 98 F (36.7 C), temperature source Oral, resp. rate (!) 22, weight 83.8 kg, SpO2 95 %.  General: Pleasant, NAD Psych: Normal affect. Neuro: Alert and oriented X 3. Moves all extremities spontaneously. HEENT: Normal  Neck: Supple without bruits or JVD. Lungs:  Resp regular and unlabored, CTA. Heart: RRR no s3, s4, or murmurs. Abdomen: Soft, non-tender, non-distended, BS + x 4.  Extremities: No clubbing, cyanosis or edema. DP/PT/Radials 1+ and equal bilaterally.  Labs    Cardiac Enzymes Recent Labs  Lab 08/01/19 0346 08/01/19 0542  TROPONINIHS 11 22*      Lab  Results  Component Value Date   WBC 6.0 08/01/2019   HGB 14.6 08/01/2019   HCT 44.3 08/01/2019   MCV 99.1 08/01/2019   PLT 268 08/01/2019    Recent Labs  Lab 08/01/19 0346  NA 140  K 3.7  CL 105  CO2 28  BUN 15  CREATININE 0.77  CALCIUM 9.7  PROT 7.9  BILITOT 0.7  ALKPHOS 62  ALT 26  AST 28  GLUCOSE 120*   Lab Results  Component Value Date   CHOL 123 01/23/2018   HDL 48.10 01/23/2018   LDLCALC 57 01/23/2018   TRIG 90.0 01/23/2018    Radiology Studies    DG Chest Port 1 View  Result Date: 08/01/2019 CLINICAL DATA:  Chest pain EXAM: PORTABLE CHEST 1 VIEW COMPARISON:  06/24/2019 FINDINGS: Transcatheter aortic valve replacement. Stable mild cardiac enlargement. There is no edema, consolidation, effusion, or pneumothorax. No osseous findings. IMPRESSION: No evidence of active disease. Electronically Signed   By: Monte Fantasia M.D.   On: 08/01/2019 04:06   ECG & Cardiac Imaging    On presentation-A. fib with RVR, 149, left axis deviation, right bundle branch block with associated T changes.  Following conversion to sinus rhythm just after 630 this morning, ECG showed sinus rhythm, 65, right bundle branch block, no acute ST or T changes.  Both ECGs have been personally reviewed.  Assessment & Plan    1.  Atrial fibrillation with rapid ventricular response: Patient with a prior history of palpitations and brief runs of SVT noted on event monitoring in the past but no prior history of A. fib.  She is recently status post TAVR in December with postoperative course complicated by heart block requiring 24 hours of temporary pacing.  Subsequent Zio monitoring revealed neither significant bradycardia/heart block nor A. fib, though brief runs of SVT were again appreciated.  Though she has been doing well post TAVR, this morning, she awoke at 130 with diaphoresis, chest pain, and palpitations.  She eventually presented to the emergency department where she was found to be in A. fib with  RVR with rates in the upper 140s.  After approximately 2 to 3 hours of IV diltiazem, she converted back to sinus rhythm and is currently asymptomatic.  Given her history of coronary and structural heart disease, her risk of recurrent atrial fibrillation is quite high.  Beta-blocker therapy was previously discontinued out of concern for bradycardia and heart block at the time of her TAVR.  I suspect she will benefit most from antiarrhythmic therapy and will d/w Dr. Rockey Situ.  CHA2DS2-VASc of 5-7 (? H/o TIA) and thus we will plan to initiate Eliquis therapy-5 mg twice daily.  She is currently on aspirin and Plavix in the setting of drug-eluting stent placement x3 in November.  We will plan to discontinue aspirin.  2.  Coronary artery disease: Status post recent circumflex and OM1 stenting in November 2020.  She has been doing well without recurrent chest pain until this morning, in the setting of rapid rates/A. fib.  Troponin is minimally elevated and presentation is more likely consistent with demand ischemia in the setting of A. fib.  She is currently chest pain-free.  As above, she has been on aspirin and Plavix in the setting of recent PCI but as she now requires oral anticoagulation, we will plan to discontinue aspirin and initiate Eliquis.  Continue beta-blocker, nitrate, and ARB therapy.  3.  Essential HTN:  Stable.  4.  HL:  LDL 57 in 01/2018 - will need outpt f/u.  Cont statin.  5.  PAD: Occluded right SFA and severe disease in the right common iliac noted at the time of catheterization in November 2020.  No claudication.  She is currently being medically managed.  Also has known mild carotid disease status post bilateral endarterectomies.  Continue statin and antiplatelet therapy.  Signed, Murray Hodgkins, NP 08/01/2019, 11:12 AM  For questions or updates, please contact   Please consult [email or web address] for contact info under Cardiology/STEMI.

## 2019-08-01 NOTE — ED Notes (Signed)
This RN to bedside at this time, introduced self to patient. Explained plan of care to patient at this time. Pt states understanding. Pt A&O x4. Pt noted to be in NSR at a rate of 68-72. Pt denies any needs at this time. Call bell remains within reach.

## 2019-08-01 NOTE — Progress Notes (Signed)
ANTICOAGULATION CONSULT NOTE - Initial Consult  Pharmacy Consult for heparin Indication: atrial fibrillation  Allergies  Allergen Reactions  . Iodinated Diagnostic Agents     Hives all over body.   . Codeine Rash  . Penicillins Rash    Did it involve swelling of the face/tongue/throat, SOB, or low BP? No Did it involve sudden or severe rash/hives, skin peeling, or any reaction on the inside of your mouth or nose? No Did you need to seek medical attention at a hospital or doctor's office? No When did it last happen?over 50 years ago If all above answers are "NO", may proceed with cephalosporin use.    Patient Measurements: Weight: 184 lb 11.9 oz (83.8 kg) Heparin Dosing Weight: 62.2 kg  Vital Signs: Temp: 98 F (36.7 C) (01/15 0332) Temp Source: Oral (01/15 0332) BP: 113/76 (01/15 0430) Pulse Rate: 102 (01/15 0430)  Labs: Recent Labs    08/01/19 0346  HGB 14.6  HCT 44.3  PLT 268  CREATININE 0.77  TROPONINIHS 11    Estimated Creatinine Clearance: 56.9 mL/min (by C-G formula based on SCr of 0.77 mg/dL).   Medical History: Past Medical History:  Diagnosis Date  . Arthritis   . CAD (coronary artery disease)    a. s/p prior stenting; b. 05/2017 Cath ([facility] - [place]): LAD mild irregs, LCX 20m, OM2 40 ISR, RCA dominant, RPLV 50; c. 05/2019: s/p succesful orbital atherectomy and stenting of the ostial LCx, proximal OM1 and distal OM1 with DESx 3   . Carotid arterial disease ([place])    a. 02/2018 Carotid U/S: 1-39% bilat ICA stenosis. F/u prn.  . Carotid artery disease ([place])    a. s/p bilateral CEAs  . Chronic back pain   . Hyperlipidemia   . Hypertension   . Hypothyroidism   . Juxtafoveal telangietasis of both eyes   . PAD (peripheral artery disease) ([place])   . RBBB   . S/P TAVR (transcatheter aortic valve replacement)   . Severe aortic stenosis   . Sleep apnea    does not wear CPAP  . TIA (transient ischemic attack)     Medications:  Scheduled:  .  heparin  3,800 Units Intravenous Once    Assessment: Patient arrives w/ CP nonradiating w/ diaphoresis, s/p TAVR procedure 5 wks ago, nitro w/o relief, EKG showing afib w/ RVR and T wave abnormalities, initial trops 11, repeat pending, no anticoagulation PTA, baseline CBC WNL, aPTT, INR pending. Patient is being started on heparin drip for management of new onset afib w/ RVR and possible UA/ACS.  Goal of Therapy:  Heparin level 0.3-0.7 units/ml Monitor platelets by anticoagulation protocol: Yes   Plan:  Will bolus w/ heparin 3800 units IV x 1 Will start rate at 900 units/hr. Will check anti-Xa at 1400. Will monitor daily CBC's and adjust per anti-Xa levels.  Tobie Lords, PharmD, BCPS Clinical Pharmacist 08/01/2019,6:09 AM

## 2019-08-01 NOTE — H&P (Signed)
History and Physical    Victoria Burns [id number] DOB: 1940-08-06 DOA: 08/01/2019  I have briefly reviewed the patient's prior medical records in [place]  PCP: Pleas Koch, NP  Patient coming from: Home  Chief Complaint: Chest pain, palpitations  HPI: Victoria Burns is a 79 y.o. female with medical history significant of coronary artery disease with history of PCI, aortic stenosis status post TAVR about 5 weeks ago, carotid artery disease status post bilateral CEAs, HTN, HLD, hypothyroidism, who presents to the hospital with chief complaint of chest pain and palpitations.  Patient was awakened from sleep during the night with chest pain and palpitations, she was also feeling sweaty and diaphoretic along with nausea.  She took some nitroglycerin without relief and decided to come to the hospital.  She states that she was feeling normal up until this episode, denies any recent fever, chills, shortness of breath.  No chest pain.  She had no abdominal pain, no nausea or vomiting.  She denies any prior history of A. fib but does state that she has had palpitations in the past  ED Course: On presentation in the emergency room she was afebrile at 98, she was tachypneic with respiratory rate into the mid 20s and tachycardic with heart rate into the 130s-140s.  She was normotensive.  Rhythm showed atrial fibrillation with RVR.  She was placed on diltiazem infusion and then few hours later converted back to sinus rhythm.  She was also placed on heparin infusion for anticoagulation.  Review of Systems: All systems reviewed, and apart from HPI, all negative  Past Medical History:  Diagnosis Date  . Arthritis   . CAD (coronary artery disease)    a. s/p prior stenting; b. 05/2017 Cath ([facility] [place]): LAD mild irregs, LCX 25m, OM2 40 ISR, RCA dominant, RPLV 50; c. 05/2019: s/p succesful orbital atherectomy and stenting of the ostial LCx, proximal OM1 and distal OM1 with DESx 3     . Carotid arterial disease ([place])    a. 02/2018 Carotid U/S: 1-39% bilat ICA stenosis. F/u prn.  . Carotid artery disease ([place])    a. s/p bilateral CEAs  . Chronic back pain   . Hyperlipidemia   . Hypertension   . Hypothyroidism   . Juxtafoveal telangietasis of both eyes   . PAD (peripheral artery disease) ([place])   . RBBB   . S/P TAVR (transcatheter aortic valve replacement)   . Severe aortic stenosis   . Sleep apnea    does not wear CPAP  . TIA (transient ischemic attack)     Past Surgical History:  Procedure Laterality Date  . BACK SURGERY  08/2016  . BREAST SURGERY     Biopsy  . CAROTID ENDARTERECTOMY Bilateral   . CHOLECYSTECTOMY    . CORONARY ANGIOGRAPHY N/A 06/05/2019   Procedure: CORONARY ANGIOGRAPHY (CATH LAB);  Surgeon: Burnell Blanks, MD;  Location: [place] [facility];  Service: Cardiovascular;  Laterality: N/A;  . CORONARY ATHERECTOMY N/A 06/06/2019   Procedure: CORONARY ATHERECTOMY;  Surgeon: Martinique, Peter M, MD;  Location: [place] [facility];  Service: Cardiovascular;  Laterality: N/A;  . RIGHT/LEFT HEART CATH AND CORONARY ANGIOGRAPHY Bilateral 05/23/2019   Procedure: RIGHT/LEFT HEART CATH AND CORONARY ANGIOGRAPHY;  Surgeon: Minna Merritts, MD;  Location: [place] [facility];  Service: Cardiovascular;  Laterality: Bilateral;  . TEE WITHOUT CARDIOVERSION N/A 06/24/2019   Procedure: TRANSESOPHAGEAL ECHOCARDIOGRAM (TEE);  Surgeon: Burnell Blanks, MD;  Location: [place];  Service: Open Heart Surgery;  Laterality: N/A;  . TONSILLECTOMY AND ADENOIDECTOMY       reports that she has quit smoking. Her smoking use included cigarettes. She has never used smokeless tobacco. She reports that she does not drink alcohol or use drugs.  Allergies  Allergen Reactions  . Iodinated Diagnostic Agents     Hives all over body.   . Codeine Rash  . Penicillins Rash    Did it involve swelling of the face/tongue/throat, SOB, or low BP? No Did it involve sudden or  severe rash/hives, skin peeling, or any reaction on the inside of your mouth or nose? No Did you need to seek medical attention at a hospital or doctor's office? No When did it last happen?over 50 years ago If all above answers are "NO", may proceed with cephalosporin use.    Family History  Problem Relation Age of Onset  . Heart disease Mother   . Hyperlipidemia Mother   . Hypertension Mother   . Alcohol abuse Father   . COPD Father   . Heart attack Father   . Arthritis Sister   . Hypertension Sister   . Cancer Sister   . COPD Sister   . Heart attack Sister   . Heart disease Sister   . Hypertension Sister     Prior to Admission medications   Medication Sig Start Date End Date Taking? Authorizing Provider  amLODipine (NORVASC) 5 MG tablet Take 1 tablet (5 mg total) by mouth daily. 07/14/19 07/13/20 Yes Eileen Stanford, PA-C  aspirin 81 MG chewable tablet Chew 81 mg by mouth daily.   Yes [provider]  atorvastatin (LIPITOR) 80 MG tablet Take 1 tablet (80 mg total) by mouth daily at 6 PM. 05/19/19  Yes Gollan, Kathlene November, MD  cloNIDine (CATAPRES) 0.1 MG tablet Take 1 tablet by mouth 2 (two) times daily. 07/24/19  Yes [provider]  clopidogrel (PLAVIX) 75 MG tablet Take 1 tablet (75 mg total) by mouth daily. 06/19/19 06/13/20 Yes Gollan, Kathlene November, MD  furosemide (LASIX) 20 MG tablet Take 1 tablet (20 mg total) by mouth daily. 06/19/19 09/17/19 Yes Gollan, Kathlene November, MD  isosorbide mononitrate (IMDUR) 30 MG 24 hr tablet Take 1 tablet (30 mg total) by mouth 2 (two) times daily. 05/19/19  Yes Gollan, Kathlene November, MD  levothyroxine (SYNTHROID) 25 MCG tablet TAKE 1 TABLET BY MOUTH IN  THE MORNING ON AN EMPTY  STOMACH WITH WATER - NO  FOOD OR MEDICATIONS FOR 1/2 HOUR 06/23/19  Yes Pleas Koch, NP  losartan (COZAAR) 100 MG tablet Take 1 tablet (100 mg total) by mouth daily. 05/19/19  Yes Gollan, Kathlene November, MD  Misc Natural Products (FOCUSED MIND PO) Take 1 tablet  by mouth daily.    Yes [provider]  Multiple Vitamins-Minerals (ZINC PO) Take 1 tablet by mouth daily.    Yes [provider]  nitroGLYCERIN (NITROSTAT) 0.3 MG SL tablet DISSOLVE 1 TABLET UNDER THE TONGUE EVERY 5 MINUTES AS  NEEDED FOR CHEST PAIN. MAX  OF 3 TABLETS IN 15 MINUTES. CALL 911 IF PAIN PERSISTS. 07/07/19  Yes Gollan, Kathlene November, MD  Polyethyl Glycol-Propyl Glycol (SYSTANE) 0.4-0.3 % SOLN Place 1 drop into both eyes 2 (two) times daily as needed (Dry eye).   Yes [provider]  potassium chloride (KLOR-CON) 10 MEQ tablet Take 1 tablet (10 mEq total) by mouth daily. 06/19/19 09/17/19 Yes Minna Merritts, MD  VITAMIN D PO Take 1 tablet by  mouth daily.    Yes [provider]    Physical Exam: Vitals:   08/01/19 0530 08/01/19 0600 08/01/19 0630 08/01/19 0800  BP: 109/71  (!) 100/59 124/86  Pulse: 64  73 63  Resp: (!) 23  (!) 23 (!) 22  Temp:      TempSrc:      SpO2: 97%  96% 95%  Weight:  83.8 kg     Constitutional: No distress, she appears comfortable Eyes: PERRL, lids and conjunctivae normal ENMT: Mucous membranes are moist.  Neck: normal, supple Respiratory: clear to auscultation bilaterally, no wheezing, no crackles. Normal respiratory effort. No accessory muscle use.  Cardiovascular: Regular rate and rhythm, 3/6 SEM. No extremity edema. 2+ pedal pulses.  Abdomen: no tenderness, no masses palpated. Bowel sounds positive.  Musculoskeletal: no clubbing / cyanosis. Normal muscle tone.  Skin: no rashes, lesions, ulcers. No induration Neurologic: CN 2-12 grossly intact. Strength 5/5 in all 4.  Psychiatric: Normal judgment and insight. Alert and oriented x 3. Normal mood.   Labs on Admission: I have personally reviewed following labs and imaging studies  CBC: Recent Labs  Lab 08/01/19 0346  WBC 6.0  NEUTROABS 2.7  HGB 14.6  HCT 44.3  MCV 99.1  PLT [id number]   Basic Metabolic Panel: Recent Labs  Lab 08/01/19 0346  NA 140  K 3.7  CL  105  CO2 28  GLUCOSE 120*  BUN 15  CREATININE 0.77  CALCIUM 9.7   Liver Function Tests: Recent Labs  Lab 08/01/19 0346  AST 28  ALT 26  ALKPHOS 62  BILITOT 0.7  PROT 7.9  ALBUMIN 4.7   Coagulation Profile: Recent Labs  Lab 08/01/19 0625  INR 1.0   BNP (last 3 results) No results for input(s): PROBNP in the last 8760 hours. CBG: No results for input(s): GLUCAP in the last 168 hours. Thyroid Function Tests: No results for input(s): TSH, T4TOTAL, FREET4, T3FREE, THYROIDAB in the last 72 hours. Urine analysis:    Component Value Date/Time   COLORURINE YELLOW 06/20/2019 1010   APPEARANCEUR HAZY (A) 06/20/2019 1010   LABSPEC 1.012 06/20/2019 1010   PHURINE 5.0 06/20/2019 1010   GLUCOSEU NEGATIVE 06/20/2019 1010   HGBUR NEGATIVE 06/20/2019 1010   BILIRUBINUR NEGATIVE 06/20/2019 1010   KETONESUR NEGATIVE 06/20/2019 1010   PROTEINUR NEGATIVE 06/20/2019 1010   NITRITE NEGATIVE 06/20/2019 1010   LEUKOCYTESUR NEGATIVE 06/20/2019 1010     Radiological Exams on Admission: DG Chest Port 1 View  Result Date: 08/01/2019 CLINICAL DATA:  Chest pain EXAM: PORTABLE CHEST 1 VIEW COMPARISON:  06/24/2019 FINDINGS: Transcatheter aortic valve replacement. Stable mild cardiac enlargement. There is no edema, consolidation, effusion, or pneumothorax. No osseous findings. IMPRESSION: No evidence of active disease. Electronically Signed   By: Monte Fantasia M.D.   On: 08/01/2019 04:06    EKG: Independently reviewed.  Initial strips with A. fib with RVR, then EKG showed conversion to sinus rhythm with right bundle branch block  Assessment/Plan  Principal Problem Atrial fibrillation with RVR -New, no prior history of A. fib although per history may have underlying paroxysmal A. fib -patient's CHA2DS2-VASc Score for Stroke Risk is 5, she needs anticoagulation.  Patient was started on heparin, continue.  Reports no prior history of bleeding -Cardiology consulted, appreciate input -Given  recent TAVR obtain a 2D echo  Active Problems Essential hypertension -She is normotensive in the ED, on Cardizem, hold Norvasc, clonidine and furosemide for now.  Continue to monitor blood pressure  Coronary artery disease /  peripheral arterial disease -Resume home medications with Imdur, aspirin, statin, Plavix.  She did have chest pain but that was in the setting of A. fib with RVR, her troponin is minimally elevated 11 >> 22 -Once RVR resolved she is chest pain-free  Hyperlipidemia -Continue statin  Hypothyroidism -Given new onset A. fib we will repeat a TSH, continue home Synthroid  Aortic stenosis status post TAVR -Cardiology to see   Lab Results  Component Value Date   [place] NEGATIVE 08/01/2019   [place] NOT DETECTED 06/20/2019   [place] NEGATIVE 06/02/2019   [place] NEGATIVE 05/19/2019    DVT prophylaxis: Heparin infusion Code Status: Full code Family Communication: Discussed with patient, no family at bedside Disposition Plan: Admit to telemetry, likely home when ready Bed Type: Telemetry  Consults called: cardiology, Dr. Rockey Situ  Obs/Inp: Observation, anticipate home d/c within 24h   Marzetta Board, MD, PhD [facility]  Contact via [email or web address]  08/01/2019, 9:16 AM

## 2019-08-01 NOTE — Progress Notes (Signed)
*  PRELIMINARY RESULTS* Echocardiogram 2D Echocardiogram has been performed.  Victoria Burns 08/01/2019, 11:00 AM

## 2019-08-01 NOTE — ED Notes (Signed)
Pt feels better. Heart rate now 67. New EKG printed and given to Dr Beather Arbour.

## 2019-08-01 NOTE — ED Notes (Signed)
This RN spoke to patient's daughter per patient's request and gave her an update.

## 2019-08-01 NOTE — ED Notes (Signed)
Pt given diet ginger ale at this time per her request. Pt also using personal cell phone at this time.

## 2019-08-01 NOTE — ED Notes (Signed)
Heparin drip verified with Rico Junker, RN. This RN explained plan of care to patient, explained updated her husband Rush Landmark, with her permission while in lobby and verified husband's phone number while this RN with husband. Pt states understanding at this time. This RN explained importance of call bell use. Pt states understanding. Pt remains A&O x 4, remains in NSR in the monitor. Respirations even and unlabored, lights dimmed for patient comfort at this time.

## 2019-08-01 NOTE — ED Provider Notes (Addendum)
[facility] Emergency Department Provider Note   ____________________________________________   First MD Initiated Contact with Patient 08/01/19 0340     (approximate)  I have reviewed the triage vital signs and the nursing notes.   HISTORY  Chief Complaint Chest Pain    HPI Victoria Burns is a 79 y.o. female who presents to the ED from home with a chief complaint of chest pain and palpitations.  Symptoms associated with diaphoresis, shortness of breath and nausea.  Took 3 nitroglycerin without relief of symptoms.  Patient had TAVR procedure 5 weeks ago and states she has been recovering well.  Denies recent fever, cough, abdominal pain, vomiting or diarrhea.  Awoke prior to arrival with palpitations and chest pain.       Past Medical History:  Diagnosis Date  . Arthritis   . CAD (coronary artery disease)    a. s/p prior stenting; b. 05/2017 Cath ([facility] [place]): LAD mild irregs, LCX 70m, OM2 40 ISR, RCA dominant, RPLV 50; c. 05/2019: s/p succesful orbital atherectomy and stenting of the ostial LCx, proximal OM1 and distal OM1 with DESx 3   . Carotid arterial disease ([place])    a. 02/2018 Carotid U/S: 1-39% bilat ICA stenosis. F/u prn.  . Carotid artery disease ([place])    a. s/p bilateral CEAs  . Chronic back pain   . Hyperlipidemia   . Hypertension   . Hypothyroidism   . Juxtafoveal telangietasis of both eyes   . PAD (peripheral artery disease) ([place])   . RBBB   . S/P TAVR (transcatheter aortic valve replacement)   . Severe aortic stenosis   . Sleep apnea    does not wear CPAP  . TIA (transient ischemic attack)     Patient Active Problem List   Diagnosis Date Noted  . TIA (transient ischemic attack)   . RBBB   . Severe aortic stenosis   . CAD (coronary artery disease)   . Sleep apnea   . S/P TAVR (transcatheter aortic valve replacement)   . Osteoarthritis 01/23/2018  . PAD (peripheral artery disease) ([place]) 08/18/2017  .  Hypothyroidism 07/24/2017  . Mixed hyperlipidemia 07/24/2017  . Essential hypertension 07/24/2017    Past Surgical History:  Procedure Laterality Date  . BACK SURGERY  08/2016  . BREAST SURGERY     Biopsy  . CAROTID ENDARTERECTOMY Bilateral   . CHOLECYSTECTOMY    . CORONARY ANGIOGRAPHY N/A 06/05/2019   Procedure: CORONARY ANGIOGRAPHY (CATH LAB);  Surgeon: Burnell Blanks, MD;  Location: [facility];  Service: Cardiovascular;  Laterality: N/A;  . CORONARY ATHERECTOMY N/A 06/06/2019   Procedure: CORONARY ATHERECTOMY;  Surgeon: Martinique, Peter M, MD;  Location: [facility];  Service: Cardiovascular;  Laterality: N/A;  . RIGHT/LEFT HEART CATH AND CORONARY ANGIOGRAPHY Bilateral 05/23/2019   Procedure: RIGHT/LEFT HEART CATH AND CORONARY ANGIOGRAPHY;  Surgeon: Minna Merritts, MD;  Location: [facility];  Service: Cardiovascular;  Laterality: Bilateral;  . TEE WITHOUT CARDIOVERSION N/A 06/24/2019   Procedure: TRANSESOPHAGEAL ECHOCARDIOGRAM (TEE);  Surgeon: Burnell Blanks, MD;  Location: [facility];  Service: Open Heart Surgery;  Laterality: N/A;  . TONSILLECTOMY AND ADENOIDECTOMY      Prior to Admission medications   Medication Sig Start Date End Date Taking? Authorizing Provider  amLODipine (NORVASC) 5 MG tablet Take 1 tablet (5 mg total) by mouth daily. 07/14/19 07/13/20 Yes Eileen Stanford, PA-C  aspirin 81 MG chewable tablet Chew 81 mg by mouth daily.  Yes [provider]  atorvastatin (LIPITOR) 80 MG tablet Take 1 tablet (80 mg total) by mouth daily at 6 PM. 05/19/19  Yes Gollan, Kathlene November, MD  cloNIDine (CATAPRES) 0.1 MG tablet Take 1 tablet by mouth 2 (two) times daily. 07/24/19  Yes [provider]  clopidogrel (PLAVIX) 75 MG tablet Take 1 tablet (75 mg total) by mouth daily. 06/19/19 06/13/20 Yes Gollan, Kathlene November, MD  furosemide (LASIX) 20 MG tablet Take 1 tablet (20 mg total) by mouth daily. 06/19/19 09/17/19 Yes Gollan, Kathlene November, MD   isosorbide mononitrate (IMDUR) 30 MG 24 hr tablet Take 1 tablet (30 mg total) by mouth 2 (two) times daily. 05/19/19  Yes Gollan, Kathlene November, MD  levothyroxine (SYNTHROID) 25 MCG tablet TAKE 1 TABLET BY MOUTH IN  THE MORNING ON AN EMPTY  STOMACH WITH WATER - NO  FOOD OR MEDICATIONS FOR 1/2 HOUR 06/23/19  Yes Pleas Koch, NP  losartan (COZAAR) 100 MG tablet Take 1 tablet (100 mg total) by mouth daily. 05/19/19  Yes Gollan, Kathlene November, MD  Misc Natural Products (FOCUSED MIND PO) Take 1 tablet by mouth daily.    Yes [provider]  Multiple Vitamins-Minerals (ZINC PO) Take 1 tablet by mouth daily.    Yes [provider]  nitroGLYCERIN (NITROSTAT) 0.3 MG SL tablet DISSOLVE 1 TABLET UNDER THE TONGUE EVERY 5 MINUTES AS  NEEDED FOR CHEST PAIN. MAX  OF 3 TABLETS IN 15 MINUTES. CALL 911 IF PAIN PERSISTS. 07/07/19  Yes Gollan, Kathlene November, MD  Polyethyl Glycol-Propyl Glycol (SYSTANE) 0.4-0.3 % SOLN Place 1 drop into both eyes 2 (two) times daily as needed (Dry eye).   Yes [provider]  potassium chloride (KLOR-CON) 10 MEQ tablet Take 1 tablet (10 mEq total) by mouth daily. 06/19/19 09/17/19 Yes Gollan, Kathlene November, MD  VITAMIN D PO Take 1 tablet by mouth daily.    Yes [provider]    Allergies Iodinated diagnostic agents, Codeine, and Penicillins  Family History  Problem Relation Age of Onset  . Heart disease Mother   . Hyperlipidemia Mother   . Hypertension Mother   . Alcohol abuse Father   . COPD Father   . Heart attack Father   . Arthritis Sister   . Hypertension Sister   . Cancer Sister   . COPD Sister   . Heart attack Sister   . Heart disease Sister   . Hypertension Sister     Social History Social History   Tobacco Use  . Smoking status: Former Smoker    Types: Cigarettes  . Smokeless tobacco: Never Used  Substance Use Topics  . Alcohol use: No  . Drug use: No    Review of Systems  Constitutional: No fever/chills Eyes: No visual  changes. ENT: No sore throat. Cardiovascular: Positive for palpitations and chest pain. Respiratory: Positive for shortness of breath. Gastrointestinal: No abdominal pain.  Positive for nausea, no vomiting.  No diarrhea.  No constipation. Genitourinary: Negative for dysuria. Musculoskeletal: Negative for back pain. Skin: Negative for rash. Neurological: Negative for headaches, focal weakness or numbness.   ____________________________________________   PHYSICAL EXAM:  VITAL SIGNS: ED Triage Vitals [08/01/19 0340]  Enc Vitals Group     BP      Pulse      Resp      Temp      Temp src      SpO2      Weight      Height  Head Circumference      Peak Flow      Pain Score 9     Pain Loc      Pain Edu?      Excl. in [place]?     Constitutional: Alert and oriented.  Anxious appearing and in mild acute distress. Eyes: Conjunctivae are normal. PERRL. EOMI. Head: Atraumatic. Nose: No congestion/rhinnorhea. Mouth/Throat: Mucous membranes are moist.  Oropharynx non-erythematous. Neck: No stridor.   Cardiovascular: Tachycardic rate, irregular rhythm. Grossly normal heart sounds.  Good peripheral circulation. Respiratory: Normal respiratory effort.  No retractions. Lungs CTAB. Gastrointestinal: Soft and nontender. No distention. No abdominal bruits. No CVA tenderness. Musculoskeletal: No lower extremity tenderness nor edema.  No joint effusions. Neurologic:  Normal speech and language. No gross focal neurologic deficits are appreciated.  Skin:  Skin is warm, dry and intact. No rash noted. Psychiatric: Mood and affect are anxious. Speech and behavior are normal.  ____________________________________________   LABS (all labs ordered are listed, but only abnormal results are displayed)  Labs Reviewed  COMPREHENSIVE METABOLIC PANEL - Abnormal; Notable for the following components:      Result Value   Glucose, Bld 120 (*)    All other components within normal limits  BRAIN  NATRIURETIC PEPTIDE - Abnormal; Notable for the following components:   B Natriuretic Peptide 101.0 (*)    All other components within normal limits  RESPIRATORY PANEL BY RT PCR (FLU A&B, COVID)  CBC WITH DIFFERENTIAL/PLATELET  APTT  PROTIME-INR  HEPARIN LEVEL (UNFRACTIONATED)  TROPONIN I (HIGH SENSITIVITY)  TROPONIN I (HIGH SENSITIVITY)   ____________________________________________  EKG  ED ECG REPORT I, , J, the attending physician, personally viewed and interpreted this ECG.   Date: 08/01/2019  EKG Time: 0335  Rate: 149  Rhythm: atrial fibrillation, rate 149  Axis: LAD  Intervals:nonspecific intraventricular conduction delay  ST&T Change: Anterior lateral ischemia  ED ECG REPORT I, , J, the attending physician, personally viewed and interpreted this ECG.   Date: 08/01/2019  EKG Time: 0647  Rate: 65  Rhythm: normal EKG, normal sinus rhythm  Axis: Normal  Intervals:none  ST&T Change: Nonspecific   ____________________________________________  RADIOLOGY  ED MD interpretation: No acute cardiopulmonary process  Official radiology report(s): DG Chest Port 1 View  Result Date: 08/01/2019 CLINICAL DATA:  Chest pain EXAM: PORTABLE CHEST 1 VIEW COMPARISON:  06/24/2019 FINDINGS: Transcatheter aortic valve replacement. Stable mild cardiac enlargement. There is no edema, consolidation, effusion, or pneumothorax. No osseous findings. IMPRESSION: No evidence of active disease. Electronically Signed   By: Monte Fantasia M.D.   On: 08/01/2019 04:06    ____________________________________________   PROCEDURES  Procedure(s) performed (including Critical Care):  Procedures  CRITICAL CARE Performed by: Paulette Blanch   Total critical care time: 45 minutes  Critical care time was exclusive of separately billable procedures and treating other patients.  Critical care was necessary to treat or prevent imminent or life-threatening deterioration.  Critical  care was time spent personally by me on the following activities: development of treatment plan with patient and/or surrogate as well as nursing, discussions with consultants, evaluation of patient's response to treatment, examination of patient, obtaining history from patient or surrogate, ordering and performing treatments and interventions, ordering and review of laboratory studies, ordering and review of radiographic studies, pulse oximetry and re-evaluation of patient's condition.  ____________________________________________   INITIAL IMPRESSION / ASSESSMENT AND PLAN / ED COURSE  As part of my medical decision making, I reviewed the following data within the electronic medical  record:  Nursing notes reviewed and incorporated, Labs reviewed, EKG interpreted, Old chart reviewed, Radiograph reviewed, Discussed with admitting physician and Notes from prior ED visits     KAIRAH BRINKMEYER was evaluated in Emergency Department on 08/01/2019 for the symptoms described in the history of present illness. She was evaluated in the context of the global COVID-19 pandemic, which necessitated consideration that the patient might be at risk for infection with the SARS-CoV-2 virus that causes COVID-19. Institutional protocols and algorithms that pertain to the evaluation of patients at risk for COVID-19 are in a state of rapid change based on information released by regulatory bodies including the CDC and federal and state organizations. These policies and algorithms were followed during the patient's care in the ED.    79 year old female with CAD, recent TAVR who presents with new onset atrial fibrillation with rapid ventricular rate. Differential diagnosis includes, but is not limited to, ACS, aortic dissection, pulmonary embolism, cardiac tamponade, pneumothorax, pneumonia, pericarditis, myocarditis, GI-related causes including esophagitis/gastritis, and musculoskeletal chest wall pain.    Patient currently  in atrial fibrillation with a heart rate of 160s.  Hypertensive.  Will bolus low-dose diltiazem to start; consider drip as needed.   Clinical Course as of Aug 01 651  Fri Aug 01, 2019  [id number] Heart rate briefly dipped to 120s, now back up to 140s.  Will redose Cardizem and apply nitroglycerin paste.   [JS]  [id number] Heart rate 119-141 after second bolus Cardizem IV.  Will initiate Cardizem drip.   [JS]  [id number] Patient converted on 10 mg Cardizem drip.  Now in normal sinus rhythm at a rate of 65   [JS]    Clinical Course User Index [JS] Paulette Blanch, MD     ____________________________________________   FINAL CLINICAL IMPRESSION(S) / ED DIAGNOSES  Final diagnoses:  Chest pain, unspecified type  Atrial fibrillation with rapid ventricular response [facility])     ED Discharge Orders    None       Note:  This document was prepared using Dragon voice recognition software and may include unintentional dictation errors.   Paulette Blanch, MD 08/01/19 [id number]    Paulette Blanch, MD 08/01/19 [phone]

## 2019-08-01 NOTE — ED Notes (Signed)
Message sent to pharmacy regarding patient's meds.

## 2019-08-01 NOTE — ED Notes (Signed)
Report given to Brittany RN

## 2019-08-01 NOTE — ED Notes (Signed)
Attempted to call pt husband again. No answer.

## 2019-08-01 NOTE — ED Notes (Signed)
Echo at bedside at this time

## 2019-08-01 NOTE — ED Notes (Addendum)
Pt repositioned in bed, given meal tray and her glasses at this time. Pt visualized in NAD at this time. Medications administered per MD order. This RN addressed with Dr. Sharolyn Douglas regarding pt's HR 58-62 prior to administration of Pacerone, per Dr. Sharolyn Douglas, administer medication.

## 2019-08-01 NOTE — ED Notes (Signed)
Pt assisted to the bathroom by this RN at this time.

## 2019-08-01 NOTE — ED Triage Notes (Addendum)
Pt to triage via w/c with no distress noted; st awoke 12min PTA with mid upper CP, nonradiating and diaphoresis; 5wks ago had TAVR procedure; took 3NTG without relief

## 2019-08-02 DIAGNOSIS — I350 Nonrheumatic aortic (valve) stenosis: Secondary | ICD-10-CM | POA: Diagnosis not present

## 2019-08-02 DIAGNOSIS — I10 Essential (primary) hypertension: Secondary | ICD-10-CM | POA: Diagnosis not present

## 2019-08-02 DIAGNOSIS — R079 Chest pain, unspecified: Secondary | ICD-10-CM | POA: Diagnosis not present

## 2019-08-02 DIAGNOSIS — I2510 Atherosclerotic heart disease of native coronary artery without angina pectoris: Secondary | ICD-10-CM

## 2019-08-02 DIAGNOSIS — I4891 Unspecified atrial fibrillation: Secondary | ICD-10-CM | POA: Diagnosis not present

## 2019-08-02 LAB — CBC
HCT: 39.3 % (ref 36.0–46.0)
Hemoglobin: 12.7 g/dL (ref 12.0–15.0)
MCH: 32.3 pg (ref 26.0–34.0)
MCHC: 32.3 g/dL (ref 30.0–36.0)
MCV: 100 fL (ref 80.0–100.0)
Platelets: 248 10*3/uL (ref 150–400)
RBC: 3.93 MIL/uL (ref 3.87–5.11)
RDW: 14.6 % (ref 11.5–15.5)
WBC: 6.1 10*3/uL (ref 4.0–10.5)
nRBC: 0 % (ref 0.0–0.2)

## 2019-08-02 LAB — COMPREHENSIVE METABOLIC PANEL
ALT: 21 U/L (ref 0–44)
AST: 25 U/L (ref 15–41)
Albumin: 3.7 g/dL (ref 3.5–5.0)
Alkaline Phosphatase: 55 U/L (ref 38–126)
Anion gap: 7 (ref 5–15)
BUN: 15 mg/dL (ref 8–23)
CO2: 25 mmol/L (ref 22–32)
Calcium: 8.8 mg/dL — ABNORMAL LOW (ref 8.9–10.3)
Chloride: 109 mmol/L (ref 98–111)
Creatinine, Ser: 0.75 mg/dL (ref 0.44–1.00)
GFR calc Af Amer: >60 mL/min (ref >60)
GFR calc non Af Amer: >60 mL/min (ref >60)
Glucose, Bld: 101 mg/dL — ABNORMAL HIGH (ref 70–99)
Potassium: 3.9 mmol/L (ref 3.5–5.1)
Sodium: 141 mmol/L (ref 135–145)
Total Bilirubin: 0.6 mg/dL (ref 0.3–1.2)
Total Protein: 6.4 g/dL — ABNORMAL LOW (ref 6.5–8.1)

## 2019-08-02 MED ORDER — CLOPIDOGREL BISULFATE 75 MG PO TABS: 75.0000 mg | ORAL_TABLET | Freq: Every day | ORAL | 0 refills | Status: DC

## 2019-08-02 MED ORDER — POLYETHYLENE GLYCOL 3350 17 G PO PACK: 17.0000 g | PACK | Freq: Every day | ORAL | 0 refills | Status: AC | PRN

## 2019-08-02 MED ORDER — AMIODARONE HCL 200 MG PO TABS
200.0000 mg | ORAL_TABLET | Freq: Two times a day (BID) | ORAL | Status: DC
  Administered 2019-08-02: 200 mg via ORAL
  Filled 2019-08-02: qty 1

## 2019-08-02 MED ORDER — AMIODARONE HCL 200 MG PO TABS: 200.0000 mg | ORAL_TABLET | Freq: Two times a day (BID) | ORAL | 0 refills | Status: DC

## 2019-08-02 MED ORDER — APIXABAN 5 MG PO TABS: 5.0000 mg | ORAL_TABLET | Freq: Two times a day (BID) | ORAL | 0 refills | Status: DC

## 2019-08-02 MED ORDER — LOSARTAN POTASSIUM 100 MG PO TABS: 50.0000 mg | ORAL_TABLET | Freq: Every day | ORAL | 2 refills | Status: AC

## 2019-08-02 MED ORDER — POLYETHYLENE GLYCOL 3350 17 G PO PACK: 17.0000 g | PACK | Freq: Every day | ORAL | Status: DC | PRN

## 2019-08-02 NOTE — Discharge Instructions (Signed)

## 2019-08-02 NOTE — Plan of Care (Signed)
  Problem: Education: Goal: Knowledge of General Education information will improve Description: Including pain rating scale, medication(s)/side effects and non-pharmacologic comfort measures Outcome: Progressing   Problem: Pain Managment: Goal: General experience of comfort will improve Outcome: Progressing   Problem: Safety: Goal: Ability to remain free from injury will improve Outcome: Progressing   Problem: Education: Goal: Knowledge of disease or condition will improve Outcome: Progressing

## 2019-08-02 NOTE — Progress Notes (Signed)
Progress Note  Patient Name: Victoria Burns Date of Encounter: 08/02/2019  Primary Cardiologist: Nelva Bush, MD   Subjective   Feels well, doing okay, denies chest pain, shortness of breath or palpitations.  Inpatient Medications    Scheduled Meds: . amiodarone  200 mg Oral BID  . apixaban  5 mg Oral BID  . atorvastatin  80 mg Oral q1800  . clopidogrel  75 mg Oral Daily  . isosorbide mononitrate  30 mg Oral BID  . levothyroxine  25 mcg Oral Q0600   Continuous Infusions:  PRN Meds: acetaminophen **OR** acetaminophen, ondansetron **OR** ondansetron (ZOFRAN) IV, polyethylene glycol   Vital Signs    Vitals:   08/01/19 2024 08/02/19 0456 08/02/19 0457 08/02/19 0747  BP:   131/68 136/67  Pulse:   64 65  Resp:   18 18  Temp:   98 F (36.7 C) (!) 97.5 F (36.4 C)  TempSrc:   Oral Oral  SpO2:   97% 97%  Weight: 84.5 kg 84 kg    Height: 5\' 1"  (1.549 m)       Intake/Output Summary (Last 24 hours) at 08/02/2019 1103 Last data filed at 08/02/2019 0624 Gross per 24 hour  Intake 265.66 ml  Output 450 ml  Net -184.34 ml   Last 3 Weights 08/02/2019 08/01/2019 08/01/2019  Weight (lbs) 185 lb 3.2 oz 186 lb 4.6 oz 184 lb 11.9 oz  Weight (kg) 84.006 kg 84.5 kg 83.8 kg      Telemetry    Sinus rhythm heart rate 86- Personally Reviewed  ECG    No new EKG obtained  Physical Exam   GEN: No acute distress.   Neck: No JVD Cardiac: RRR, no murmurs, rubs, or gallops.  Respiratory: Clear to auscultation bilaterally. GI: Soft, nontender, non-distended  MS: No edema; No deformity. Neuro:  Nonfocal  Psych: Normal affect   Labs    High Sensitivity Troponin:   Recent Labs  Lab 08/01/19 0346 08/01/19 0542  TROPONINIHS 11 22*      Chemistry Recent Labs  Lab 08/01/19 0346 08/02/19 0456  NA 140 141  K 3.7 3.9  CL 105 109  CO2 28 25  GLUCOSE 120* 101*  BUN 15 15  CREATININE 0.77 0.75  CALCIUM 9.7 8.8*  PROT 7.9 6.4*  ALBUMIN 4.7 3.7  AST 28 25  ALT 26  21  ALKPHOS 62 55  BILITOT 0.7 0.6  GFRNONAA >60 >60  GFRAA >60 >60  ANIONGAP 7 7     Hematology Recent Labs  Lab 08/01/19 0346 08/02/19 0456  WBC 6.0 6.1  RBC 4.47 3.93  HGB 14.6 12.7  HCT 44.3 39.3  MCV 99.1 100.0  MCH 32.7 32.3  MCHC 33.0 32.3  RDW 14.6 14.6  PLT 268 248    BNP Recent Labs  Lab 08/01/19 0346  BNP 101.0*     DDimer No results for input(s): DDIMER in the last 168 hours.   Radiology    DG Chest Port 1 View  Result Date: 08/01/2019 CLINICAL DATA:  Chest pain EXAM: PORTABLE CHEST 1 VIEW COMPARISON:  06/24/2019 FINDINGS: Transcatheter aortic valve replacement. Stable mild cardiac enlargement. There is no edema, consolidation, effusion, or pneumothorax. No osseous findings. IMPRESSION: No evidence of active disease. Electronically Signed   By: Monte Fantasia M.D.   On: 08/01/2019 04:06   ECHOCARDIOGRAM COMPLETE  Result Date: 08/01/2019   ECHOCARDIOGRAM REPORT   Patient Name:   Victoria Burns Date of Exam: 08/01/2019 Medical Rec #:  [id number]         Height:       61.0 in Accession #:    [id number]        Weight:       184.7 lb Date of Birth:  10-18-1940          BSA:          1.83 m Patient Age:    79 years          BP:           124/86 mmHg Patient Gender: F                 HR:           66 bpm. Exam Location:  [facility] Procedure: 2D Echo, Cardiac Doppler and Color Doppler Indications:     I48.91 Atrial Fibrillation  History:         Patient has prior history of Echocardiogram examinations, most                  recent 07/17/2019. CAD, PAD; Risk Factors:Sleep Apnea,                  Hypertension and Dyslipidemia. 26 mm Medtronic 06/24/2019.  Sonographer:     Charmayne Sheer RDCS (AE) Referring Phys:  [place] Diagnosing Phys: Ida Rogue MD  Sonographer Comments: Suboptimal subcostal window. IMPRESSIONS  1. Left ventricular ejection fraction, by visual estimation, is 60 to 65%. The left ventricle has normal function. There is mildly increased left  ventricular hypertrophy.  2. Left ventricular diastolic parameters are consistent with Grade I diastolic dysfunction (impaired relaxation).  3. The left ventricle has no regional wall motion abnormalities.  4. Global right ventricle has normal systolic function.The right ventricular size is normal. No increase in right ventricular wall thickness.  5. Left atrial size was normal.  6. The aortic valve was not well visualized. medtronic TAVR in place. Aortic valve regurgitation is not visualized.  7. Aortic valve mean gradient measures 13.0 mmHg.  8. Mildly elevated pulmonary artery systolic pressure. FINDINGS  Left Ventricle: Left ventricular ejection fraction, by visual estimation, is 60 to 65%. The left ventricle has normal function. The left ventricle has no regional wall motion abnormalities. There is mildly increased left ventricular hypertrophy. Left ventricular diastolic parameters are consistent with Grade I diastolic dysfunction (impaired relaxation). Normal left atrial pressure. Right Ventricle: The right ventricular size is normal. No increase in right ventricular wall thickness. Global RV systolic function is has normal systolic function. The tricuspid regurgitant velocity is 2.64 m/s, and with an assumed right atrial pressure  of 10 mmHg, the estimated right ventricular systolic pressure is mildly elevated at 37.8 mmHg. Left Atrium: Left atrial size was normal in size. Right Atrium: Right atrial size was normal in size Pericardium: There is no evidence of pericardial effusion. Mitral Valve: The mitral valve is normal in structure. Mild mitral valve regurgitation. No evidence of mitral valve stenosis by observation. MV peak gradient, 7.7 mmHg. Tricuspid Valve: The tricuspid valve is normal in structure. Tricuspid valve regurgitation is not demonstrated. Aortic Valve: The aortic valve was not well visualized. Aortic valve regurgitation is not visualized. The aortic valve is structurally normal, with no  evidence of sclerosis or stenosis. Aortic valve mean gradient measures 13.0 mmHg. Aortic valve peak gradient measures 22.9 mmHg. Aortic valve area, by VTI measures 1.30 cm. Medtronic, stented aortic valve (TAVR) valve is present in the aortic position. Pulmonic Valve: The  pulmonic valve was normal in structure. Pulmonic valve regurgitation is not visualized. Pulmonic regurgitation is not visualized. Aorta: The aortic root, ascending aorta and aortic arch are all structurally normal, with no evidence of dilitation or obstruction. Venous: The inferior vena cava is normal in size with greater than 50% respiratory variability, suggesting right atrial pressure of 3 mmHg. IAS/Shunts: No atrial level shunt detected by color flow Doppler. There is no evidence of a patent foramen ovale. No ventricular septal defect is seen or detected. There is no evidence of an atrial septal defect.  LEFT VENTRICLE PLAX 2D LVIDd:         5.39 cm  Diastology LVIDs:         4.06 cm  LV e' lateral:   7.07 cm/s LV PW:         0.88 cm  LV E/e' lateral: 12.6 LV IVS:        0.63 cm  LV e' medial:    5.66 cm/s LVOT diam:     1.90 cm  LV E/e' medial:  15.7 LV SV:         68 ml LV SV Index:   35.18 LVOT Area:     2.84 cm  RIGHT VENTRICLE RV Basal diam:  2.02 cm LEFT ATRIUM             Index       RIGHT ATRIUM          Index LA diam:        3.90 cm 2.14 cm/m  RA Area:     9.13 cm LA Vol (A2C):   31.7 ml 17.36 ml/m RA Volume:   16.60 ml 9.09 ml/m LA Vol (A4C):   39.3 ml 21.52 ml/m LA Biplane Vol: 35.6 ml 19.49 ml/m  AORTIC VALVE                    PULMONIC VALVE AV Area (Vmax):    1.17 cm     PV Vmax:       1.28 m/s AV Area (Vmean):   1.13 cm     PV Vmean:      76.700 cm/s AV Area (VTI):     1.30 cm     PV VTI:        0.242 m AV Vmax:           239.50 cm/s  PV Peak grad:  6.6 mmHg AV Vmean:          165.500 cm/s PV Mean grad:  3.0 mmHg AV VTI:            0.444 m AV Peak Grad:      22.9 mmHg AV Mean Grad:      13.0 mmHg LVOT Vmax:          99.00 cm/s LVOT Vmean:        66.200 cm/s LVOT VTI:          0.203 m LVOT/AV VTI ratio: 0.46  AORTA Ao Root diam: 2.50 cm MITRAL VALVE                         TRICUSPID VALVE MV Area (PHT): 3.08 cm              TR Peak grad:   27.8 mmHg MV Peak grad:  7.7 mmHg              TR Vmax:  285.00 cm/s MV Mean grad:  3.0 mmHg MV Vmax:       1.39 m/s              SHUNTS MV Vmean:      79.6 cm/s             Systemic VTI:  0.20 m MV VTI:        0.38 m                Systemic Diam: 1.90 cm MV PHT:        71.34 msec MV Decel Time: 246 msec MV E velocity: 88.80 cm/s  103 cm/s MV A velocity: 118.00 cm/s 70.3 cm/s MV E/A ratio:  0.75        1.5  Ida Rogue MD Electronically signed by Ida Rogue MD Signature Date/Time: 08/01/2019/12:11:44 PM    Final     Cardiac Studies   TTE 08/01/2019 1. Left ventricular ejection fraction, by visual estimation, is 60 to 65%. The left ventricle has normal function. There is mildly increased left ventricular hypertrophy.  2. Left ventricular diastolic parameters are consistent with Grade I diastolic dysfunction (impaired relaxation).  3. The left ventricle has no regional wall motion abnormalities.  4. Global right ventricle has normal systolic function.The right ventricular size is normal. No increase in right ventricular wall thickness.  5. Left atrial size was normal.  6. The aortic valve was not well visualized. medtronic TAVR in place. Aortic valve regurgitation is not visualized.  7. Aortic valve mean gradient measures 13.0 mmHg.  8. Mildly elevated pulmonary artery systolic pressure.  Patient Profile     79 y.o. female with history of CAD status post stent to left circumflex and OM, severe aortic stenosis status post TAVR, pretension, TIA presenting with palpitations.  Found to have A. fib with RVR.  Converted to sinus rhythm after diltiazem infusion.  Currently maintaining sinus rhythm.  Assessment & Plan    1.  Paroxysmal A. fib with RVR -Currently in  sinus rhythm -Continue amiodarone 200 mg twice daily -Eliquis 5 twice daily -Echo with normal EF and grade 1 diastolic function.  Mildly increased LVH. -Follow-up as outpatient, will likely need down taper of amiodarone to daily dosing.  2.  History of severe aortic stenosis status post TAVR -Continue Plavix.  Stop aspirin  3.  History of CAD status post PCI -Stop aspirin, continue Plavix, Imdur, Lipitor.80 mg daily  4.  History of hypertension -Blood pressure well controlled on Imdur and amiodarone. -Continue -Ordered titration of PTA meds can be done as outpatient.   No further inpatient cardiac intervention or testing planned.  Patient will follow up as out patient with heart care.  Signed, Kate Sable, MD  08/02/2019, 11:03 AM

## 2019-08-02 NOTE — Plan of Care (Signed)

## 2019-08-03 DIAGNOSIS — R079 Chest pain, unspecified: Secondary | ICD-10-CM

## 2019-08-03 NOTE — Discharge Summary (Signed)
Physician Discharge Summary  Patient ID: Victoria Burns MRN: [id number] DOB/AGE: 09/01/1940 79 y.o.  Admit date: 08/01/2019 Discharge date: 08/02/19  Admission Diagnoses:  Discharge Diagnoses:  Active Problems:   Atrial fibrillation with RVR [facility])   Discharged Condition: fair  Hospital Course:   79 year old woman with history of coronary disease, prior stent to the circumflex and OM, severe aortic valve stenosis, s/p TAVR, carotid stenosis with bilateral endarterectomy, TIA, hypertension, hyperlipidemia presenting with acute onset tachycardia/palpitations, chest tightness and shortness of breath.   Atrial fibrillationwith RVR  --converted back to sinus rhythm on amiodarone 200 mg twice daily -Patient is to continue amiodarone 200 twice daily for now and will follow up with cardiology clinic for further dose adjustment; Cardiology clinic to call her with appointment -New, no prior history of A. fib although per history may have underlying paroxysmal A. fib -patient's CHA2DS2-VASc Score for Stroke Risk is5, she needs anticoagulation.  -Started on Eliquis 5 mg twice daily; continue  -Cardiology consulted  Essential hypertension -Stable on amiodarone  Coronary artery disease/peripheral arterial disease - no acute issues -Resume home medications with Imdur, aspirin, statin, Plavix.   -Once RVR resolved she is chest pain-free  Hyperlipidemia -Continue statin  Hypothyroidism -Given new onset A. fib we will repeat a TSH, continue home Synthroid  Aortic stenosis status post TAVR -Patient's aspirin is stopped.  And started Plavix.  Patient is to continue Plavix for now 1  Consults: cardiology  Significant Diagnostic Studies: Radiologic imaging and blood work  Treatments: As per hospital course and discharge med list    Discharge Exam: Blood pressure 136/67, pulse 65, temperature (!) 97.5 F (36.4 C), temperature source Oral, resp. rate 18, height 5\' 1"  (1.549  m), weight 84 kg, SpO2 97 %.  GEN:No acute distress.   Neck:No JVD Cardiac:RRR, no murmurs, rubs, or gallops.  Respiratory:Clear to auscultation bilaterally. [id number], nontender, non-distended  MS:No edema; No deformity. Neuro:Nonfocal  Psych: Normal affect   Disposition: Discharge disposition: 01-Home or Self Care     Patient was stable to be discharged home with close follow-up with cardiology.  Also treatment plan as explained to her.  Warning signs and symptoms given to patient when she must seek immediate medical attention.  Patient expressed understanding.  Discharge Instructions    Call MD for:  difficulty breathing, headache or visual disturbances   Complete by: As directed    Call MD for:  persistant dizziness or light-headedness   Complete by: As directed    Diet - low sodium heart healthy   Complete by: As directed    Increase activity slowly   Complete by: As directed      Allergies as of 08/02/2019      Reactions   Iodinated Diagnostic Agents    Hives all over body.    Codeine Rash   Penicillins Rash   Did it involve swelling of the face/tongue/throat, SOB, or low BP? No Did it involve sudden or severe rash/hives, skin peeling, or any reaction on the inside of your mouth or nose? No Did you need to seek medical attention at a hospital or doctor's office? No When did it last happen?over 50 years ago If all above answers are "NO", may proceed with cephalosporin use.      Medication List    STOP taking these medications   aspirin 81 MG chewable tablet     TAKE these medications   amiodarone 200 MG tablet Commonly known as: PACERONE Take 1 tablet (200 mg total) by  mouth 2 (two) times daily.   amLODipine 5 MG tablet Commonly known as: NORVASC Take 1 tablet (5 mg total) by mouth daily.   apixaban 5 MG Tabs tablet Commonly known as: ELIQUIS Take 1 tablet (5 mg total) by mouth 2 (two) times daily.   atorvastatin 80 MG tablet Commonly  known as: LIPITOR Take 1 tablet (80 mg total) by mouth daily at 6 PM.   cloNIDine 0.1 MG tablet Commonly known as: CATAPRES Take 1 tablet by mouth 2 (two) times daily.   clopidogrel 75 MG tablet Commonly known as: PLAVIX Take 1 tablet (75 mg total) by mouth daily.   FOCUSED MIND PO Take 1 tablet by mouth daily.   furosemide 20 MG tablet Commonly known as: LASIX Take 1 tablet (20 mg total) by mouth daily.   isosorbide mononitrate 30 MG 24 hr tablet Commonly known as: IMDUR Take 1 tablet (30 mg total) by mouth 2 (two) times daily.   levothyroxine 25 MCG tablet Commonly known as: SYNTHROID TAKE 1 TABLET BY MOUTH IN  THE MORNING ON AN EMPTY  STOMACH WITH WATER - NO  FOOD OR MEDICATIONS FOR 1/2 HOUR   losartan 100 MG tablet Commonly known as: COZAAR Take 0.5 tablets (50 mg total) by mouth daily. What changed: how much to take   nitroGLYCERIN 0.3 MG SL tablet Commonly known as: NITROSTAT DISSOLVE 1 TABLET UNDER THE TONGUE EVERY 5 MINUTES AS  NEEDED FOR CHEST PAIN. MAX  OF 3 TABLETS IN 15 MINUTES. CALL 911 IF PAIN PERSISTS.   polyethylene glycol 17 g packet Commonly known as: MIRALAX / GLYCOLAX Take 17 g by mouth daily as needed for moderate constipation.   potassium chloride 10 MEQ tablet Commonly known as: KLOR-CON Take 1 tablet (10 mEq total) by mouth daily.   Systane 0.4-0.3 % Soln Generic drug: Polyethyl Glycol-Propyl Glycol Place 1 drop into both eyes 2 (two) times daily as needed (Dry eye).   VITAMIN D PO Take 1 tablet by mouth daily.   ZINC PO Take 1 tablet by mouth daily.      Follow-up Information    Gollan, Kathlene November, MD. Schedule an appointment as soon as possible for a visit in 2 week(s).   Specialty: Cardiology Why: f/u  Contact information: [name] [address] [phone]        Pleas Koch, NP. Schedule an appointment as soon as possible for a visit in 1 week(s).   Specialty: Internal Medicine Why:  f/u Contact information: [place] [address] [phone]        Nelva Bush, MD .   Specialty: Cardiology Contact information: [place] [address] [phone]           Signed: Thornell Mule 08/03/2019, 3:57 PM

## 2019-08-05 ENCOUNTER — Telehealth: Payer: Self-pay | Admitting: Internal Medicine

## 2019-08-05 MED ORDER — APIXABAN 5 MG PO TABS: 5.0000 mg | ORAL_TABLET | Freq: Two times a day (BID) | ORAL | 1 refills | Status: DC

## 2019-08-05 NOTE — Telephone Encounter (Signed)
Prescription refill request for Eliquis received.  Last office visit: 07/07/2019 Scr: 0.75, 08/02/2019 Age: 79 y.o. Weight: 83.8kg  Prescription refill sent.

## 2019-08-05 NOTE — Telephone Encounter (Signed)
*  STAT* If patient is at the pharmacy, call can be transferred to refill team.   1. Which medications need to be refilled? (please list name of each medication and dose if known) Eliquis 5 mg po BID   2. Which pharmacy/location (including street and city if local pharmacy) is medication to be sent to? [facility] mail order  3. Do they need a 30 day or 90 day supply? 90   Will need prior auth per patient

## 2019-08-06 NOTE — Telephone Encounter (Signed)
PA submitted through Covermymeds. PA not required for pt at this time.   *This medication or product is on your plan's list of covered drugs. Prior authorization is not required at this time. If your pharmacy has questions regarding the processing of your prescription, please have them call the [facility] pharmacy help desk at ([phone]. **Please note: Formulary lowering, tiering exception, cost reduction and/or pre-benefit determination review (including prospective Medicare hospice reviews) requests cannot be requested using this method of submission. Please contact us at [phone] instead.

## 2019-08-06 NOTE — Telephone Encounter (Signed)
-----   Message from [place], LPN sent at [id number]  2:20 PM EST ----- Im not sure but I think the pt sees Dr End and Dr Karl Bales in our [place] office and only sees Dr Angelena Form post TAVR. Thanks ----- Message ----- From: Lutricia Feil, RN Sent: 08/05/2019  10:44 AM EST To: Deliah [place], LPN   Sent a prescription for Eliquis. However, pt was needing a prior authorization. Thank you!  Ebony Hail, RN [facility]

## 2019-08-08 ENCOUNTER — Other Ambulatory Visit: Payer: Self-pay

## 2019-08-08 ENCOUNTER — Ambulatory Visit (INDEPENDENT_AMBULATORY_CARE_PROVIDER_SITE_OTHER): Payer: Medicare Other | Admitting: Internal Medicine

## 2019-08-08 ENCOUNTER — Encounter: Payer: Self-pay | Admitting: Internal Medicine

## 2019-08-08 VITALS — BP 130/70 | HR 64 | Temp 98.8°F | Ht 61.0 in | Wt 183.5 lb

## 2019-08-08 DIAGNOSIS — I10 Essential (primary) hypertension: Secondary | ICD-10-CM

## 2019-08-08 DIAGNOSIS — I480 Paroxysmal atrial fibrillation: Secondary | ICD-10-CM

## 2019-08-08 DIAGNOSIS — I25118 Atherosclerotic heart disease of native coronary artery with other forms of angina pectoris: Secondary | ICD-10-CM

## 2019-08-08 DIAGNOSIS — Z952 Presence of prosthetic heart valve: Secondary | ICD-10-CM | POA: Diagnosis not present

## 2019-08-08 NOTE — Progress Notes (Signed)
Follow-up Outpatient Visit Date: 08/08/2019  Primary Care Provider: Pleas Koch, NP [place] [address]  Chief Complaint: Follow-up new atrial fibrillation, as well as CAD and AS s/p TAVR  HPI:  Victoria Burns is a 79 y.o. 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  She was previously followed in our practice by Dr. Rockey Situ but wished to transition her care to me.  She is also followed in the valve clinic by Dr. Angelena Form and Nell Range, PA.  She was last seen by Victoria Burns in late 06/2019, at which time she reported feeling significantly better than before her TAVR.  She was hospitalized at [facility] last week with atrial fibrillation with rapid ventricular response.  She converted to sinus rhythm with initiation of amiodarone.  Today, Victoria Burns reports that she feels well.  Since being discharged last week, she has not had any further chest pain, shortness of breath or palpitations.  She is tolerating apixaban an amiodarone well (currently still on 200 mg twice daily).  She denies bleeding other then scant blood when she blows her nose.  She also has not had any orthopnea, PND, or edema.  She received her first COVID-19 vaccine 2 weeks ago and is scheduled for her second dose next week.  --------------------------------------------------------------------------------------------------  Past Medical  History:  Diagnosis Date  . Arthritis   . CAD (coronary artery disease)    a. s/p remote OM stenting; b. 05/2017 Cath ([facility] [place]): LAD mild irregs, LCX 6m, OM2 40 ISR, RCA dominant, RPLV 50; c. 05/2019 Cath: LM nl, LAD mild-mod diff dzs, LCX 70, OM1 70-80p, 51m, RCA mod diff dzs; c. 05/2019 PCI/orbital atherectomy: OM1 60/80 (2.5x18 & 2.75x12 Resolute Onyx DES), LCX 75ost (3.5x12 Resolute Onyx DES).  . Carotid artery disease ([place])    a. s/p bilateral CEAs; b. 02/2018 Carotid U/S: 1-39% bilat ICA stenosis. F/u prn.  . Chronic back pain   . Hyperlipidemia   . Hypertension   . Hypothyroidism   . Juxtafoveal telangietasis of both eyes   . PAD (peripheral artery disease) ([place])    a. 05/2019 Cath: found to have 80% ost RCIA stenosis and 100 RSFA.  . Paroxysmal atrial fibrillation ([place]) 07/2018  . RBBB   . S/P TAVR (transcatheter aortic valve replacement)   . Severe aortic stenosis    a. 06/2019 TAVR: 50mm MDT Evolut Pro Plus TAVR; b. 06/2019 Echo: EF 60-65%, mod LVH. Gr1 DD. No rwma. AoV mean/peak grad 5.5/11.71mmHg. No AS.  Marland Kitchen Sleep apnea    does not wear CPAP  . TIA (transient ischemic attack)    Past Surgical History:  Procedure Laterality Date  . AORTIC VALVE REPLACEMENT    . BACK SURGERY  08/2016  . BREAST SURGERY     Biopsy  . CARDIAC CATHETERIZATION    . CAROTID ENDARTERECTOMY Bilateral   . CHOLECYSTECTOMY    . CORONARY ANGIOGRAPHY N/A 06/05/2019   Procedure: CORONARY ANGIOGRAPHY (CATH LAB);  Surgeon: Burnell Blanks, MD;  Location: [place]  CV LAB;  Service: Cardiovascular;  Laterality: N/A;  . CORONARY ANGIOPLASTY    . CORONARY ATHERECTOMY N/A 06/06/2019   Procedure: CORONARY ATHERECTOMY;  Surgeon: Martinique, Peter M, MD;  Location: [place] CV LAB;  Service: Cardiovascular;  Laterality: N/A;  . RIGHT/LEFT HEART CATH AND CORONARY ANGIOGRAPHY Bilateral 05/23/2019   Procedure: RIGHT/LEFT HEART CATH AND CORONARY ANGIOGRAPHY;  Surgeon: Minna Merritts, MD;   Location: [place] CV LAB;  Service: Cardiovascular;  Laterality: Bilateral;  . TEE WITHOUT CARDIOVERSION N/A 06/24/2019   Procedure: TRANSESOPHAGEAL ECHOCARDIOGRAM (TEE);  Surgeon: Burnell Blanks, MD;  Location: [place];  Service: Open Heart Surgery;  Laterality: N/A;  . TONSILLECTOMY AND ADENOIDECTOMY      Current Meds  Medication Sig  . amiodarone (PACERONE) 200 MG tablet Take 1 tablet (200 mg total) by mouth 2 (two) times daily. (Patient taking differently: Take 200 mg by mouth 2 (two) times daily. Starting 08/09/19 Take 200 mg twice daily for 2 weeks. Then reduce to 200 mg daily.)  . amLODipine (NORVASC) 5 MG tablet Take 1 tablet (5 mg total) by mouth daily.  Marland Kitchen apixaban (ELIQUIS) 5 MG TABS tablet Take 1 tablet (5 mg total) by mouth 2 (two) times daily.  Marland Kitchen atorvastatin (LIPITOR) 80 MG tablet Take 1 tablet (80 mg total) by mouth daily at 6 PM.  . clopidogrel (PLAVIX) 75 MG tablet Take 1 tablet (75 mg total) by mouth daily.  . furosemide (LASIX) 20 MG tablet Take 1 tablet (20 mg total) by mouth daily.  . isosorbide mononitrate (IMDUR) 30 MG 24 hr tablet Take 1 tablet (30 mg total) by mouth 2 (two) times daily.  Marland Kitchen levothyroxine (SYNTHROID) 25 MCG tablet TAKE 1 TABLET BY MOUTH IN  THE MORNING ON AN EMPTY  STOMACH WITH WATER - NO  FOOD OR MEDICATIONS FOR 1/2 HOUR  . losartan (COZAAR) 100 MG tablet Take 0.5 tablets (50 mg total) by mouth daily.  . Misc Natural Products (FOCUSED MIND PO) Take 1 tablet by mouth daily.   . Multiple Vitamins-Minerals (ZINC PO) Take 1 tablet by mouth daily.   . nitroGLYCERIN (NITROSTAT) 0.3 MG SL tablet DISSOLVE 1 TABLET UNDER THE TONGUE EVERY 5 MINUTES AS  NEEDED FOR CHEST PAIN. MAX  OF 3 TABLETS IN 15 MINUTES. CALL 911 IF PAIN PERSISTS.  [name] Glycol-Propyl Glycol (SYSTANE) 0.4-0.3 % SOLN Place 1 drop into both eyes 2 (two) times daily as needed (Dry eye).  . polyethylene glycol (MIRALAX / GLYCOLAX) 17 g packet Take 17 g by mouth daily as needed for  moderate constipation.  . potassium chloride (KLOR-CON) 10 MEQ tablet Take 1 tablet (10 mEq total) by mouth daily.  Marland Kitchen VITAMIN D PO Take 1 tablet by mouth daily.     Allergies: Iodinated diagnostic agents, Codeine, and Penicillins  Social History   Tobacco Use  . Smoking status: Former Smoker    Types: Cigarettes  . Smokeless tobacco: Never Used  Substance Use Topics  . Alcohol use: No  . Drug use: No    Family History  Problem Relation Age of Onset  . Heart disease Mother   . Hyperlipidemia Mother   . Hypertension Mother   . Alcohol abuse Father   . COPD Father   . Heart attack Father   . Arthritis Sister   . Hypertension Sister   . Cancer Sister   . COPD Sister   . Heart attack Sister   . Heart disease Sister   . Hypertension Sister  Review of Systems: A 12-system review of systems was performed and was negative except as noted in the HPI.  --------------------------------------------------------------------------------------------------  Physical Exam: BP 130/70 (BP Location: Left Arm, Patient Position: Sitting, Cuff Size: Normal)   Pulse 64   Temp 98.8 F (37.1 C)   Ht 5\' 1"  (1.549 m)   Wt 183 lb 8 oz (83.2 kg)   SpO2 98%   BMI 34.67 kg/m   General:  NAD.  Accompanied by her husband. HEENT: No conjunctival pallor or scleral icterus. Facemask in place. Neck: Supple without lymphadenopathy, thyromegaly, JVD, or HJR. Lungs: Normal work of breathing. Clear to auscultation bilaterally without wheezes or crackles. Heart: Regular rate and rhythm with 2/6 systolic murmur.  No rubs or gallops Non-displaced PMI. Abd: Bowel sounds present. Soft, NT/ND without hepatosplenomegaly Ext: Trace pretibial edema. Radial, PT, and DP pulses are 2+ bilaterally. Skin: Warm and dry without rash.  EKG:  NSR with RBBB and inferolateral TWI.  No significant change since most recent tracing on 08/01/19.  Lab Results  Component Value Date   WBC 6.1 08/02/2019   HGB 12.7  08/02/2019   HCT 39.3 08/02/2019   MCV 100.0 08/02/2019   PLT 248 08/02/2019    Lab Results  Component Value Date   NA 141 08/02/2019   K 3.9 08/02/2019   CL 109 08/02/2019   CO2 25 08/02/2019   BUN 15 08/02/2019   CREATININE 0.75 08/02/2019   GLUCOSE 101 (H) 08/02/2019   ALT 21 08/02/2019    Lab Results  Component Value Date   CHOL 123 01/23/2018   HDL 48.10 01/23/2018   LDLCALC 57 01/23/2018   TRIG 90.0 01/23/2018   CHOLHDL 3 01/23/2018   Lab Results  Component Value Date   TSH 1.563 08/01/2019   --------------------------------------------------------------------------------------------------  ASSESSMENT AND PLAN: Paroxysmal atrial fibrillation: Victoria Burns is maintaining sinus rhythm.  We will continue amiodarone 200 mg BID x 2 more weeks, after which we will switch to once daily dosing.  Victoria Burns will need to continue indefinite anticoagulation (currently on apixaban 5 mg BID).  Coronary artery disease with stable angina: No recurrent chest pain following restoration of sinus rhythm.  She is s/p atherectomy and PCI to the LCx in 05/2019; she will need to complete at least 6 months of apixaban and clopidogrel.  We will consider switching clopidogrel to low-dose aspirin 6 months after her PCI, depending on how well she is tolerating current therapy from a bleeding standpoint.  Continue atorvastatin for secondary prevention, as well as antianginal regimen of amlodipine and isosorbide mononitrate.  She is interested in virtual cardiac rehab option; we will refer her today.  Aortic stenosis s/p TAVR: Victoria Burns continues to do well.  Continue current medications and f/u with valve clinic.  Hypertension: BP upper normal today.  Continue current regimen of amlodipine, isosorbide mononitrate, and losartan.  Follow-up: Return to clinic in late 09/2019, as previously scheduled.  Nelva Bush, MD 08/09/2019 3:09 PM

## 2019-08-08 NOTE — Patient Instructions (Addendum)
Medication Instructions:  Your physician has recommended you make the following change in your medication:   Take Amiodarone 200 mg twice daily for 2 weeks. Then REDUCE  To 200 mg daily.  *If you need a refill on your cardiac medications before your next appointment, please call your pharmacy*  Lab Work: None ordered If you have labs (blood work) drawn today and your tests are completely normal, you will receive your results only by: Marland Kitchen MyChart Message (if you have MyChart) OR . A paper copy in the mail If you have any lab test that is abnormal or we need to change your treatment, we will call you to review the results.  Testing/Procedures: None ordered  Follow-Up: At [facility], you and your health needs are our priority.  As part of our continuing mission to provide you with exceptional heart care, we have created designated Provider Care Teams.  These Care Teams include your primary Cardiologist (physician) and Advanced Practice Providers (APPs -  Physician Assistants and Nurse Practitioners) who all work together to provide you with the care you need, when you need it.  Your next appointment:   As planned in March 2021   The format for your next appointment:   In Person  Provider:    You may see Nelva Bush, MD or one of the following Advanced Practice Providers on your designated Care Team:    Murray Hodgkins, NP  Christell Faith, PA-C  Marrianne Mood, PA-C   Other Instructions You have been referred to [facility]. They will contact you directly to schedule.

## 2019-08-09 ENCOUNTER — Encounter: Payer: Self-pay | Admitting: Internal Medicine

## 2019-08-12 ENCOUNTER — Other Ambulatory Visit: Payer: Self-pay | Admitting: Internal Medicine

## 2019-08-12 NOTE — Telephone Encounter (Signed)
*  STAT* If patient is at the pharmacy, call can be transferred to refill team.   1. Which medications need to be refilled? (please list name of each medication and dose if known)  Amiodarone 200 MG Eliquis 5 MG   2. Which pharmacy/location (including street and city if local pharmacy) is medication to be sent to? [facility]  3. Do they need a 30 day or 90 day supply? 90 day   Wants to make sure they both went to [facility] for 90 days

## 2019-08-14 ENCOUNTER — Other Ambulatory Visit: Payer: Self-pay

## 2019-08-14 MED ORDER — AMIODARONE HCL 200 MG PO TABS: ORAL_TABLET | ORAL | 0 refills | Status: DC

## 2019-08-14 MED ORDER — APIXABAN 5 MG PO TABS: 5.0000 mg | ORAL_TABLET | Freq: Two times a day (BID) | ORAL | 1 refills | Status: DC

## 2019-08-14 NOTE — Addendum Note (Signed)
Addended by: Allean Found on: 08/14/2019 11:58 AM   Modules accepted: Orders

## 2019-08-14 NOTE — Telephone Encounter (Signed)
Requested Prescriptions   Signed Prescriptions Disp Refills  . amiodarone (PACERONE) 200 MG tablet 126 tablet 0    Sig: Starting 08/09/19 Take 200 mg twice daily for 2 weeks. Then reduce to 200 mg daily.    Authorizing Provider: Minna Merritts    Ordering User: Britt Bottom

## 2019-08-14 NOTE — Telephone Encounter (Signed)
*  STAT* If patient is at the pharmacy, call can be transferred to refill team.   1. Which medications need to be refilled? (please list name of each medication and dose if known) Eliquis  2. Which pharmacy/location (including street and city if local pharmacy) is medication to be sent to? [facility]  3. Do they need a 30 day or 90 day supply? [place]

## 2019-08-14 NOTE — Telephone Encounter (Signed)
Please review for refill. Thanks!  

## 2019-08-14 NOTE — Telephone Encounter (Signed)
Refill request for Eliquis

## 2019-08-15 NOTE — Telephone Encounter (Signed)
[facility]ill [facility] yesterday. Called and left VM for patient to return call.

## 2019-08-15 NOTE — Telephone Encounter (Signed)
Patient calling to check on status of medication Almost out Please call to discuss

## 2019-08-17 ENCOUNTER — Other Ambulatory Visit: Payer: Self-pay | Admitting: Cardiovascular Disease

## 2019-08-18 ENCOUNTER — Other Ambulatory Visit: Payer: Self-pay

## 2019-08-18 ENCOUNTER — Encounter: Payer: Self-pay | Admitting: Family

## 2019-08-18 ENCOUNTER — Ambulatory Visit (INDEPENDENT_AMBULATORY_CARE_PROVIDER_SITE_OTHER): Payer: Medicare Other | Admitting: Family

## 2019-08-18 VITALS — BP 120/70 | Ht 61.0 in | Wt 178.0 lb

## 2019-08-18 DIAGNOSIS — E039 Hypothyroidism, unspecified: Secondary | ICD-10-CM

## 2019-08-18 DIAGNOSIS — Z1231 Encounter for screening mammogram for malignant neoplasm of breast: Secondary | ICD-10-CM

## 2019-08-18 DIAGNOSIS — I4891 Unspecified atrial fibrillation: Secondary | ICD-10-CM

## 2019-08-18 DIAGNOSIS — I25118 Atherosclerotic heart disease of native coronary artery with other forms of angina pectoris: Secondary | ICD-10-CM | POA: Diagnosis not present

## 2019-08-18 DIAGNOSIS — G4730 Sleep apnea, unspecified: Secondary | ICD-10-CM | POA: Diagnosis not present

## 2019-08-18 DIAGNOSIS — I10 Essential (primary) hypertension: Secondary | ICD-10-CM | POA: Diagnosis not present

## 2019-08-18 DIAGNOSIS — Z7689 Persons encountering health services in other specified circumstances: Secondary | ICD-10-CM | POA: Insufficient documentation

## 2019-08-18 MED ORDER — FUROSEMIDE 20 MG PO TABS: 20.0000 mg | ORAL_TABLET | Freq: Every day | ORAL | 0 refills | Status: DC

## 2019-08-18 MED ORDER — POTASSIUM CHLORIDE ER 10 MEQ PO TBCR: 10.0000 meq | EXTENDED_RELEASE_TABLET | Freq: Every day | ORAL | 0 refills | Status: AC

## 2019-08-18 NOTE — Assessment & Plan Note (Signed)
Well-controlled, continue current regimen.  Patient will also plan to Dr. Saunders Revel.  Will follow

## 2019-08-18 NOTE — Assessment & Plan Note (Signed)
Pending tsh 

## 2019-08-18 NOTE — Assessment & Plan Note (Signed)
Reviewed history with patient today.  Advised labs, Prevnar 13.  Patient will schedule bone density, mammogram.

## 2019-08-18 NOTE — Patient Instructions (Signed)
Please call call and schedule your 3D mammogram, bone density scan as discussed.   [place]  [place], [place]

## 2019-08-18 NOTE — Assessment & Plan Note (Signed)
New diagnosis.  Appears asymptomatic .on Eliquis and Plavix at this time -following with dr end for this.Will follow.

## 2019-08-18 NOTE — Assessment & Plan Note (Signed)
Not wearing CPAP.  Advised this elevates risk for stroke and heart attack

## 2019-08-18 NOTE — Progress Notes (Signed)
Virtual Visit via Video Note  I connected with@  on 08/18/19 at  2:30 PM EST by a video enabled telemedicine application and verified that I am speaking with the correct person using two identifiers.  Location patient: home Location provider:work or home office Persons participating in the virtual visit: patient, provider  I discussed the limitations of evaluation and management by telemedicine and the availability of in person appointments. The patient expressed understanding and agreed to proceed.   HPI: Feels well. No complaints.   HTN- compliant with medication. Taken today 120/70, 64. No CP, SOB, leg swelling.  Riding bicycle at home and doing steps at home. Using discretion with salt.  Recent afib diagnosis , CP has since resolved.  Dr End 08/08/19-severe aortic stenosis status post TAVR, complete heart block, atrial fibrillation, CAD and multiple PCI's.  In his note, patient maintaining sinus rhythm.  He will continue amiodarone and then switch to once daily dosing.  Indefinite anticoagulation, apixaban.  Stable angina, and she is on apixaban, clopidogrel.  Referred to virtual cardiac rehab  OSA- unable to tolerate cipap  Hypothyroidism- compliant with medication.  Feels well on current dose  ROS: See pertinent positives and negatives per HPI.  Past Medical History:  Diagnosis Date  . Arthritis   . CAD (coronary artery disease)    a. s/p remote OM stenting; b. 05/2017 Cath ([facility] [place]): LAD mild irregs, LCX 87m, OM2 40 ISR, RCA dominant, RPLV 50; c. 05/2019 Cath: LM nl, LAD mild-mod diff dzs, LCX 70, OM1 70-80p, 70m, RCA mod diff dzs; c. 05/2019 PCI/orbital atherectomy: OM1 60/80 (2.5x18 & 2.75x12 Resolute Onyx DES), LCX 75ost (3.5x12 Resolute Onyx DES).  . Carotid artery disease ([place])    a. s/p bilateral CEAs; b. 02/2018 Carotid U/S: 1-39% bilat ICA stenosis. F/u prn.  . Chronic back pain   . Hyperlipidemia   . Hypertension   . Hypothyroidism   . Juxtafoveal  telangietasis of both eyes   . PAD (peripheral artery disease) ([place])    a. 05/2019 Cath: found to have 80% ost RCIA stenosis and 100 RSFA.  . Paroxysmal atrial fibrillation ([place]) 07/2018  . RBBB   . S/P TAVR (transcatheter aortic valve replacement)   . Severe aortic stenosis    a. 06/2019 TAVR: 2mm MDT Evolut Pro Plus TAVR; b. 06/2019 Echo: EF 60-65%, mod LVH. Gr1 DD. No rwma. AoV mean/peak grad 5.5/11.62mmHg. No AS.  Marland Kitchen Sleep apnea    does not wear CPAP  . TIA (transient ischemic attack)     Past Surgical History:  Procedure Laterality Date  . AORTIC VALVE REPLACEMENT    . BACK SURGERY  08/2016  . BREAST SURGERY     Biopsy  . CARDIAC CATHETERIZATION    . CAROTID ENDARTERECTOMY Bilateral   . CHOLECYSTECTOMY    . CORONARY ANGIOGRAPHY N/A 06/05/2019   Procedure: CORONARY ANGIOGRAPHY (CATH LAB);  Surgeon: Burnell Blanks, MD;  Location: [place] CV LAB;  Service: Cardiovascular;  Laterality: N/A;  . CORONARY ANGIOPLASTY    . CORONARY ATHERECTOMY N/A 06/06/2019   Procedure: CORONARY ATHERECTOMY;  Surgeon: Martinique, Peter M, MD;  Location: [place] CV LAB;  Service: Cardiovascular;  Laterality: N/A;  . RIGHT/LEFT HEART CATH AND CORONARY ANGIOGRAPHY Bilateral 05/23/2019   Procedure: RIGHT/LEFT HEART CATH AND CORONARY ANGIOGRAPHY;  Surgeon: Minna Merritts, MD;  Location: [place] CV LAB;  Service: Cardiovascular;  Laterality: Bilateral;  . TEE WITHOUT CARDIOVERSION N/A 06/24/2019   Procedure: TRANSESOPHAGEAL ECHOCARDIOGRAM (TEE);  Surgeon: Angelena Form,  Annita Brod, MD;  Location: [place];  Service: Open Heart Surgery;  Laterality: N/A;  . TONSILLECTOMY AND ADENOIDECTOMY      Family History  Problem Relation Age of Onset  . Heart disease Mother   . Hyperlipidemia Mother   . Hypertension Mother   . Alcohol abuse Father   . COPD Father   . Heart attack Father   . Arthritis Sister   . Hypertension Sister   . Cancer Sister   . COPD Sister   . Heart attack Sister   .  Heart disease Sister   . Hypertension Sister     SOCIAL HX: former smoker   Current Outpatient Medications:  .  amiodarone (PACERONE) 200 MG tablet, Starting 08/09/19 Take 200 mg twice daily for 2 weeks. Then reduce to 200 mg daily., Disp: 126 tablet, Rfl: 0 .  amLODipine (NORVASC) 5 MG tablet, Take 1 tablet (5 mg total) by mouth daily., Disp: 90 tablet, Rfl: 3 .  apixaban (ELIQUIS) 5 MG TABS tablet, Take 1 tablet (5 mg total) by mouth 2 (two) times daily., Disp: 180 tablet, Rfl: 1 .  atorvastatin (LIPITOR) 80 MG tablet, Take 1 tablet (80 mg total) by mouth daily at 6 PM., Disp: 90 tablet, Rfl: 2 .  clopidogrel (PLAVIX) 75 MG tablet, Take 1 tablet (75 mg total) by mouth daily., Disp: 30 tablet, Rfl: 0 .  furosemide (LASIX) 20 MG tablet, Take 1 tablet (20 mg total) by mouth daily., Disp: 60 tablet, Rfl: 0 .  isosorbide mononitrate (IMDUR) 30 MG 24 hr tablet, Take 1 tablet (30 mg total) by mouth 2 (two) times daily., Disp: 180 tablet, Rfl: 2 .  levothyroxine (SYNTHROID) 25 MCG tablet, TAKE 1 TABLET BY MOUTH IN  THE MORNING ON AN EMPTY  STOMACH WITH WATER - NO  FOOD OR MEDICATIONS FOR 1/2 HOUR, Disp: 90 tablet, Rfl: 0 .  losartan (COZAAR) 100 MG tablet, Take 0.5 tablets (50 mg total) by mouth daily., Disp: 90 tablet, Rfl: 2 .  Misc Natural Products (FOCUSED MIND PO), Take 1 tablet by mouth daily. , Disp: , Rfl:  .  Multiple Vitamins-Minerals (ZINC PO), Take 1 tablet by mouth daily. , Disp: , Rfl:  .  nitroGLYCERIN (NITROSTAT) 0.3 MG SL tablet, DISSOLVE 1 TABLET UNDER THE TONGUE EVERY 5 MINUTES AS  NEEDED FOR CHEST PAIN. MAX  OF 3 TABLETS IN 15 MINUTES. CALL 911 IF PAIN PERSISTS., Disp: 25 tablet, Rfl: 3 .  Polyethyl Glycol-Propyl Glycol (SYSTANE) 0.4-0.3 % SOLN, Place 1 drop into both eyes 2 (two) times daily as needed (Dry eye)., Disp: , Rfl:  .  polyethylene glycol (MIRALAX / GLYCOLAX) 17 g packet, Take 17 g by mouth daily as needed for moderate constipation., Disp: 14 each, Rfl: 0 .  potassium  chloride (KLOR-CON) 10 MEQ tablet, Take 1 tablet (10 mEq total) by mouth daily., Disp: 60 tablet, Rfl: 0 .  VITAMIN D PO, Take 1 tablet by mouth daily. , Disp: , Rfl:   EXAM:  VITALS per patient if applicable:  GENERAL: alert, oriented, appears well and in no acute distress  HEENT: atraumatic, conjunttiva clear, no obvious abnormalities on inspection of external nose and ears  NECK: normal movements of the head and neck  LUNGS: on inspection no signs of respiratory distress, breathing rate appears normal, no obvious gross SOB, gasping or wheezing  CV: no obvious cyanosis  MS: moves all visible extremities without noticeable abnormality  PSYCH/NEURO: pleasant and cooperative, no obvious depression or anxiety, speech and  thought processing grossly intact  ASSESSMENT AND PLAN:  Discussed the following assessment and plan:  Encounter for screening mammogram for malignant neoplasm of breast - Plan: MM 3D SCREEN BREAST BILATERAL, DG Bone Density  Coronary artery disease of native artery of native heart with stable angina pectoris (HCC) - Plan: Lipid panel  Essential hypertension  Atrial fibrillation with rapid ventricular response (HCC)  Sleep apnea, unspecified type  Hypothyroidism, unspecified type  Encounter to establish care Problem List Items Addressed This Visit      Cardiovascular and Mediastinum   Atrial fibrillation with rapid ventricular response ([place])    New diagnosis.  Appears asymptomatic .on Eliquis and Plavix at this time -following with dr end for this.Will follow.       CAD (coronary artery disease)   Relevant Orders   Lipid panel   Essential hypertension    Well-controlled, continue current regimen.  Patient will also plan to Dr. Saunders Revel.  Will follow        Respiratory   Sleep apnea    Not wearing CPAP.  Advised this elevates risk for stroke and heart attack        Endocrine   Hypothyroidism    Pending tsh        Other   Encounter to  establish care    Reviewed history with patient today.  Advised labs, Prevnar 13.  Patient will schedule bone density, mammogram.       Other Visit Diagnoses    Encounter for screening mammogram for malignant neoplasm of breast    -  Primary   Relevant Orders   MM 3D SCREEN BREAST BILATERAL   DG Bone Density      -we discussed possible serious and likely etiologies, options for evaluation and workup, limitations of telemedicine visit vs in person visit, treatment, treatment risks and precautions. Pt prefers to treat via telemedicine empirically rather then risking or undertaking an in person visit at this moment. Patient agrees to seek prompt in person care if worsening, new symptoms arise, or if is not improving with treatment.   I discussed the assessment and treatment plan with the patient. The patient was provided an opportunity to ask questions and all were answered. The patient agreed with the plan and demonstrated an understanding of the instructions.   The patient was advised to call back or seek an in-person evaluation if the symptoms worsen or if the condition fails to improve as anticipated.   Mable Paris, FNP

## 2019-08-23 ENCOUNTER — Other Ambulatory Visit: Payer: Self-pay | Admitting: Cardiovascular Disease

## 2019-08-25 ENCOUNTER — Other Ambulatory Visit: Payer: Self-pay | Admitting: Primary Care

## 2019-08-25 DIAGNOSIS — E039 Hypothyroidism, unspecified: Secondary | ICD-10-CM

## 2019-08-26 NOTE — Telephone Encounter (Deleted)
Last prescribed on  . Last appointment on     . Next future appointment

## 2019-08-26 NOTE — Telephone Encounter (Signed)
Patient has transfer care to Mable Paris, FNP Last prescribed on 06/23/2019 by Allie Bossier, NP

## 2019-08-27 ENCOUNTER — Telehealth: Payer: Self-pay | Admitting: Cardiovascular Disease

## 2019-08-27 MED ORDER — AMIODARONE HCL 200 MG PO TABS: ORAL_TABLET | ORAL | 1 refills | Status: DC

## 2019-08-27 NOTE — Telephone Encounter (Signed)
I called and spoke with the pharmacist at [facility]. They were calling to clarify the patient's amiodarone RX- they had received a refill request from our office on 2/8 stating: Amiodarone 200 mg once daily. Starting 08/09/19 take amiodarone 200 mg BID x 2 weeks then decrease to 200 mg once daily.  I advised the pharmacy that the patient was seen in clinic on 1/22 and advised at that time to increase amiodarone 200 mg BID x 2 weeks. As of today/ yesterday, she should be taking amiodarone 200 mg once daily.   The pharmacist voices understanding and will change the amiodarone RX to 200 mg -take 1 tablet by mouth once daily.

## 2019-08-27 NOTE — Telephone Encounter (Signed)
Pt c/o medication issue:  1. Name of Medication: Amiodarone   2. How are you currently taking this medication (dosage and times per day)? Please advise   3. Are you having a reaction (difficulty breathing--STAT)?  4. What is your medication issue? Pharmacy calling to say patient just filled amio at [facility] from Dr. Izetta Dakin for 200 mg po BID on 08-02-19   Please call to discuss need for new rx from [facility]

## 2019-08-28 ENCOUNTER — Ambulatory Visit (INDEPENDENT_AMBULATORY_CARE_PROVIDER_SITE_OTHER): Payer: Medicare Other | Admitting: Family Medicine

## 2019-08-28 ENCOUNTER — Ambulatory Visit (INDEPENDENT_AMBULATORY_CARE_PROVIDER_SITE_OTHER)
Admission: RE | Admit: 2019-08-28 | Discharge: 2019-08-28 | Disposition: A | Discharge status: Home / Self Care | Payer: Medicare Other | Source: Ambulatory Visit | Attending: Family Medicine | Admitting: Family Medicine

## 2019-08-28 ENCOUNTER — Other Ambulatory Visit: Payer: Self-pay

## 2019-08-28 ENCOUNTER — Encounter: Payer: Self-pay | Admitting: Family Medicine

## 2019-08-28 VITALS — BP 130/64 | HR 61 | Temp 97.7°F | Ht 61.0 in

## 2019-08-28 DIAGNOSIS — M25562 Pain in left knee: Secondary | ICD-10-CM

## 2019-08-28 DIAGNOSIS — I4891 Unspecified atrial fibrillation: Secondary | ICD-10-CM | POA: Diagnosis not present

## 2019-08-28 MED ORDER — METHYLPREDNISOLONE ACETATE 40 MG/ML IJ SUSP
80.0000 mg | Freq: Once | INTRAMUSCULAR | Status: AC
  Administered 2019-08-28: 12:00:00 80 mg via INTRA_ARTICULAR

## 2019-08-28 NOTE — Progress Notes (Signed)
[place] T. Amareon Phung, MD Primary Care and Sports Medicine [facility] at [facility] [place], [address] Phone: [phone]  FAX: [phone]  Victoria Burns - 79 y.o. female  MRN [id number]  Date of Birth: June 24, 1941  Visit Date: 08/28/2019  PCP: Burnard Hawthorne, FNP  Referred by: Burnard Hawthorne, FNP  Chief Complaint  Patient presents with  . Knee Pain    Left    This visit occurred during the SARS-CoV-2 public health emergency.  Safety protocols were in place, including screening questions prior to the visit, additional usage of staff PPE, and extensive cleaning of exam room while observing appropriate contact time as indicated for disinfecting solutions.   Subjective:   Victoria Burns is a 79 y.o. very pleasant female patient with Body mass index is 33.63 kg/m. who presents with the following:  She is a very nice-year-old 79 year old lady and she had a traumatic incident last Thursday where she hit her foot and ankle after she hit it on a coffee table.  At this point she has no pain in the foot or ankle but she does have some knee pain on the left side, the affected lower extremity.  Normally at baseline she is quite active, but now she is using a walker and she is in a wheelchair today in the office.  She is not any prior significant injuries, fractures, dislocations or any operative intervention.  Today she is functionally impaired.  Additional history is provided by the patient's husband.  She complains mostly of pain in the back of her knee and some swelling.  No surgery.  L knee inj Deg men tear  Review of Systems is noted in the HPI, as appropriate   Objective:   BP 130/64   Pulse 61   Temp 97.7 F (36.5 C) (Temporal)   Ht 5\' 1"  (1.549 m)   SpO2 98%   BMI 33.63 kg/m   GEN: No acute distress; alert,appropriate. PULM: Breathing comfortably in no respiratory distress PSYCH: Normally interactive.    Left  knee: Full extension, flexion to 100 degrees.  Mild effusion.  Stable to varus and valgus stress with ACL PCL intact.  She does have tenderness on the medial lateral joint line and she has a notable popliteal cyst.  No pain with compression of the calf.  Both flexion pinch and McMurray's cause pain.  Bounce home also causes some pain.  No pain at the tibial plateau or at the patella.  No tenderness at the patellar tendon or quad tendon.  Radiology: DG Knee Complete 4 Views Left  Result Date: 08/28/2019 CLINICAL DATA:  Left knee pain. EXAM: LEFT KNEE - COMPLETE 4+ VIEW COMPARISON:  None. FINDINGS: No evidence of fracture, dislocation, or joint effusion. No evidence of arthropathy or other focal bone abnormality. Soft tissues are unremarkable. IMPRESSION: Negative. Electronically Signed   By: Marijo Conception M.D.   On: 08/28/2019 15:46    Assessment and Plan:     ICD-10-CM   1. Acute pain of left knee  M25.562 DG Knee Complete 4 Views Left    methylPREDNISolone acetate (DEPO-MEDROL) injection 80 mg  2. 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  No evidence of fracture and no other bony pathology with minimal arthritis.  She is also on Plavix which limits her use of oral NSAIDs.  Tylenol as needed.  Ice as needed.  Recommended a hinged brace, and we were able to fit her with a extra-large patellar J brace.  Continue to use a walker as she is able and use a exercise bike that she has at home.  I am going to do a diagnostic and therapeutic intra-articular injection of the left knee today.  I appreciate the opportunity to evaluate this very friendly patient. If you have any question regarding her care or prognosis, do not hesitate to ask.   Aspiration/Injection Procedure Note Victoria Burns 05-26-1941 Date of procedure: 08/28/2019  Procedure: Large Joint Aspiration /  Injection of Knee, L Indications: Pain  Procedure Details Patient verbally consented to procedure. Risks (including potential rare risk of infection), benefits, and alternatives explained. Sterilely prepped with Chloraprep. Ethyl cholride used for anesthesia. 8 cc Lidocaine 1% mixed with 2 mL Depo-Medrol 40 mg injected using the anteromedial approach without difficulty. No complications with procedure and tolerated well. Patient had decreased pain post-injection. Medication: 2 mL of Depo-Medrol 40 mg, equaling Depo-Medrol 80 mg total   Follow-up: Return in about 3 weeks (around 09/18/2019).  Meds ordered this encounter  Medications  . methylPREDNISolone acetate (DEPO-MEDROL) injection 80 mg   There are no discontinued medications. Orders Placed This Encounter  Procedures  . DG Knee Complete 4 Views Left    Signed,  Beyla Loney T. Allisson Schindel, MD   Outpatient Encounter Medications as of 08/28/2019  Medication Sig  . amiodarone (PACERONE) 200 MG tablet Take 1 tablet (200 mg) by mouth once daily  . amLODipine (NORVASC) 5 MG tablet Take 1 tablet (5 mg total) by mouth daily.  Marland Kitchen apixaban (ELIQUIS) 5 MG TABS tablet Take 1 tablet (5 mg total) by mouth 2 (two) times daily.  Marland Kitchen atorvastatin (LIPITOR) 80 MG tablet Take 1 tablet (80 mg total) by mouth daily at 6 PM.  . clopidogrel (PLAVIX) 75 MG tablet Take 1 tablet (75 mg total) by mouth daily.  . furosemide (LASIX) 20 MG tablet Take 1 tablet (20 mg total) by mouth daily.  . isosorbide mononitrate (IMDUR) 30 MG 24 hr tablet Take 1 tablet (30 mg total) by mouth 2 (two) times daily.  Marland Kitchen levothyroxine (SYNTHROID) 25 MCG tablet TAKE 1 TABLET BY MOUTH IN  THE MORNING ON AN EMPTY  STOMACH WITH WATER - NO  FOOD OR MEDICATIONS FOR 1/2 HOUR  . losartan (COZAAR) 100 MG tablet Take 0.5 tablets (50 mg total) by mouth daily.  . Misc Natural Products (FOCUSED MIND PO) Take 1 tablet by mouth daily.   . Multiple Vitamins-Minerals (ZINC PO) Take 1 tablet by mouth daily.    . nitroGLYCERIN (NITROSTAT) 0.3 MG SL tablet DISSOLVE 1 TABLET UNDER THE TONGUE EVERY 5 MINUTES AS  NEEDED FOR CHEST PAIN. MAX  OF 3 TABLETS IN 15 MINUTES. CALL 911 IF PAIN PERSISTS.  [name] Glycol-Propyl Glycol (SYSTANE) 0.4-0.3 % SOLN Place 1 drop into both eyes 2 (two) times daily as needed (Dry eye).  . polyethylene glycol (MIRALAX / GLYCOLAX) 17 g packet Take 17 g by mouth daily as needed for moderate constipation.  . potassium chloride (KLOR-CON) 10 MEQ tablet Take 1 tablet (10 mEq total) by mouth daily.  Marland Kitchen VITAMIN D PO Take 1 tablet by mouth daily.   . [EXPIRED] methylPREDNISolone acetate (DEPO-MEDROL) injection 80 mg    No facility-administered encounter medications on file as of 08/28/2019.

## 2019-09-03 ENCOUNTER — Other Ambulatory Visit (INDEPENDENT_AMBULATORY_CARE_PROVIDER_SITE_OTHER): Payer: Medicare Other

## 2019-09-03 ENCOUNTER — Other Ambulatory Visit: Payer: Self-pay

## 2019-09-03 ENCOUNTER — Encounter: Payer: Medicare Other | Attending: Cardiovascular Disease | Admitting: *Deleted

## 2019-09-03 ENCOUNTER — Encounter: Payer: Self-pay | Admitting: *Deleted

## 2019-09-03 ENCOUNTER — Ambulatory Visit (INDEPENDENT_AMBULATORY_CARE_PROVIDER_SITE_OTHER): Payer: Medicare Other

## 2019-09-03 DIAGNOSIS — Z23 Encounter for immunization: Secondary | ICD-10-CM | POA: Diagnosis not present

## 2019-09-03 DIAGNOSIS — Z952 Presence of prosthetic heart valve: Secondary | ICD-10-CM | POA: Insufficient documentation

## 2019-09-03 DIAGNOSIS — I25118 Atherosclerotic heart disease of native coronary artery with other forms of angina pectoris: Secondary | ICD-10-CM

## 2019-09-03 NOTE — Progress Notes (Signed)
Completed virtual orientation today.  EP evaluation is scheduled for 2/22 at 1pm.  Documentation for diagnosis can be found in [facility] encounter 06/24/19.

## 2019-09-03 NOTE — Addendum Note (Signed)
Addended by: Leeanne Rio on: 09/03/2019 12:56 PM   Modules accepted: Orders

## 2019-09-03 NOTE — Progress Notes (Addendum)
Patient presented for Prevnar 13 injection to left deltoid, patient voiced no concerns nor showed any signs of distress during injection.  Agree with plan. Mable Paris, NP

## 2019-09-04 LAB — LIPID PANEL
Cholesterol: 146 mg/dL (ref <200)
HDL: 50 mg/dL (ref >=50)
LDL Cholesterol (Calc): 74 mg/dL (calc)
Non-HDL Cholesterol (Calc): 96 mg/dL (calc) (ref <130)
Total CHOL/HDL Ratio: 2.9 (calc) (ref <5.0)
Triglycerides: 135 mg/dL (ref <150)

## 2019-09-08 ENCOUNTER — Encounter: Payer: Medicare Other | Admitting: *Deleted

## 2019-09-08 ENCOUNTER — Other Ambulatory Visit: Payer: Self-pay

## 2019-09-08 VITALS — Ht 60.1 in | Wt 179.9 lb

## 2019-09-08 DIAGNOSIS — Z952 Presence of prosthetic heart valve: Secondary | ICD-10-CM

## 2019-09-08 NOTE — Patient Instructions (Signed)
Patient Instructions  Patient Details  Name: Victoria Burns MRN: [id number] Date of Birth: [birth year] Referring Provider:  Minna Merritts, MD  Below are your personal goals for exercise, nutrition, and risk factors. Our goal is to help you stay on track towards obtaining and maintaining these goals. We will be discussing your progress on these goals with you throughout the program.  Initial Exercise Prescription: Initial Exercise Prescription - 09/08/19 1300      Date of Initial Exercise RX and Referring Provider   Date  09/08/19    Referring Provider  Ida Rogue MD    attending: Dr. Harrell Gave End     Treadmill   MPH  1.2    Grade  0    Minutes  15    METs  1.92      Recumbant Bike   Level  1    RPM  50    Watts  5    Minutes  15    METs  1.9      NuStep   Level  1    SPM  80    Minutes  15    METs  1.9      Recumbant Elliptical   Level  1    RPM  50    Minutes  15    METs  1.9      Prescription Details   Frequency (times per week)  2    Duration  Progress to 30 minutes of continuous aerobic without signs/symptoms of physical distress      Intensity   THRR 40-80% of Max Heartrate  92-125    Ratings of Perceived Exertion  11-13    Perceived Dyspnea  0-4      Progression   Progression  Continue to progress workloads to maintain intensity without signs/symptoms of physical distress.      Resistance Training   Training Prescription  Yes    Weight  3 lb    Reps  10-15       Exercise Goals: Frequency: Be able to perform aerobic exercise two to three times per week in program working toward 2-5 days per week of home exercise.  Intensity: Work with a perceived exertion of 11 (fairly light) - 15 (hard) while following your exercise prescription.  We will make changes to your prescription with you as you progress through the program.   Duration: Be able to do 30 to 45 minutes of continuous aerobic exercise in addition to a 5 minute warm-up and a 5  minute cool-down routine.   Nutrition Goals: Your personal nutrition goals will be established when you do your nutrition analysis with the dietician.  The following are general nutrition guidelines to follow: Cholesterol < 200mg /day Sodium < 1500mg /day Fiber: Women over 50 yrs - 21 grams per day  Personal Goals: Personal Goals and Risk Factors at Admission - 09/08/19 1352      Core Components/Risk Factors/Patient Goals on Admission    Weight Management  Yes;Weight Loss;Obesity    Intervention  Weight Management: Develop a combined nutrition and exercise program designed to reach desired caloric intake, while maintaining appropriate intake of nutrient and fiber, sodium and fats, and appropriate energy expenditure required for the weight goal.;Weight Management: Provide education and appropriate resources to help participant work on and attain dietary goals.;Weight Management/Obesity: Establish reasonable short term and long term weight goals.;Obesity: Provide education and appropriate resources to help participant work on and attain dietary goals.    Admit Weight  179 lb 14.4 oz (81.6 kg)    Goal Weight: Short Term  175 lb (79.4 kg)    Goal Weight: Long Term  170 lb (77.1 kg)    Expected Outcomes  Short Term: Continue to assess and modify interventions until short term weight is achieved;Long Term: Adherence to nutrition and physical activity/exercise program aimed toward attainment of established weight goal;Weight Loss: Understanding of general recommendations for a balanced deficit meal plan, which promotes 1-2 lb weight loss per week and includes a negative energy balance of [phone] kcal/d;Understanding recommendations for meals to include 15-35% energy as protein, 25-35% energy from fat, 35-60% energy from carbohydrates, less than 200mg  of dietary cholesterol, 20-35 gm of total fiber daily;Understanding of distribution of calorie intake throughout the day with the consumption of 4-5  meals/snacks    Hypertension  Yes    Intervention  Provide education on lifestyle modifcations including regular physical activity/exercise, weight management, moderate sodium restriction and increased consumption of fresh fruit, vegetables, and low fat dairy, alcohol moderation, and smoking cessation.;Monitor prescription use compliance.    Expected Outcomes  Short Term: Continued assessment and intervention until BP is < 140/84mm HG in hypertensive participants. < 130/9mm HG in hypertensive participants with diabetes, heart failure or chronic kidney disease.;Long Term: Maintenance of blood pressure at goal levels.    Lipids  Yes    Intervention  Provide education and support for participant on nutrition & aerobic/resistive exercise along with prescribed medications to achieve LDL 70mg , HDL >40mg .    Expected Outcomes  Short Term: Participant states understanding of desired cholesterol values and is compliant with medications prescribed. Participant is following exercise prescription and nutrition guidelines.;Long Term: Cholesterol controlled with medications as prescribed, with individualized exercise RX and with personalized nutrition plan. Value goals: LDL < 70mg , HDL > 40 mg.       Tobacco Use Initial Evaluation: Social History   Tobacco Use  Smoking Status Former Smoker  . Types: Cigarettes  Smokeless Tobacco Never Used    Exercise Goals and Review: Exercise Goals    Row Name 09/08/19 1351             Exercise Goals   Increase Physical Activity  Yes       Intervention  Provide advice, education, support and counseling about physical activity/exercise needs.;Develop an individualized exercise prescription for aerobic and resistive training based on initial evaluation findings, risk stratification, comorbidities and participant's personal goals.       Expected Outcomes  Short Term: Attend rehab on a regular basis to increase amount of physical activity.;Long Term: Add in home  exercise to make exercise part of routine and to increase amount of physical activity.;Long Term: Exercising regularly at least 3-5 days a week.       Increase Strength and Stamina  Yes       Intervention  Provide advice, education, support and counseling about physical activity/exercise needs.;Develop an individualized exercise prescription for aerobic and resistive training based on initial evaluation findings, risk stratification, comorbidities and participant's personal goals.       Expected Outcomes  Short Term: Increase workloads from initial exercise prescription for resistance, speed, and METs.;Short Term: Perform resistance training exercises routinely during rehab and add in resistance training at home;Long Term: Improve cardiorespiratory fitness, muscular endurance and strength as measured by increased METs and functional capacity (6MWT)       Able to understand and use rate of perceived exertion (RPE) scale  Yes       Intervention  Provide  education and explanation on how to use RPE scale       Expected Outcomes  Short Term: Able to use RPE daily in rehab to express subjective intensity level;Long Term:  Able to use RPE to guide intensity level when exercising independently       Able to understand and use Dyspnea scale  Yes       Intervention  Provide education and explanation on how to use Dyspnea scale       Expected Outcomes  Short Term: Able to use Dyspnea scale daily in rehab to express subjective sense of shortness of breath during exertion;Long Term: Able to use Dyspnea scale to guide intensity level when exercising independently       Knowledge and understanding of Target Heart Rate Range (THRR)  Yes       Intervention  Provide education and explanation of THRR including how the numbers were predicted and where they are located for reference       Expected Outcomes  Short Term: Able to state/look up THRR;Short Term: Able to use daily as guideline for intensity in rehab;Long Term:  Able to use THRR to govern intensity when exercising independently       Able to check pulse independently  Yes       Intervention  Provide education and demonstration on how to check pulse in carotid and radial arteries.;Review the importance of being able to check your own pulse for safety during independent exercise       Expected Outcomes  Long Term: Able to check pulse independently and accurately;Short Term: Able to explain why pulse checking is important during independent exercise       Understanding of Exercise Prescription  Yes       Intervention  Provide education, explanation, and written materials on patient's individual exercise prescription       Expected Outcomes  Short Term: Able to explain program exercise prescription;Long Term: Able to explain home exercise prescription to exercise independently          Copy of goals given to participant.

## 2019-09-08 NOTE — Progress Notes (Signed)
Cardiac Individual Treatment Plan  Patient Details  Name: HILJA KINTZEL MRN: [id number] Date of Birth: 04/21/1941 Referring Provider:     Cardiac Rehab from 09/08/2019 in [facility] Cardiac and Pulmonary Rehab  Referring Provider  Ida Rogue MD  [attending: Dr. Harrell Gave End]      Initial Encounter Date:    Cardiac Rehab from 09/08/2019 in [facility] Cardiac and Pulmonary Rehab  Date  09/08/19      Visit Diagnosis: S/P TAVR (transcatheter aortic valve replacement)  Patient's Home Medications on Admission:  Current Outpatient Medications:  .  amiodarone (PACERONE) 200 MG tablet, Take 1 tablet (200 mg) by mouth once daily, Disp: 90 tablet, Rfl: 1 .  amLODipine (NORVASC) 5 MG tablet, Take 1 tablet (5 mg total) by mouth daily., Disp: 90 tablet, Rfl: 3 .  apixaban (ELIQUIS) 5 MG TABS tablet, Take 1 tablet (5 mg total) by mouth 2 (two) times daily., Disp: 180 tablet, Rfl: 1 .  atorvastatin (LIPITOR) 80 MG tablet, Take 1 tablet (80 mg total) by mouth daily at 6 PM., Disp: 90 tablet, Rfl: 2 .  clopidogrel (PLAVIX) 75 MG tablet, Take 1 tablet (75 mg total) by mouth daily., Disp: 30 tablet, Rfl: 0 .  furosemide (LASIX) 20 MG tablet, Take 1 tablet (20 mg total) by mouth daily., Disp: 60 tablet, Rfl: 0 .  isosorbide mononitrate (IMDUR) 30 MG 24 hr tablet, Take 1 tablet (30 mg total) by mouth 2 (two) times daily., Disp: 180 tablet, Rfl: 2 .  levothyroxine (SYNTHROID) 25 MCG tablet, TAKE 1 TABLET BY MOUTH IN  THE MORNING ON AN EMPTY  STOMACH WITH WATER - NO  FOOD OR MEDICATIONS FOR 1/2 HOUR, Disp: 90 tablet, Rfl: 3 .  losartan (COZAAR) 100 MG tablet, Take 0.5 tablets (50 mg total) by mouth daily., Disp: 90 tablet, Rfl: 2 .  Misc Natural Products (FOCUSED MIND PO), Take 1 tablet by mouth daily. , Disp: , Rfl:  .  Multiple Vitamins-Minerals (ZINC PO), Take 1 tablet by mouth daily. , Disp: , Rfl:  .  nitroGLYCERIN (NITROSTAT) 0.3 MG SL tablet, DISSOLVE 1 TABLET UNDER THE TONGUE EVERY 5 MINUTES AS  NEEDED  FOR CHEST PAIN. MAX  OF 3 TABLETS IN 15 MINUTES. CALL 911 IF PAIN PERSISTS., Disp: 25 tablet, Rfl: 3 .  Polyethyl Glycol-Propyl Glycol (SYSTANE) 0.4-0.3 % SOLN, Place 1 drop into both eyes 2 (two) times daily as needed (Dry eye)., Disp: , Rfl:  .  polyethylene glycol (MIRALAX / GLYCOLAX) 17 g packet, Take 17 g by mouth daily as needed for moderate constipation., Disp: 14 each, Rfl: 0 .  potassium chloride (KLOR-CON) 10 MEQ tablet, Take 1 tablet (10 mEq total) by mouth daily., Disp: 60 tablet, Rfl: 0 .  VITAMIN D PO, Take 1 tablet by mouth daily. , Disp: , Rfl:   Past Medical History: Past Medical History:  Diagnosis Date  . Arthritis   . CAD (coronary artery disease)    a. s/p remote OM stenting; b. 05/2017 Cath ([facility] [place]): LAD mild irregs, LCX 80m OM2 40 ISR, RCA dominant, RPLV 50; c. 05/2019 Cath: LM nl, LAD mild-mod diff dzs, LCX 70, OM1 70-80p, 697mRCA mod diff dzs; c. 05/2019 PCI/orbital atherectomy: OM1 60/80 (2.5x18 & 2.75x12 Resolute Onyx DES), LCX 75ost (3.5x12 Resolute Onyx DES).  . Carotid artery disease ([place]   a. s/p bilateral CEAs; b. 02/2018 Carotid U/S: 1-39% bilat ICA stenosis. F/u prn.  . Chronic back pain   . Hyperlipidemia   .  Hypertension   . Hypothyroidism   . Juxtafoveal telangietasis of both eyes   . PAD (peripheral artery disease) ([place])    a. 05/2019 Cath: found to have 80% ost RCIA stenosis and 100 RSFA.  . Paroxysmal atrial fibrillation ([place]) 07/2018  . RBBB   . S/P TAVR (transcatheter aortic valve replacement)   . Severe aortic stenosis    a. 06/2019 TAVR: 57m MDT Evolut Pro Plus TAVR; b. 06/2019 Echo: EF 60-65%, mod LVH. Gr1 DD. No rwma. AoV mean/peak grad 5.5/11.419mg. No AS.  . Marland Kitchenleep apnea    does not wear CPAP  . TIA (transient ischemic attack)     Tobacco Use: Social History   Tobacco Use  Smoking Status Former Smoker  . Types: Cigarettes  Smokeless Tobacco Never Used    Labs: Recent Review Flowsheet Data    Labs for ITP Cardiac  and Pulmonary Rehab Latest Ref Rng & Units 01/23/2018 06/20/2019 06/24/2019 09/03/2019   Cholestrol <200 mg/dL 123 - - 146   LDLCALC mg/dL (calc) 57 - - 74   HDL > OR = 50 mg/dL 48.10 - - 50   Trlycerides <150 mg/dL 90.0 - - 135   Hemoglobin A1c 4.8 - 5.6 % - 6.1(H) - -   PHART 7.350 - 7.450 - 7.406 - -   PCO2ART 32.0 - 48.0 mmHg - 40.0 - -   HCO3 20.0 - 28.0 mmol/L - 24.6 - -   TCO2 22 - 32 mmol/L - - 24 -   O2SAT % - 97.7 - -       Exercise Target Goals: Exercise Program Goal: Individual exercise prescription set using results from initial 6 min walk test and THRR while considering  patient's activity barriers and safety.   Exercise Prescription Goal: Initial exercise prescription builds to 30-45 minutes a day of aerobic activity, 2-3 days per week.  Home exercise guidelines will be given to patient during program as part of exercise prescription that the participant will acknowledge.  Activity Barriers & Risk Stratification: Activity Barriers & Cardiac Risk Stratification - 09/08/19 1348      Activity Barriers & Cardiac Risk Stratification   Activity Barriers  Arthritis;Back Problems;Neck/Spine Problems;Muscular Weakness;Deconditioning;Joint Problems;Other (comment);Assistive Device;Balance Concerns    Comments  tripped an hit left knee and tore cartildige in it, uses walker outside    Cardiac Risk Stratification  High       6 Minute Walk: 6 Minute Walk    Row Name 09/08/19 1348         6 Minute Walk   Phase  Initial     Distance  680 feet     Walk Time  6 minutes     # of Rest Breaks  0     MPH  1.29     METS  0.73     RPE  14     VO2 Peak  2.56     Symptoms  Yes (comment)     Comments  dizzy on turns, knee pain 3/10     Resting HR  58 bpm     Resting BP  126/72     Resting Oxygen Saturation   98 %     Exercise Oxygen Saturation  during 6 min walk  95 %     Max Ex. HR  82 bpm     Max Ex. BP  134/66     2 Minute Post BP  134/60        Oxygen Initial  Assessment:  Oxygen Re-Evaluation:   Oxygen Discharge (Final Oxygen Re-Evaluation):   Initial Exercise Prescription: Initial Exercise Prescription - 09/08/19 1300      Date of Initial Exercise RX and Referring Provider   Date  09/08/19    Referring Provider  Ida Rogue MD    attending: Dr. Harrell Gave End     Treadmill   MPH  1.2    Grade  0    Minutes  15    METs  1.92      Recumbant Bike   Level  1    RPM  50    Watts  5    Minutes  15    METs  1.9      NuStep   Level  1    SPM  80    Minutes  15    METs  1.9      Recumbant Elliptical   Level  1    RPM  50    Minutes  15    METs  1.9      Prescription Details   Frequency (times per week)  2    Duration  Progress to 30 minutes of continuous aerobic without signs/symptoms of physical distress      Intensity   THRR 40-80% of Max Heartrate  92-125    Ratings of Perceived Exertion  11-13    Perceived Dyspnea  0-4      Progression   Progression  Continue to progress workloads to maintain intensity without signs/symptoms of physical distress.      Resistance Training   Training Prescription  Yes    Weight  3 lb    Reps  10-15       Perform Capillary Blood Glucose checks as needed.  Exercise Prescription Changes: Exercise Prescription Changes    Row Name 09/08/19 1300             Response to Exercise   Blood Pressure (Admit)  126/72       Blood Pressure (Exercise)  134/66       Blood Pressure (Exit)  134/60       Heart Rate (Admit)  58 bpm       Heart Rate (Exercise)  82 bpm       Heart Rate (Exit)  65 bpm       Oxygen Saturation (Admit)  98 %       Oxygen Saturation (Exercise)  95 %       Rating of Perceived Exertion (Exercise)  14       Symptoms  knee pain 3/10, dizzy on turns       Comments  walk test results          Exercise Comments:   Exercise Goals and Review: Exercise Goals    Row Name 09/08/19 1351             Exercise Goals   Increase Physical Activity  Yes        Intervention  Provide advice, education, support and counseling about physical activity/exercise needs.;Develop an individualized exercise prescription for aerobic and resistive training based on initial evaluation findings, risk stratification, comorbidities and participant's personal goals.       Expected Outcomes  Short Term: Attend rehab on a regular basis to increase amount of physical activity.;Long Term: Add in home exercise to make exercise part of routine and to increase amount of physical activity.;Long Term: Exercising regularly at least 3-5 days a week.  Increase Strength and Stamina  Yes       Intervention  Provide advice, education, support and counseling about physical activity/exercise needs.;Develop an individualized exercise prescription for aerobic and resistive training based on initial evaluation findings, risk stratification, comorbidities and participant's personal goals.       Expected Outcomes  Short Term: Increase workloads from initial exercise prescription for resistance, speed, and METs.;Short Term: Perform resistance training exercises routinely during rehab and add in resistance training at home;Long Term: Improve cardiorespiratory fitness, muscular endurance and strength as measured by increased METs and functional capacity (6MWT)       Able to understand and use rate of perceived exertion (RPE) scale  Yes       Intervention  Provide education and explanation on how to use RPE scale       Expected Outcomes  Short Term: Able to use RPE daily in rehab to express subjective intensity level;Long Term:  Able to use RPE to guide intensity level when exercising independently       Able to understand and use Dyspnea scale  Yes       Intervention  Provide education and explanation on how to use Dyspnea scale       Expected Outcomes  Short Term: Able to use Dyspnea scale daily in rehab to express subjective sense of shortness of breath during exertion;Long Term: Able to use  Dyspnea scale to guide intensity level when exercising independently       Knowledge and understanding of Target Heart Rate Range (THRR)  Yes       Intervention  Provide education and explanation of THRR including how the numbers were predicted and where they are located for reference       Expected Outcomes  Short Term: Able to state/look up THRR;Short Term: Able to use daily as guideline for intensity in rehab;Long Term: Able to use THRR to govern intensity when exercising independently       Able to check pulse independently  Yes       Intervention  Provide education and demonstration on how to check pulse in carotid and radial arteries.;Review the importance of being able to check your own pulse for safety during independent exercise       Expected Outcomes  Long Term: Able to check pulse independently and accurately;Short Term: Able to explain why pulse checking is important during independent exercise       Understanding of Exercise Prescription  Yes       Intervention  Provide education, explanation, and written materials on patient's individual exercise prescription       Expected Outcomes  Short Term: Able to explain program exercise prescription;Long Term: Able to explain home exercise prescription to exercise independently          Exercise Goals Re-Evaluation :   Discharge Exercise Prescription (Final Exercise Prescription Changes): Exercise Prescription Changes - 09/08/19 1300      Response to Exercise   Blood Pressure (Admit)  126/72    Blood Pressure (Exercise)  134/66    Blood Pressure (Exit)  134/60    Heart Rate (Admit)  58 bpm    Heart Rate (Exercise)  82 bpm    Heart Rate (Exit)  65 bpm    Oxygen Saturation (Admit)  98 %    Oxygen Saturation (Exercise)  95 %    Rating of Perceived Exertion (Exercise)  14    Symptoms  knee pain 3/10, dizzy on turns    Comments  walk test  results       Nutrition:  Target Goals: Understanding of nutrition guidelines, daily intake  of sodium <1510m, cholesterol <2043m calories 30% from fat and 7% or less from saturated fats, daily to have 5 or more servings of fruits and vegetables.  Biometrics: Pre Biometrics - 09/08/19 1351      Pre Biometrics   Height  5' 0.1" (1.527 m)    Weight  179 lb 14.4 oz (81.6 kg)    BMI (Calculated)  35    Single Leg Stand  3.25 seconds        Nutrition Therapy Plan and Nutrition Goals:   Nutrition Assessments: Nutrition Assessments - 09/03/19 1340      MEDFICTS Scores   Pre Score  15       Nutrition Goals Re-Evaluation:   Nutrition Goals Discharge (Final Nutrition Goals Re-Evaluation):   Psychosocial: Target Goals: Acknowledge presence or absence of significant depression and/or stress, maximize coping skills, provide positive support system. Participant is able to verbalize types and ability to use techniques and skills needed for reducing stress and depression.   Initial Review & Psychosocial Screening: Initial Psych Review & Screening - 09/03/19 1046      Initial Review   Current issues with  Current Stress Concerns    Source of Stress Concerns  Unable to perform yard/household activities    Comments  Worried about potential ice storm, surgery restrictions      Family Dynamics   Good Support System?  Yes   husband, two daughters ([place]    Barriers   Psychosocial barriers to participate in program  The patient should benefit from training in stress management and relaxation.;Psychosocial barriers identified (see note)      Screening Interventions   Interventions  Encouraged to exercise;To provide support and resources with identified psychosocial needs;Provide feedback about the scores to participant    Expected Outcomes  Short Term goal: Utilizing psychosocial counselor, staff and physician to assist with identification of specific Stressors or current issues interfering with healing process. Setting desired goal for each stressor or  current issue identified.;Long Term Goal: Stressors or current issues are controlled or eliminated.;Long Term goal: The participant improves quality of Life and PHQ9 Scores as seen by post scores and/or verbalization of changes;Short Term goal: Identification and review with participant of any Quality of Life or Depression concerns found by scoring the questionnaire.       Quality of Life Scores:  Quality of Life - 09/03/19 1341      Quality of Life   Select  Quality of Life      Quality of Life Scores   Health/Function Pre  25.4 %    Socioeconomic Pre  21.93 %    Psych/Spiritual Pre  24.92 %    Family Pre  27.6 %    GLOBAL Pre  24.82 %      Scores of 19 and below usually indicate a poorer quality of life in these areas.  A difference of  2-3 points is a clinically meaningful difference.  A difference of 2-3 points in the total score of the Quality of Life Index has been associated with significant improvement in overall quality of life, self-image, physical symptoms, and general health in studies assessing change in quality of life.  PHQ-9: Recent Review Flowsheet Data    [facility] 09/08/2019 08/18/2019 08/18/2019   Decreased Interest 3 0 0   Down, Depressed, Hopeless 0 0 0  PHQ - 2 Score 3 0 0   Altered sleeping 1 2 -   Tired, decreased energy 1 1 -   Change in appetite 1 0 -   Feeling bad or failure about yourself  0 0 -   Trouble concentrating 0 0 -   Moving slowly or fidgety/restless 0 0 -   Suicidal thoughts 0 0 -   PHQ-9 Score 6 3 -   Difficult doing work/chores Somewhat difficult Not difficult at all -     Interpretation of Total Score  Total Score Depression Severity:  1-4 = Minimal depression, 5-9 = Mild depression, 10-14 = Moderate depression, 15-19 = Moderately severe depression, 20-27 = Severe depression   Psychosocial Evaluation and Intervention: Psychosocial Evaluation - 09/03/19 1144      Psychosocial Evaluation & Interventions   Interventions   Encouraged to exercise with the program and follow exercise prescription;Stress management education    Comments  Marney is coming into cardiac rehab after TAVR.  She has also recently changed from Dr. Rockey Situ to Dr. Saunders Revel as her primary cardiologist.  She has a history of a TIA and known to be clumbsy as well.  She has some arthritis in her back and PAD that limit her activity.  She has been using a walker outside. She has a good support system in her husband. They have two daughters and one is an Therapist, sports and they both call regularly to check in on them.  She is eager to be able to get back on her feet again and regain her strength and confidence to be able to go!!    Expected Outcomes  Short: Attend rehab to regain strength.  Long: Continue to stay positive.    Continue Psychosocial Services   Follow up required by staff       Psychosocial Re-Evaluation:   Psychosocial Discharge (Final Psychosocial Re-Evaluation):   Vocational Rehabilitation: Provide vocational rehab assistance to qualifying candidates.   Vocational Rehab Evaluation & Intervention: Vocational Rehab - 09/03/19 1046      Initial Vocational Rehab Evaluation & Intervention   Assessment shows need for Vocational Rehabilitation  No   retired      Education: Education Goals: Education classes will be provided on a variety of topics geared toward better understanding of heart health and risk factor modification. Participant will state understanding/return demonstration of topics presented as noted by education test scores.  Learning Barriers/Preferences: Learning Barriers/Preferences - 09/03/19 1045      Learning Barriers/Preferences   Learning Barriers  Sight   glasses   Learning Preferences  Written Material;Individual Instruction       Education Topics:  AED/CPR: - Group verbal and written instruction with the use of models to demonstrate the basic use of the AED with the basic ABC's of resuscitation.   General  Nutrition Guidelines/Fats and Fiber: -Group instruction provided by verbal, written material, models and posters to present the general guidelines for heart healthy nutrition. Gives an explanation and review of dietary fats and fiber.   Controlling Sodium/Reading Food Labels: -Group verbal and written material supporting the discussion of sodium use in heart healthy nutrition. Review and explanation with models, verbal and written materials for utilization of the food label.   Exercise Physiology & General Exercise Guidelines: - Group verbal and written instruction with models to review the exercise physiology of the cardiovascular system and associated critical values. Provides general exercise guidelines with specific guidelines to those with heart or lung disease.    Aerobic Exercise &  Resistance Training: - Gives group verbal and written instruction on the various components of exercise. Focuses on aerobic and resistive training programs and the benefits of this training and how to safely progress through these programs..   Flexibility, Balance, Mind/Body Relaxation: Provides group verbal/written instruction on the benefits of flexibility and balance training, including mind/body exercise modes such as yoga, pilates and tai chi.  Demonstration and skill practice provided.   Stress and Anxiety: - Provides group verbal and written instruction about the health risks of elevated stress and causes of high stress.  Discuss the correlation between heart/lung disease and anxiety and treatment options. Review healthy ways to manage with stress and anxiety.   Depression: - Provides group verbal and written instruction on the correlation between heart/lung disease and depressed mood, treatment options, and the stigmas associated with seeking treatment.   Anatomy & Physiology of the Heart: - Group verbal and written instruction and models provide basic cardiac anatomy and physiology, with the  coronary electrical and arterial systems. Review of Valvular disease and Heart Failure   Cardiac Procedures: - Group verbal and written instruction to review commonly prescribed medications for heart disease. Reviews the medication, class of the drug, and side effects. Includes the steps to properly store meds and maintain the prescription regimen. (beta blockers and nitrates)   Cardiac Medications I: - Group verbal and written instruction to review commonly prescribed medications for heart disease. Reviews the medication, class of the drug, and side effects. Includes the steps to properly store meds and maintain the prescription regimen.   Cardiac Medications II: -Group verbal and written instruction to review commonly prescribed medications for heart disease. Reviews the medication, class of the drug, and side effects. (all other drug classes)    Go Sex-Intimacy & Heart Disease, Get SMART - Goal Setting: - Group verbal and written instruction through game format to discuss heart disease and the return to sexual intimacy. Provides group verbal and written material to discuss and apply goal setting through the application of the S.M.A.R.T. Method.   Other Matters of the Heart: - Provides group verbal, written materials and models to describe Stable Angina and Peripheral Artery. Includes description of the disease process and treatment options available to the cardiac patient.   Exercise & Equipment Safety: - Individual verbal instruction and demonstration of equipment use and safety with use of the equipment.   Cardiac Rehab from 09/08/2019 in [facility] Cardiac and Pulmonary Rehab  Date  09/08/19  Educator  [facility]  Instruction Review Code  1- Verbalizes Understanding      Infection Prevention: - Provides verbal and written material to individual with discussion of infection control including proper hand washing and proper equipment cleaning during exercise session.   Cardiac Rehab from  09/08/2019 in [facility] Cardiac and Pulmonary Rehab  Date  09/08/19  Educator  [facility]  Instruction Review Code  1- Verbalizes Understanding      Falls Prevention: - Provides verbal and written material to individual with discussion of falls prevention and safety.   Cardiac Rehab from 09/08/2019 in [facility] Cardiac and Pulmonary Rehab  Date  09/08/19  Educator  [facility]  Instruction Review Code  1- Verbalizes Understanding      Diabetes: - Individual verbal and written instruction to review signs/symptoms of diabetes, desired ranges of glucose level fasting, after meals and with exercise. Acknowledge that pre and post exercise glucose checks will be done for 3 sessions at entry of program.   Know Your Numbers and Risk Factors: -Group  verbal and written instruction about important numbers in your health.  Discussion of what are risk factors and how they play a role in the disease process.  Review of Cholesterol, Blood Pressure, Diabetes, and BMI and the role they play in your overall health.   Sleep Hygiene: -Provides group verbal and written instruction about how sleep can affect your health.  Define sleep hygiene, discuss sleep cycles and impact of sleep habits. Review good sleep hygiene tips.    Other: -Provides group and verbal instruction on various topics (see comments)   Knowledge Questionnaire Score: Knowledge Questionnaire Score - 09/03/19 1340      Knowledge Questionnaire Score   Pre Score  22/26 Education Focus: Exercise, Depression, A&P       Core Components/Risk Factors/Patient Goals at Admission: Personal Goals and Risk Factors at Admission - 09/08/19 1352      Core Components/Risk Factors/Patient Goals on Admission    Weight Management  Yes;Weight Loss;Obesity    Intervention  Weight Management: Develop a combined nutrition and exercise program designed to reach desired caloric intake, while maintaining appropriate intake of nutrient and fiber, sodium and fats, and appropriate  energy expenditure required for the weight goal.;Weight Management: Provide education and appropriate resources to help participant work on and attain dietary goals.;Weight Management/Obesity: Establish reasonable short term and long term weight goals.;Obesity: Provide education and appropriate resources to help participant work on and attain dietary goals.    Admit Weight  179 lb 14.4 oz (81.6 kg)    Goal Weight: Short Term  175 lb (79.4 kg)    Goal Weight: Long Term  170 lb (77.1 kg)    Expected Outcomes  Short Term: Continue to assess and modify interventions until short term weight is achieved;Long Term: Adherence to nutrition and physical activity/exercise program aimed toward attainment of established weight goal;Weight Loss: Understanding of general recommendations for a balanced deficit meal plan, which promotes 1-2 lb weight loss per week and includes a negative energy balance of [phone] kcal/d;Understanding recommendations for meals to include 15-35% energy as protein, 25-35% energy from fat, 35-60% energy from carbohydrates, less than 269m of dietary cholesterol, 20-35 gm of total fiber daily;Understanding of distribution of calorie intake throughout the day with the consumption of 4-5 meals/snacks    Hypertension  Yes    Intervention  Provide education on lifestyle modifcations including regular physical activity/exercise, weight management, moderate sodium restriction and increased consumption of fresh fruit, vegetables, and low fat dairy, alcohol moderation, and smoking cessation.;Monitor prescription use compliance.    Expected Outcomes  Short Term: Continued assessment and intervention until BP is < 140/956mHG in hypertensive participants. < 130/8056mG in hypertensive participants with diabetes, heart failure or chronic kidney disease.;Long Term: Maintenance of blood pressure at goal levels.    Lipids  Yes    Intervention  Provide education and support for participant on nutrition &  aerobic/resistive exercise along with prescribed medications to achieve LDL <4m15mDL >40mg91m Expected Outcomes  Short Term: Participant states understanding of desired cholesterol values and is compliant with medications prescribed. Participant is following exercise prescription and nutrition guidelines.;Long Term: Cholesterol controlled with medications as prescribed, with individualized exercise RX and with personalized nutrition plan. Value goals: LDL < 4mg,65m > 40 mg.       Core Components/Risk Factors/Patient Goals Review:    Core Components/Risk Factors/Patient Goals at Discharge (Final Review):    ITP Comments: ITP Comments    Row Name 09/03/19 1149 09/08/19 1347  ITP Comments  Completed virtual orientation today.  EP evaluation is scheduled for 2/22 at 1pm.  Documentation for diagnosis can be found in [facility] encounter 06/24/19.  Completed 6MWT and gym orientation.  Initial ITP created and sent for review to Dr. Emily Filbert, Medical Director.         Comments: Initial ITP

## 2019-09-09 ENCOUNTER — Other Ambulatory Visit: Payer: Self-pay

## 2019-09-09 ENCOUNTER — Encounter: Payer: Medicare Other | Admitting: *Deleted

## 2019-09-09 DIAGNOSIS — Z952 Presence of prosthetic heart valve: Secondary | ICD-10-CM | POA: Diagnosis not present

## 2019-09-09 NOTE — Progress Notes (Signed)
Daily Session Note  Patient Details  Name: Victoria Burns MRN: [id number] Date of Birth: [birth year] Referring Provider:     Cardiac Rehab from 09/08/2019 in [facility]  Referring Provider  Ida Rogue MD  [attending: Dr. Harrell Gave End]      Encounter Date: 09/09/2019  Check In: Session Check In - 09/09/19 1009      Check-In   Supervising physician immediately available to respond to emergencies  See telemetry face sheet for immediately available ER MD    Location  [facility]-Cardiac & Pulmonary Rehab    Staff Present  Heath Lark, RN, BSN, CCRP;Jessica Brookridge, MA, RCEP, CCRP, [place], [place], ACSM CEP, Exercise Physiologist    Virtual Visit  No    Medication changes reported      No    Fall or balance concerns reported     No    Warm-up and Cool-down  Performed on first and last piece of equipment    Resistance Training Performed  Yes    VAD Patient?  No    PAD/SET Patient?  No      Pain Assessment   Currently in Pain?  No/denies          Social History   Tobacco Use  Smoking Status Former Smoker  . Types: Cigarettes  Smokeless Tobacco Never Used    Goals Met:  Exercise tolerated well Personal goals reviewed No report of cardiac concerns or symptoms  Goals Unmet:  Not Applicable  Comments: First full day of exercise!  Patient was oriented to gym and equipment including functions, settings, policies, and procedures.  Patient's individual exercise prescription and treatment plan were reviewed.  All starting workloads were established based on the results of the 6 minute walk test done at initial orientation visit.  The plan for exercise progression was also introduced and progression will be customized based on patient's performance and goals.    Dr. Emily Filbert is Medical Director for [facility].

## 2019-09-10 ENCOUNTER — Encounter: Payer: Self-pay | Admitting: *Deleted

## 2019-09-10 DIAGNOSIS — Z952 Presence of prosthetic heart valve: Secondary | ICD-10-CM

## 2019-09-10 NOTE — Progress Notes (Signed)
Cardiac Individual Treatment Plan  Patient Details  Burns: Victoria Burns MRN: [id number] Date of Birth: [birth year] Referring Provider:     Cardiac Rehab from 09/08/2019 in [facility] Cardiac and Pulmonary Rehab  Referring Provider  Ida Rogue MD  [attending: Dr. Harrell Gave End]      Initial Encounter Date:    Cardiac Rehab from 09/08/2019 in [facility] Cardiac and Pulmonary Rehab  Date  09/08/19      Visit Diagnosis: S/P TAVR (transcatheter aortic valve replacement)  Patient's Home Medications on Admission:  Current Outpatient Medications:  .  amiodarone (PACERONE) 200 MG tablet, Take 1 tablet (200 mg) by mouth once daily, Disp: 90 tablet, Rfl: 1 .  amLODipine (NORVASC) 5 MG tablet, Take 1 tablet (5 mg total) by mouth daily., Disp: 90 tablet, Rfl: 3 .  apixaban (ELIQUIS) 5 MG TABS tablet, Take 1 tablet (5 mg total) by mouth 2 (two) times daily., Disp: 180 tablet, Rfl: 1 .  atorvastatin (LIPITOR) 80 MG tablet, Take 1 tablet (80 mg total) by mouth daily at 6 PM., Disp: 90 tablet, Rfl: 2 .  clopidogrel (PLAVIX) 75 MG tablet, Take 1 tablet (75 mg total) by mouth daily., Disp: 30 tablet, Rfl: 0 .  furosemide (LASIX) 20 MG tablet, Take 1 tablet (20 mg total) by mouth daily., Disp: 60 tablet, Rfl: 0 .  isosorbide mononitrate (IMDUR) 30 MG 24 hr tablet, Take 1 tablet (30 mg total) by mouth 2 (two) times daily., Disp: 180 tablet, Rfl: 2 .  levothyroxine (SYNTHROID) 25 MCG tablet, TAKE 1 TABLET BY MOUTH IN  THE MORNING ON AN EMPTY  STOMACH WITH WATER - NO  FOOD OR MEDICATIONS FOR 1/2 HOUR, Disp: 90 tablet, Rfl: 3 .  losartan (COZAAR) 100 MG tablet, Take 0.5 tablets (50 mg total) by mouth daily., Disp: 90 tablet, Rfl: 2 .  Misc Natural Products (FOCUSED MIND PO), Take 1 tablet by mouth daily. , Disp: , Rfl:  .  Multiple Vitamins-Minerals (ZINC PO), Take 1 tablet by mouth daily. , Disp: , Rfl:  .  nitroGLYCERIN (NITROSTAT) 0.3 MG SL tablet, DISSOLVE 1 TABLET UNDER THE TONGUE EVERY 5 MINUTES AS  NEEDED  FOR CHEST PAIN. MAX  OF 3 TABLETS IN 15 MINUTES. CALL 911 IF PAIN PERSISTS., Disp: 25 tablet, Rfl: 3 .  Polyethyl Glycol-Propyl Glycol (SYSTANE) 0.4-0.3 % SOLN, Place 1 drop into both eyes 2 (two) times daily as needed (Dry eye)., Disp: , Rfl:  .  polyethylene glycol (MIRALAX / GLYCOLAX) 17 g packet, Take 17 g by mouth daily as needed for moderate constipation., Disp: 14 each, Rfl: 0 .  potassium chloride (KLOR-CON) 10 MEQ tablet, Take 1 tablet (10 mEq total) by mouth daily., Disp: 60 tablet, Rfl: 0 .  VITAMIN D PO, Take 1 tablet by mouth daily. , Disp: , Rfl:   Past Medical History: Past Medical History:  Diagnosis Date  . Arthritis   . CAD (coronary artery disease)    a. s/p remote OM stenting; b. 05/2017 Cath ([facility]): LAD mild irregs, LCX 56m OM2 40 ISR, RCA dominant, RPLV 50; c. 05/2019 Cath: LM nl, LAD mild-mod diff dzs, LCX 70, OM1 70-80p, 658mRCA mod diff dzs; c. 05/2019 PCI/orbital atherectomy: OM1 60/80 (2.5x18 & 2.75x12 Resolute Onyx DES), LCX 75ost (3.5x12 Resolute Onyx DES).  . Carotid artery disease [place]   a. s/p bilateral CEAs; b. 02/2018 Carotid U/S: 1-39% bilat ICA stenosis. F/u prn.  . Chronic back pain   . Hyperlipidemia   .  Hypertension   . Hypothyroidism   . Juxtafoveal telangietasis of both eyes   . PAD (peripheral artery disease) ([place])    a. 05/2019 Cath: found to have 80% ost RCIA stenosis and 100 RSFA.  . Paroxysmal atrial fibrillation ([place]) 07/2018  . RBBB   . S/P TAVR (transcatheter aortic valve replacement)   . Severe aortic stenosis    a. 06/2019 TAVR: 40m MDT Evolut Pro Plus TAVR; b. 06/2019 Echo: EF 60-65%, mod LVH. Gr1 DD. No rwma. AoV mean/peak grad 5.5/11.460mg. No AS.  . Marland Kitchenleep apnea    does not wear CPAP  . TIA (transient ischemic attack)     Tobacco Use: Social History   Tobacco Use  Smoking Status Former Smoker  . Types: Cigarettes  Smokeless Tobacco Never Used    Labs: Recent Review Flowsheet Data    Labs for ITP Cardiac  and Pulmonary Rehab Latest Ref Rng & Units 01/23/2018 06/20/2019 06/24/2019 09/03/2019   Cholestrol <200 mg/dL 123 - - 146   LDLCALC mg/dL (calc) 57 - - 74   HDL > OR = 50 mg/dL 48.10 - - 50   Trlycerides <150 mg/dL 90.0 - - 135   Hemoglobin A1c 4.8 - 5.6 % - 6.1(H) - -   PHART 7.350 - 7.450 - 7.406 - -   PCO2ART 32.0 - 48.0 mmHg - 40.0 - -   HCO3 20.0 - 28.0 mmol/L - 24.6 - -   TCO2 22 - 32 mmol/L - - 24 -   O2SAT % - 97.7 - -       Exercise Target Goals: Exercise Program Goal: Individual exercise prescription set using results from initial 6 min walk test and THRR while considering  patient's activity barriers and safety.   Exercise Prescription Goal: Initial exercise prescription builds to 30-45 minutes a day of aerobic activity, 2-3 days per week.  Home exercise guidelines will be given to patient during program as part of exercise prescription that the participant will acknowledge.  Activity Barriers & Risk Stratification: Activity Barriers & Cardiac Risk Stratification - 09/08/19 1348      Activity Barriers & Cardiac Risk Stratification   Activity Barriers  Arthritis;Back Problems;Neck/Spine Problems;Muscular Weakness;Deconditioning;Joint Problems;Other (comment);Assistive Device;Balance Concerns    Comments  tripped an hit left knee and tore cartildige in it, uses walker outside    Cardiac Risk Stratification  High       6 Minute Walk: 6 Minute Walk    Row Burns 09/08/19 1348         6 Minute Walk   Phase  Initial     Distance  680 feet     Walk Time  6 minutes     # of Rest Breaks  0     MPH  1.29     METS  0.73     RPE  14     VO2 Peak  2.56     Symptoms  Yes (comment)     Comments  dizzy on turns, knee pain 3/10     Resting HR  58 bpm     Resting BP  126/72     Resting Oxygen Saturation   98 %     Exercise Oxygen Saturation  during 6 min walk  95 %     Max Ex. HR  82 bpm     Max Ex. BP  134/66     2 Minute Post BP  134/60        Oxygen Initial  Assessment:  Oxygen Re-Evaluation:   Oxygen Discharge (Final Oxygen Re-Evaluation):   Initial Exercise Prescription: Initial Exercise Prescription - 09/08/19 1300      Date of Initial Exercise RX and Referring Provider   Date  09/08/19    Referring Provider  Ida Rogue MD    attending: Dr. Harrell Gave End     Treadmill   MPH  1.2    Grade  0    Minutes  15    METs  1.92      Recumbant Bike   Level  1    RPM  50    Watts  5    Minutes  15    METs  1.9      NuStep   Level  1    SPM  80    Minutes  15    METs  1.9      Recumbant Elliptical   Level  1    RPM  50    Minutes  15    METs  1.9      Prescription Details   Frequency (times per week)  2    Duration  Progress to 30 minutes of continuous aerobic without signs/symptoms of physical distress      Intensity   THRR 40-80% of Max Heartrate  92-125    Ratings of Perceived Exertion  11-13    Perceived Dyspnea  0-4      Progression   Progression  Continue to progress workloads to maintain intensity without signs/symptoms of physical distress.      Resistance Training   Training Prescription  Yes    Weight  3 lb    Reps  10-15       Perform Capillary Blood Glucose checks as needed.  Exercise Prescription Changes: Exercise Prescription Changes    Row Burns 09/08/19 1300             Response to Exercise   Blood Pressure (Admit)  126/72       Blood Pressure (Exercise)  134/66       Blood Pressure (Exit)  134/60       Heart Rate (Admit)  58 bpm       Heart Rate (Exercise)  82 bpm       Heart Rate (Exit)  65 bpm       Oxygen Saturation (Admit)  98 %       Oxygen Saturation (Exercise)  95 %       Rating of Perceived Exertion (Exercise)  14       Symptoms  knee pain 3/10, dizzy on turns       Comments  walk test results          Exercise Comments: Exercise Comments    Row Burns 09/09/19 1010           Exercise Comments  First full day of exercise!  Patient was oriented to gym and  equipment including functions, settings, policies, and procedures.  Patient's individual exercise prescription and treatment plan were reviewed.  All starting workloads were established based on the results of the 6 minute walk test done at initial orientation visit.  The plan for exercise progression was also introduced and progression will be customized based on patient's performance and goals.          Exercise Goals and Review: Exercise Goals    Row Burns 09/08/19 1351             Exercise Goals   Increase  Physical Activity  Yes       Intervention  Provide advice, education, support and counseling about physical activity/exercise needs.;Develop an individualized exercise prescription for aerobic and resistive training based on initial evaluation findings, risk stratification, comorbidities and participant's personal goals.       Expected Outcomes  Short Term: Attend rehab on a regular basis to increase amount of physical activity.;Long Term: Add in home exercise to make exercise part of routine and to increase amount of physical activity.;Long Term: Exercising regularly at least 3-5 days a week.       Increase Strength and Stamina  Yes       Intervention  Provide advice, education, support and counseling about physical activity/exercise needs.;Develop an individualized exercise prescription for aerobic and resistive training based on initial evaluation findings, risk stratification, comorbidities and participant's personal goals.       Expected Outcomes  Short Term: Increase workloads from initial exercise prescription for resistance, speed, and METs.;Short Term: Perform resistance training exercises routinely during rehab and add in resistance training at home;Long Term: Improve cardiorespiratory fitness, muscular endurance and strength as measured by increased METs and functional capacity (6MWT)       Able to understand and use rate of perceived exertion (RPE) scale  Yes       Intervention   Provide education and explanation on how to use RPE scale       Expected Outcomes  Short Term: Able to use RPE daily in rehab to express subjective intensity level;Long Term:  Able to use RPE to guide intensity level when exercising independently       Able to understand and use Dyspnea scale  Yes       Intervention  Provide education and explanation on how to use Dyspnea scale       Expected Outcomes  Short Term: Able to use Dyspnea scale daily in rehab to express subjective sense of shortness of breath during exertion;Long Term: Able to use Dyspnea scale to guide intensity level when exercising independently       Knowledge and understanding of Target Heart Rate Range (THRR)  Yes       Intervention  Provide education and explanation of THRR including how the numbers were predicted and where they are located for reference       Expected Outcomes  Short Term: Able to state/look up THRR;Short Term: Able to use daily as guideline for intensity in rehab;Long Term: Able to use THRR to govern intensity when exercising independently       Able to check pulse independently  Yes       Intervention  Provide education and demonstration on how to check pulse in carotid and radial arteries.;Review the importance of being able to check your own pulse for safety during independent exercise       Expected Outcomes  Long Term: Able to check pulse independently and accurately;Short Term: Able to explain why pulse checking is important during independent exercise       Understanding of Exercise Prescription  Yes       Intervention  Provide education, explanation, and written materials on patient's individual exercise prescription       Expected Outcomes  Short Term: Able to explain program exercise prescription;Long Term: Able to explain home exercise prescription to exercise independently          Exercise Goals Re-Evaluation : Exercise Goals Re-Evaluation    Victoria Burns 09/09/19 1010  Exercise Goal  Re-Evaluation   Exercise Goals Review  Able to understand and use rate of perceived exertion (RPE) scale;Knowledge and understanding of Target Heart Rate Range (THRR);Able to check pulse independently;Understanding of Exercise Prescription       Comments  Reviewed RPE scale, THR and program prescription with pt today.  Pt voiced understanding and was given a copy of goals to take home.       Expected Outcomes  Short: Use RPE daily to regulate intensity. Long: Follow program prescription in THR.          Discharge Exercise Prescription (Final Exercise Prescription Changes): Exercise Prescription Changes - 09/08/19 1300      Response to Exercise   Blood Pressure (Admit)  126/72    Blood Pressure (Exercise)  134/66    Blood Pressure (Exit)  134/60    Heart Rate (Admit)  58 bpm    Heart Rate (Exercise)  82 bpm    Heart Rate (Exit)  65 bpm    Oxygen Saturation (Admit)  98 %    Oxygen Saturation (Exercise)  95 %    Rating of Perceived Exertion (Exercise)  14    Symptoms  knee pain 3/10, dizzy on turns    Comments  walk test results       Nutrition:  Target Goals: Understanding of nutrition guidelines, daily intake of sodium <1554m, cholesterol <2016m calories 30% from fat and 7% or less from saturated fats, daily to have 5 or more servings of fruits and vegetables.  Biometrics: Pre Biometrics - 09/08/19 1351      Pre Biometrics   Height  5' 0.1" (1.527 m)    Weight  179 lb 14.4 oz (81.6 kg)    BMI (Calculated)  35    Single Leg Stand  3.25 seconds        Nutrition Therapy Plan and Nutrition Goals:   Nutrition Assessments: Nutrition Assessments - 09/03/19 1340      MEDFICTS Scores   Pre Score  15       Nutrition Goals Re-Evaluation:   Nutrition Goals Discharge (Final Nutrition Goals Re-Evaluation):   Psychosocial: Target Goals: Acknowledge presence or absence of significant depression and/or stress, maximize coping skills, provide positive support system.  Participant is able to verbalize types and ability to use techniques and skills needed for reducing stress and depression.   Initial Review & Psychosocial Screening: Initial Psych Review & Screening - 09/03/19 1046      Initial Review   Current issues with  Current Stress Concerns    Source of Stress Concerns  Unable to perform yard/household activities    Comments  Worried about potential ice storm, surgery restrictions      Family Dynamics   Good Support System?  Yes   husband, two daughters ([place]    Barriers   Psychosocial barriers to participate in program  The patient should benefit from training in stress management and relaxation.;Psychosocial barriers identified (see note)      Screening Interventions   Interventions  Encouraged to exercise;To provide support and resources with identified psychosocial needs;Provide feedback about the scores to participant    Expected Outcomes  Short Term goal: Utilizing psychosocial counselor, staff and physician to assist with identification of specific Stressors or current issues interfering with healing process. Setting desired goal for each stressor or current issue identified.;Long Term Goal: Stressors or current issues are controlled or eliminated.;Long Term goal: The participant improves quality of Life and PHQ9 Scores  as seen by post scores and/or verbalization of changes;Short Term goal: Identification and review with participant of any Quality of Life or Depression concerns found by scoring the questionnaire.       Quality of Life Scores:  Quality of Life - 09/03/19 1341      Quality of Life   Select  Quality of Life      Quality of Life Scores   Health/Function Pre  25.4 %    Socioeconomic Pre  21.93 %    Psych/Spiritual Pre  24.92 %    Family Pre  27.6 %    GLOBAL Pre  24.82 %      Scores of 19 and below usually indicate a poorer quality of life in these areas.  A difference of  2-3 points is a  clinically meaningful difference.  A difference of 2-3 points in the total score of the Quality of Life Index has been associated with significant improvement in overall quality of life, self-image, physical symptoms, and general health in studies assessing change in quality of life.  PHQ-9: Recent Review Flowsheet Data    Depression screen [facility] 2/9 09/08/2019 08/18/2019 08/18/2019   Decreased Interest 3 0 0   Down, Depressed, Hopeless 0 0 0   PHQ - 2 Score 3 0 0   Altered sleeping 1 2 -   Tired, decreased energy 1 1 -   Change in appetite 1 0 -   Feeling bad or failure about yourself  0 0 -   Trouble concentrating 0 0 -   Moving slowly or fidgety/restless 0 0 -   Suicidal thoughts 0 0 -   PHQ-9 Score 6 3 -   Difficult doing work/chores Somewhat difficult Not difficult at all -     Interpretation of Total Score  Total Score Depression Severity:  1-4 = Minimal depression, 5-9 = Mild depression, 10-14 = Moderate depression, 15-19 = Moderately severe depression, 20-27 = Severe depression   Psychosocial Evaluation and Intervention: Psychosocial Evaluation - 09/03/19 1144      Psychosocial Evaluation & Interventions   Interventions  Encouraged to exercise with the program and follow exercise prescription;Stress management education    Comments  Victoria Burns is coming into cardiac rehab after TAVR.  She has also recently changed from Dr. Rockey Situ to Dr. Saunders Revel as her primary cardiologist.  She has a history of a TIA and known to be clumbsy as well.  She has some arthritis in her back and PAD that limit her activity.  She has been using a walker outside. She has a good support system in her husband. They have two daughters and one is an Therapist, sports and they both call regularly to check in on them.  She is eager to be able to get back on her feet again and regain her strength and confidence to be able to go!!    Expected Outcomes  Short: Attend rehab to regain strength.  Long: Continue to stay positive.    Continue  Psychosocial Services   Follow up required by staff       Psychosocial Re-Evaluation:   Psychosocial Discharge (Final Psychosocial Re-Evaluation):   Vocational Rehabilitation: Provide vocational rehab assistance to qualifying candidates.   Vocational Rehab Evaluation & Intervention: Vocational Rehab - 09/03/19 1046      Initial Vocational Rehab Evaluation & Intervention   Assessment shows need for Vocational Rehabilitation  No   retired      Education: Education Goals: Education classes will be provided on a variety  of topics geared toward better understanding of heart health and risk factor modification. Participant will state understanding/return demonstration of topics presented as noted by education test scores.  Learning Barriers/Preferences: Learning Barriers/Preferences - 09/03/19 1045      Learning Barriers/Preferences   Learning Barriers  Sight   glasses   Learning Preferences  Written Material;Individual Instruction       Education Topics:  AED/CPR: - Group verbal and written instruction with the use of models to demonstrate the basic use of the AED with the basic ABC's of resuscitation.   General Nutrition Guidelines/Fats and Fiber: -Group instruction provided by verbal, written material, models and posters to present the general guidelines for heart healthy nutrition. Gives an explanation and review of dietary fats and fiber.   Controlling Sodium/Reading Food Labels: -Group verbal and written material supporting the discussion of sodium use in heart healthy nutrition. Review and explanation with models, verbal and written materials for utilization of the food label.   Exercise Physiology & General Exercise Guidelines: - Group verbal and written instruction with models to review the exercise physiology of the cardiovascular system and associated critical values. Provides general exercise guidelines with specific guidelines to those with heart or lung  disease.    Aerobic Exercise & Resistance Training: - Gives group verbal and written instruction on the various components of exercise. Focuses on aerobic and resistive training programs and the benefits of this training and how to safely progress through these programs..   Flexibility, Balance, Mind/Body Relaxation: Provides group verbal/written instruction on the benefits of flexibility and balance training, including mind/body exercise modes such as yoga, pilates and tai chi.  Demonstration and skill practice provided.   Stress and Anxiety: - Provides group verbal and written instruction about the health risks of elevated stress and causes of high stress.  Discuss the correlation between heart/lung disease and anxiety and treatment options. Review healthy ways to manage with stress and anxiety.   Depression: - Provides group verbal and written instruction on the correlation between heart/lung disease and depressed mood, treatment options, and the stigmas associated with seeking treatment.   Anatomy & Physiology of the Heart: - Group verbal and written instruction and models provide basic cardiac anatomy and physiology, with the coronary electrical and arterial systems. Review of Valvular disease and Heart Failure   Cardiac Procedures: - Group verbal and written instruction to review commonly prescribed medications for heart disease. Reviews the medication, class of the drug, and side effects. Includes the steps to properly store meds and maintain the prescription regimen. (beta blockers and nitrates)   Cardiac Medications I: - Group verbal and written instruction to review commonly prescribed medications for heart disease. Reviews the medication, class of the drug, and side effects. Includes the steps to properly store meds and maintain the prescription regimen.   Cardiac Medications II: -Group verbal and written instruction to review commonly prescribed medications for heart  disease. Reviews the medication, class of the drug, and side effects. (all other drug classes)    Go Sex-Intimacy & Heart Disease, Get SMART - Goal Setting: - Group verbal and written instruction through game format to discuss heart disease and the return to sexual intimacy. Provides group verbal and written material to discuss and apply goal setting through the application of the S.M.A.R.T. Method.   Other Matters of the Heart: - Provides group verbal, written materials and models to describe Stable Angina and Peripheral Artery. Includes description of the disease process and treatment options available to the cardiac  patient.   Exercise & Equipment Safety: - Individual verbal instruction and demonstration of equipment use and safety with use of the equipment.   Cardiac Rehab from 09/08/2019 in [facility] Cardiac and Pulmonary Rehab  Date  09/08/19  Educator  [facility]  Instruction Review Code  1- Verbalizes Understanding      Infection Prevention: - Provides verbal and written material to individual with discussion of infection control including proper hand washing and proper equipment cleaning during exercise session.   Cardiac Rehab from 09/08/2019 in [facility] Cardiac and Pulmonary Rehab  Date  09/08/19  Educator  [facility]  Instruction Review Code  1- Verbalizes Understanding      Falls Prevention: - Provides verbal and written material to individual with discussion of falls prevention and safety.   Cardiac Rehab from 09/08/2019 in [facility] Cardiac and Pulmonary Rehab  Date  09/08/19  Educator  [facility]  Instruction Review Code  1- Verbalizes Understanding      Diabetes: - Individual verbal and written instruction to review signs/symptoms of diabetes, desired ranges of glucose level fasting, after meals and with exercise. Acknowledge that pre and post exercise glucose checks will be done for 3 sessions at entry of program.   Know Your Numbers and Risk Factors: -Group verbal and written instruction about  important numbers in your health.  Discussion of what are risk factors and how they play a role in the disease process.  Review of Cholesterol, Blood Pressure, Diabetes, and BMI and the role they play in your overall health.   Sleep Hygiene: -Provides group verbal and written instruction about how sleep can affect your health.  Define sleep hygiene, discuss sleep cycles and impact of sleep habits. Review good sleep hygiene tips.    Other: -Provides group and verbal instruction on various topics (see comments)   Knowledge Questionnaire Score: Knowledge Questionnaire Score - 09/03/19 1340      Knowledge Questionnaire Score   Pre Score  22/26 Education Focus: Exercise, Depression, A&P       Core Components/Risk Factors/Patient Goals at Admission: Personal Goals and Risk Factors at Admission - 09/08/19 1352      Core Components/Risk Factors/Patient Goals on Admission    Weight Management  Yes;Weight Loss;Obesity    Intervention  Weight Management: Develop a combined nutrition and exercise program designed to reach desired caloric intake, while maintaining appropriate intake of nutrient and fiber, sodium and fats, and appropriate energy expenditure required for the weight goal.;Weight Management: Provide education and appropriate resources to help participant work on and attain dietary goals.;Weight Management/Obesity: Establish reasonable short term and long term weight goals.;Obesity: Provide education and appropriate resources to help participant work on and attain dietary goals.    Admit Weight  179 lb 14.4 oz (81.6 kg)    Goal Weight: Short Term  175 lb (79.4 kg)    Goal Weight: Long Term  170 lb (77.1 kg)    Expected Outcomes  Short Term: Continue to assess and modify interventions until short term weight is achieved;Long Term: Adherence to nutrition and physical activity/exercise program aimed toward attainment of established weight goal;Weight Loss: Understanding of general  recommendations for a balanced deficit meal plan, which promotes 1-2 lb weight loss per week and includes a negative energy balance of [phone] kcal/d;Understanding recommendations for meals to include 15-35% energy as protein, 25-35% energy from fat, 35-60% energy from carbohydrates, less than 225m of dietary cholesterol, 20-35 gm of total fiber daily;Understanding of distribution of calorie intake throughout the day with the consumption of  4-5 meals/snacks    Hypertension  Yes    Intervention  Provide education on lifestyle modifcations including regular physical activity/exercise, weight management, moderate sodium restriction and increased consumption of fresh fruit, vegetables, and low fat dairy, alcohol moderation, and smoking cessation.;Monitor prescription use compliance.    Expected Outcomes  Short Term: Continued assessment and intervention until BP is < 140/35m HG in hypertensive participants. < 130/846mHG in hypertensive participants with diabetes, heart failure or chronic kidney disease.;Long Term: Maintenance of blood pressure at goal levels.    Lipids  Yes    Intervention  Provide education and support for participant on nutrition & aerobic/resistive exercise along with prescribed medications to achieve LDL <7046mHDL >29m36m  Expected Outcomes  Short Term: Participant states understanding of desired cholesterol values and is compliant with medications prescribed. Participant is following exercise prescription and nutrition guidelines.;Long Term: Cholesterol controlled with medications as prescribed, with individualized exercise RX and with personalized nutrition plan. Value goals: LDL < 70mg70mL > 40 mg.       Core Components/Risk Factors/Patient Goals Review:    Core Components/Risk Factors/Patient Goals at Discharge (Final Review):    ITP Comments: ITP Comments    Row Burns 09/03/19 1149 09/08/19 1347 09/09/19 1010 09/10/19 0619     ITP Comments  Completed virtual  orientation today.  EP evaluation is scheduled for 2/22 at 1pm.  [facility].  Completed 6MWT and gym orientation.  Initial ITP created and sent for review to Dr. Mark Emily Filbertical Director.  First full day of exercise!  Patient was oriented to gym and equipment including functions, settings, policies, and procedures.  Patient's individual exercise prescription and treatment plan were reviewed.  All starting workloads were established based on the results of the 6 minute walk test done at initial orientation visit.  The plan for exercise progression was also introduced and progression will be customized based on patient's performance and goals.  30 day chart review completed. ITP sent to Dr M MilZachery Dakinscal Director, for review,changes as needed and signature. New to program       Comments:

## 2019-09-11 ENCOUNTER — Other Ambulatory Visit: Payer: Self-pay

## 2019-09-11 ENCOUNTER — Encounter: Payer: Medicare Other | Admitting: *Deleted

## 2019-09-11 DIAGNOSIS — Z952 Presence of prosthetic heart valve: Secondary | ICD-10-CM | POA: Diagnosis not present

## 2019-09-11 NOTE — Progress Notes (Signed)
Daily Session Note  Patient Details  Name: Victoria Burns MRN: [id number] Date of Birth: 05-Mar-1941 Referring Provider:     Cardiac Rehab from 09/08/2019 in [facility]  Referring Provider  Ida Rogue MD  [attending: Dr. Harrell Gave End]      Encounter Date: 09/11/2019  Check In: Session Check In - 09/11/19 1022      Check-In   Supervising physician immediately available to respond to emergencies  See telemetry face sheet for immediately available ER MD    Location  [facility]-Cardiac & Pulmonary Rehab    Staff Present  Heath Lark, RN, BSN, CCRP;Melissa Argyle RDN, LDN;Joseph [employer], [place], ACSM CEP, Exercise Physiologist    Virtual Visit  No    Medication changes reported      No    Fall or balance concerns reported     No    Warm-up and Cool-down  Performed on first and last piece of equipment    Resistance Training Performed  Yes    VAD Patient?  No    PAD/SET Patient?  No      Pain Assessment   Currently in Pain?  No/denies          Social History   Tobacco Use  Smoking Status Former Smoker  . Types: Cigarettes  Smokeless Tobacco Never Used    Goals Met:  Independence with exercise equipment Exercise tolerated well No report of cardiac concerns or symptoms  Goals Unmet:  Not Applicable  Comments: Pt able to follow exercise prescription today without complaint.  Will continue to monitor for progression.    Dr. Emily Filbert is Medical Director for [place] and [facility].

## 2019-09-11 NOTE — Progress Notes (Signed)
Daily Session Note  Patient Details  Name: Victoria Burns MRN: [id number] Date of Birth: Nov 25, 1940 Referring Provider:     Cardiac Rehab from 09/08/2019 in [facility] Cardiac and Pulmonary Rehab  Referring Provider  Ida Rogue MD  [attending: Dr. Harrell Gave End]      Encounter Date: 09/11/2019  Check In: Session Check In - 09/11/19 1020      Check-In   Supervising physician immediately available to respond to emergencies  See telemetry face sheet for immediately available ER MD    Location  [facility]-Cardiac & Pul[facility] Rehab    Staff Present  Heath Lark, RN, BSN, CCRP;Melissa Caiola RDN, Rowe Pavy, BA, ACSM CEP, Exercise Physiologist;Joseph Hood RCP,RRT,BSRT    Virtual Visit  No    Medication changes reported      No    Fall or balance concerns reported     No    Warm-up and Cool-down  Performed on first and last piece of equipment    Resistance Training Performed  Yes    VAD Patient?  No    PAD/SET Patient?  No      Pain Assessment   Currently in Pain?  No/denies          Social History   Tobacco Use  Smoking Status Former Smoker  . Types: Cigarettes  Smokeless Tobacco Never Used    Goals Met:  Independence with exercise equipment Exercise tolerated well No report of cardiac concerns or symptoms  Goals Unmet:  Not Applicable  Comments: Pt able to follow exercise prescription today without complaint.  Will continue to monitor for progression.    Dr. Emily Filbert is Medical Director for [place] [facility].

## 2019-09-15 ENCOUNTER — Ambulatory Visit: Payer: Medicare Other | Admitting: Family Medicine

## 2019-09-15 ENCOUNTER — Other Ambulatory Visit: Payer: Self-pay

## 2019-09-15 ENCOUNTER — Telehealth: Payer: Self-pay | Admitting: *Deleted

## 2019-09-15 ENCOUNTER — Encounter: Payer: Self-pay | Admitting: *Deleted

## 2019-09-15 ENCOUNTER — Ambulatory Visit (INDEPENDENT_AMBULATORY_CARE_PROVIDER_SITE_OTHER): Payer: Medicare Other | Admitting: Family Medicine

## 2019-09-15 ENCOUNTER — Encounter: Payer: Self-pay | Admitting: Family Medicine

## 2019-09-15 VITALS — BP 140/80 | HR 67 | Temp 98.1°F | Ht 61.0 in | Wt 179.5 lb

## 2019-09-15 DIAGNOSIS — M25562 Pain in left knee: Secondary | ICD-10-CM | POA: Diagnosis not present

## 2019-09-15 DIAGNOSIS — M2392 Unspecified internal derangement of left knee: Secondary | ICD-10-CM

## 2019-09-15 DIAGNOSIS — Z952 Presence of prosthetic heart valve: Secondary | ICD-10-CM

## 2019-09-15 NOTE — Telephone Encounter (Signed)
Kymiah called to let us know that she needs to discharge from the program because of her knee pain.  Her orthopedist wants her to do PT prior to rehab.  We will discharge her at this time.

## 2019-09-15 NOTE — Progress Notes (Addendum)
Victoria Scheel T. Arretta Toenjes, MD Primary Care and Sports Medicine [facility] at [facility] [place], [address] Phone: [phone]  FAX: [phone]  Victoria Burns - 79 y.o. female  MRN [id number]  Date of Birth: 1941/04/16  Visit Date: 09/15/2019  PCP: Burnard Hawthorne, FNP  Referred by: Burnard Hawthorne, FNP  Chief Complaint  Patient presents with  . Follow-up    Left Knee    This visit occurred during the SARS-CoV-2 public health emergency.  Safety protocols were in place, including screening questions prior to the visit, additional usage of staff PPE, and extensive cleaning of exam room while observing appropriate contact time as indicated for disinfecting solutions.   Subjective:   Victoria Burns is a 79 y.o. very pleasant female patient with Body mass index is 33.92 kg/m. who presents with the following:  The patient continues to have some significant left-sided knee pain.  Last saw her initially she was using a walker.  At the time of her office visit, she was using a wheelchair.  At baseline she needs no assistive devices.  She has done some relatively intense cardio rehab per her and her husband, and this made her knee feel worse.  She is not having any significant effusion.  She does feel as if she may have some significant giving way.  She is not having any mechanical symptoms.  Again, she has no significant prior knee injuries, fractures, or operative intervention.  F/u OV knee:  Last felt kind of bad from cardio rehab.  Left knee, tib insuffic fracture  08/28/2019 Last OV with Owens Loffler, MD  She is a very nice-year-old 79 year old lady and she had a traumatic incident last Thursday where she hit her foot and ankle after she hit it on a coffee table.  At this point she has no pain in the foot or ankle but she does have some knee pain on the left side, the affected lower extremity.  Normally at baseline she is quite  active, but now she is using a walker and she is in a wheelchair today in the office.   She is not any prior significant injuries, fractures, dislocations or any operative intervention.  Today she is functionally impaired.   Additional history is provided by the patient's husband.  She complains mostly of pain in the back of her knee and some swelling.   No surgery.   Review of Systems is noted in the HPI, as appropriate   Objective:   BP 140/80   Pulse 67   Temp 98.1 F (36.7 C) (Temporal)   Ht 5\' 1"  (1.549 m)   Wt 179 lb 8 oz (81.4 kg)   SpO2 96%   BMI 33.92 kg/m   GEN: No acute distress; alert,appropriate. PULM: Breathing comfortably in no respiratory distress PSYCH: Normally interactive.    Left knee: Patient has a minimal effusion.  Full extension flexion to 110 degrees.  Stable to varus and valgus stress.  ACL and PCL are intact.  She does have pain on the medial joint line greater than the lateral joint line. She also has some pain at the tibial plateau. She also has some pain and bogginess at the Pes anserine bursa.  Patella and quad tendons are intact and nontender. Strength testing is 4+/5 in all directions.  There is some pain with resistance at extension and flexion at the knee.  Full range of motion at the hip.  Radiology: DG  Knee Complete 4 Views Left  Result Date: 08/28/2019 CLINICAL DATA:  Left knee pain. EXAM: LEFT KNEE - COMPLETE 4+ VIEW COMPARISON:  None. FINDINGS: No evidence of fracture, dislocation, or joint effusion. No evidence of arthropathy or other focal bone abnormality. Soft tissues are unremarkable. IMPRESSION: Negative. Electronically Signed   By: Marijo Conception M.D.   On: 08/28/2019 15:46    Assessment and Plan:     ICD-10-CM   1. Acute pain of left knee  M25.562 MR Knee Left  Wo Contrast  2. 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   Ongoing left knee pain with pain at the tibial plateau.  I would like to obtain an MRI of the left knee to evaluate for potential insufficiency fracture.  Certainly other pathology is possible including complex meniscal tear.  Damage to the chondral cartilage could certainly be greater than that on the plain films.  With the patient recently having a TAVR procedure, I am asking cardiology whether or not it is appropriate possible for her to get a MRI of the knee.  I appreciate their help and input.  Addendum, 09/17/2019: I have spoken to Cardiology, and TAVR is not a contraindication to MRI.  Obtain an MRI of the l knee, as above.  Follow-up: No follow-ups on file.  No orders of the defined types were placed in this encounter.  There are no discontinued medications. Orders Placed This Encounter  Procedures  . MR Knee Left  Wo Contrast    Signed,  Caprisha Bridgett T. Acelin Ferdig, MD   Outpatient Encounter Medications as of 09/15/2019  Medication Sig  . amiodarone (PACERONE) 200 MG tablet Take 1 tablet (200 mg) by mouth once daily  . amLODipine (NORVASC) 5 MG tablet Take 1 tablet (5 mg total) by mouth daily.  Marland Kitchen apixaban (ELIQUIS) 5 MG TABS tablet Take 1 tablet (5 mg total) by mouth 2 (two) times daily.  Marland Kitchen atorvastatin (LIPITOR) 80 MG tablet Take 1 tablet (80 mg total) by mouth daily at 6 PM.  . clopidogrel (PLAVIX) 75 MG tablet Take 1 tablet (75 mg total) by mouth daily.  . furosemide (LASIX) 20 MG tablet Take 1 tablet (20 mg total) by mouth daily.  . isosorbide mononitrate (IMDUR) 30 MG 24 hr tablet Take 1 tablet (30 mg total) by mouth 2 (two) times daily.  Marland Kitchen levothyroxine (SYNTHROID) 25 MCG tablet TAKE 1 TABLET BY MOUTH IN  THE MORNING ON AN EMPTY  STOMACH WITH WATER - NO  FOOD OR MEDICATIONS FOR 1/2 HOUR  . losartan (COZAAR) 100 MG tablet Take 0.5 tablets (50 mg total) by mouth daily.  . Misc Natural Products (FOCUSED MIND  PO) Take 1 tablet by mouth daily.   . Multiple Vitamins-Minerals (ZINC PO) Take 1 tablet by mouth daily.   . nitroGLYCERIN (NITROSTAT) 0.3 MG SL tablet DISSOLVE 1 TABLET UNDER THE TONGUE EVERY 5 MINUTES AS  NEEDED FOR CHEST PAIN. MAX  OF 3 TABLETS IN 15 MINUTES. CALL 911 IF PAIN PERSISTS.  [name] Glycol-Propyl Glycol (SYSTANE) 0.4-0.3 % SOLN Place 1 drop into both eyes 2 (two) times daily as needed (Dry eye).  . polyethylene glycol (MIRALAX / GLYCOLAX) 17 g packet Take 17 g by mouth daily as needed for moderate constipation.  . potassium chloride (KLOR-CON) 10 MEQ tablet  Take 1 tablet (10 mEq total) by mouth daily.  Marland Kitchen VITAMIN D PO Take 1 tablet by mouth daily.    No facility-administered encounter medications on file as of 09/15/2019.

## 2019-09-15 NOTE — Progress Notes (Signed)
Cardiac Individual Treatment Plan  Patient Details  Name: Victoria Burns MRN: [id number] Date of Birth: 20-Nov-1940 Referring Provider:     Cardiac Rehab from 09/08/2019 in [facility] Cardiac and Pulmonary Rehab  Referring Provider  Ida Rogue MD  [attending: Dr. Harrell Gave End]      Initial Encounter Date:    Cardiac Rehab from 09/08/2019 in [facility] Cardiac and Pulmonary Rehab  Date  09/08/19      Visit Diagnosis: S/P TAVR (transcatheter aortic valve replacement)  Patient's Home Medications on Admission:  Current Outpatient Medications:  .  amiodarone (PACERONE) 200 MG tablet, Take 1 tablet (200 mg) by mouth once daily, Disp: 90 tablet, Rfl: 1 .  amLODipine (NORVASC) 5 MG tablet, Take 1 tablet (5 mg total) by mouth daily., Disp: 90 tablet, Rfl: 3 .  apixaban (ELIQUIS) 5 MG TABS tablet, Take 1 tablet (5 mg total) by mouth 2 (two) times daily., Disp: 180 tablet, Rfl: 1 .  atorvastatin (LIPITOR) 80 MG tablet, Take 1 tablet (80 mg total) by mouth daily at 6 PM., Disp: 90 tablet, Rfl: 2 .  clopidogrel (PLAVIX) 75 MG tablet, Take 1 tablet (75 mg total) by mouth daily., Disp: 30 tablet, Rfl: 0 .  furosemide (LASIX) 20 MG tablet, Take 1 tablet (20 mg total) by mouth daily., Disp: 60 tablet, Rfl: 0 .  isosorbide mononitrate (IMDUR) 30 MG 24 hr tablet, Take 1 tablet (30 mg total) by mouth 2 (two) times daily., Disp: 180 tablet, Rfl: 2 .  levothyroxine (SYNTHROID) 25 MCG tablet, TAKE 1 TABLET BY MOUTH IN  THE MORNING ON AN EMPTY  STOMACH WITH WATER - NO  FOOD OR MEDICATIONS FOR 1/2 HOUR, Disp: 90 tablet, Rfl: 3 .  losartan (COZAAR) 100 MG tablet, Take 0.5 tablets (50 mg total) by mouth daily., Disp: 90 tablet, Rfl: 2 .  Misc Natural Products (FOCUSED MIND PO), Take 1 tablet by mouth daily. , Disp: , Rfl:  .  Multiple Vitamins-Minerals (ZINC PO), Take 1 tablet by mouth daily. , Disp: , Rfl:  .  nitroGLYCERIN (NITROSTAT) 0.3 MG SL tablet, DISSOLVE 1 TABLET UNDER THE TONGUE EVERY 5 MINUTES AS  NEEDED  FOR CHEST PAIN. MAX  OF 3 TABLETS IN 15 MINUTES. CALL 911 IF PAIN PERSISTS., Disp: 25 tablet, Rfl: 3 .  Polyethyl Glycol-Propyl Glycol (SYSTANE) 0.4-0.3 % SOLN, Place 1 drop into both eyes 2 (two) times daily as needed (Dry eye)., Disp: , Rfl:  .  polyethylene glycol (MIRALAX / GLYCOLAX) 17 g packet, Take 17 g by mouth daily as needed for moderate constipation., Disp: 14 each, Rfl: 0 .  potassium chloride (KLOR-CON) 10 MEQ tablet, Take 1 tablet (10 mEq total) by mouth daily., Disp: 60 tablet, Rfl: 0 .  VITAMIN D PO, Take 1 tablet by mouth daily. , Disp: , Rfl:   Past Medical History: Past Medical History:  Diagnosis Date  . Arthritis   . CAD (coronary artery disease)    a. s/p remote OM stenting; b. 05/2017 Cath ([facility] [place]): LAD mild irregs, LCX 5m OM2 40 ISR, RCA dominant, RPLV 50; c. 05/2019 Cath: LM nl, LAD mild-mod diff dzs, LCX 70, OM1 70-80p, 627mRCA mod diff dzs; c. 05/2019 PCI/orbital atherectomy: OM1 60/80 (2.5x18 & 2.75x12 Resolute Onyx DES), LCX 75ost (3.5x12 Resolute Onyx DES).  . Carotid artery disease ([facility]   a. s/p bilateral CEAs; b. 02/2018 Carotid U/S: 1-39% bilat ICA stenosis. F/u prn.  . Chronic back pain   . Hyperlipidemia   .  Hypertension   . Hypothyroidism   . Juxtafoveal telangietasis of both eyes   . PAD (peripheral artery disease) ([place])    a. 05/2019 Cath: found to have 80% ost RCIA stenosis and 100 RSFA.  . Paroxysmal atrial fibrillation ([place]) 07/2018  . RBBB   . S/P TAVR (transcatheter aortic valve replacement)   . Severe aortic stenosis    a. 06/2019 TAVR: 66m MDT Evolut Pro Plus TAVR; b. 06/2019 Echo: EF 60-65%, mod LVH. Gr1 DD. No rwma. AoV mean/peak grad 5.5/11.456mg. No AS.  . Marland Kitchenleep apnea    does not wear CPAP  . TIA (transient ischemic attack)     Tobacco Use: Social History   Tobacco Use  Smoking Status Former Smoker  . Types: Cigarettes  Smokeless Tobacco Never Used    Labs: Recent Review Flowsheet Data    Labs for ITP Cardiac  and Pulmonary Rehab Latest Ref Rng & Units 01/23/2018 06/20/2019 06/24/2019 09/03/2019   Cholestrol <200 mg/dL 123 - - 146   LDLCALC mg/dL (calc) 57 - - 74   HDL > OR = 50 mg/dL 48.10 - - 50   Trlycerides <150 mg/dL 90.0 - - 135   Hemoglobin A1c 4.8 - 5.6 % - 6.1(H) - -   PHART 7.350 - 7.450 - 7.406 - -   PCO2ART 32.0 - 48.0 mmHg - 40.0 - -   HCO3 20.0 - 28.0 mmol/L - 24.6 - -   TCO2 22 - 32 mmol/L - - 24 -   O2SAT % - 97.7 - -       Exercise Target Goals: Exercise Program Goal: Individual exercise prescription set using results from initial 6 min walk test and THRR while considering  patient's activity barriers and safety.   Exercise Prescription Goal: Initial exercise prescription builds to 30-45 minutes a day of aerobic activity, 2-3 days per week.  Home exercise guidelines will be given to patient during program as part of exercise prescription that the participant will acknowledge.  Activity Barriers & Risk Stratification: Activity Barriers & Cardiac Risk Stratification - 09/08/19 1348      Activity Barriers & Cardiac Risk Stratification   Activity Barriers  Arthritis;Back Problems;Neck/Spine Problems;Muscular Weakness;Deconditioning;Joint Problems;Other (comment);Assistive Device;Balance Concerns    Comments  tripped an hit left knee and tore cartildige in it, uses walker outside    Cardiac Risk Stratification  High       6 Minute Walk: 6 Minute Walk    Row Name 09/08/19 1348         6 Minute Walk   Phase  Initial     Distance  680 feet     Walk Time  6 minutes     # of Rest Breaks  0     MPH  1.29     METS  0.73     RPE  14     VO2 Peak  2.56     Symptoms  Yes (comment)     Comments  dizzy on turns, knee pain 3/10     Resting HR  58 bpm     Resting BP  126/72     Resting Oxygen Saturation   98 %     Exercise Oxygen Saturation  during 6 min walk  95 %     Max Ex. HR  82 bpm     Max Ex. BP  134/66     2 Minute Post BP  134/60        Oxygen Initial  Assessment:  Oxygen Re-Evaluation:   Oxygen Discharge (Final Oxygen Re-Evaluation):   Initial Exercise Prescription: Initial Exercise Prescription - 09/08/19 1300      Date of Initial Exercise RX and Referring Provider   Date  09/08/19    Referring Provider  Ida Rogue MD    attending: Dr. Harrell Gave End     Treadmill   MPH  1.2    Grade  0    Minutes  15    METs  1.92      Recumbant Bike   Level  1    RPM  50    Watts  5    Minutes  15    METs  1.9      NuStep   Level  1    SPM  80    Minutes  15    METs  1.9      Recumbant Elliptical   Level  1    RPM  50    Minutes  15    METs  1.9      Prescription Details   Frequency (times per week)  2    Duration  Progress to 30 minutes of continuous aerobic without signs/symptoms of physical distress      Intensity   THRR 40-80% of Max Heartrate  92-125    Ratings of Perceived Exertion  11-13    Perceived Dyspnea  0-4      Progression   Progression  Continue to progress workloads to maintain intensity without signs/symptoms of physical distress.      Resistance Training   Training Prescription  Yes    Weight  3 lb    Reps  10-15       Perform Capillary Blood Glucose checks as needed.  Exercise Prescription Changes: Exercise Prescription Changes    Row Name 09/08/19 1300 09/10/19 1300           Response to Exercise   Blood Pressure (Admit)  126/72  142/68      Blood Pressure (Exercise)  134/66  164/62      Blood Pressure (Exit)  134/60  114/56      Heart Rate (Admit)  58 bpm  70 bpm      Heart Rate (Exercise)  82 bpm  86 bpm      Heart Rate (Exit)  65 bpm  69 bpm      Oxygen Saturation (Admit)  98 %  --      Oxygen Saturation (Exercise)  95 %  --      Rating of Perceived Exertion (Exercise)  14  15      Symptoms  knee pain 3/10, dizzy on turns  fatigue on treadmill      Comments  walk test results  first full day of exercise      Duration  --  Progress to 30 minutes of  aerobic without  signs/symptoms of physical distress      Intensity  --  THRR unchanged        Progression   Progression  --  Continue to progress workloads to maintain intensity without signs/symptoms of physical distress.      Average METs  --  2.26        Resistance Training   Training Prescription  --  Yes      Weight  --  3 lb      Reps  --  10-15        Interval Training   Interval Training  --  No        Treadmill   MPH  --  1.2      Grade  --  0      Minutes  --  10 took rest breaks       METs  --  1.92        NuStep   Level  --  1      Minutes  --  15      METs  --  2.6         Exercise Comments: Exercise Comments    Row Name 09/09/19 1010           Exercise Comments  First full day of exercise!  Patient was oriented to gym and equipment including functions, settings, policies, and procedures.  Patient's individual exercise prescription and treatment plan were reviewed.  All starting workloads were established based on the results of the 6 minute walk test done at initial orientation visit.  The plan for exercise progression was also introduced and progression will be customized based on patient's performance and goals.          Exercise Goals and Review: Exercise Goals    Row Name 09/08/19 1351             Exercise Goals   Increase Physical Activity  Yes       Intervention  Provide advice, education, support and counseling about physical activity/exercise needs.;Develop an individualized exercise prescription for aerobic and resistive training based on initial evaluation findings, risk stratification, comorbidities and participant's personal goals.       Expected Outcomes  Short Term: Attend rehab on a regular basis to increase amount of physical activity.;Long Term: Add in home exercise to make exercise part of routine and to increase amount of physical activity.;Long Term: Exercising regularly at least 3-5 days a week.       Increase Strength and Stamina  Yes        Intervention  Provide advice, education, support and counseling about physical activity/exercise needs.;Develop an individualized exercise prescription for aerobic and resistive training based on initial evaluation findings, risk stratification, comorbidities and participant's personal goals.       Expected Outcomes  Short Term: Increase workloads from initial exercise prescription for resistance, speed, and METs.;Short Term: Perform resistance training exercises routinely during rehab and add in resistance training at home;Long Term: Improve cardiorespiratory fitness, muscular endurance and strength as measured by increased METs and functional capacity (6MWT)       Able to understand and use rate of perceived exertion (RPE) scale  Yes       Intervention  Provide education and explanation on how to use RPE scale       Expected Outcomes  Short Term: Able to use RPE daily in rehab to express subjective intensity level;Long Term:  Able to use RPE to guide intensity level when exercising independently       Able to understand and use Dyspnea scale  Yes       Intervention  Provide education and explanation on how to use Dyspnea scale       Expected Outcomes  Short Term: Able to use Dyspnea scale daily in rehab to express subjective sense of shortness of breath during exertion;Long Term: Able to use Dyspnea scale to guide intensity level when exercising independently       Knowledge and understanding of Target Heart Rate Range (THRR)  Yes       Intervention  Provide education  and explanation of THRR including how the numbers were predicted and where they are located for reference       Expected Outcomes  Short Term: Able to state/look up THRR;Short Term: Able to use daily as guideline for intensity in rehab;Long Term: Able to use THRR to govern intensity when exercising independently       Able to check pulse independently  Yes       Intervention  Provide education and demonstration on how to check pulse in  carotid and radial arteries.;Review the importance of being able to check your own pulse for safety during independent exercise       Expected Outcomes  Long Term: Able to check pulse independently and accurately;Short Term: Able to explain why pulse checking is important during independent exercise       Understanding of Exercise Prescription  Yes       Intervention  Provide education, explanation, and written materials on patient's individual exercise prescription       Expected Outcomes  Short Term: Able to explain program exercise prescription;Long Term: Able to explain home exercise prescription to exercise independently          Exercise Goals Re-Evaluation : Exercise Goals Re-Evaluation    Row Name 09/09/19 1010             Exercise Goal Re-Evaluation   Exercise Goals Review  Able to understand and use rate of perceived exertion (RPE) scale;Knowledge and understanding of Target Heart Rate Range (THRR);Able to check pulse independently;Understanding of Exercise Prescription       Comments  Reviewed RPE scale, THR and program prescription with pt today.  Pt voiced understanding and was given a copy of goals to take home.       Expected Outcomes  Short: Use RPE daily to regulate intensity. Long: Follow program prescription in THR.          Discharge Exercise Prescription (Final Exercise Prescription Changes): Exercise Prescription Changes - 09/10/19 1300      Response to Exercise   Blood Pressure (Admit)  142/68    Blood Pressure (Exercise)  164/62    Blood Pressure (Exit)  114/56    Heart Rate (Admit)  70 bpm    Heart Rate (Exercise)  86 bpm    Heart Rate (Exit)  69 bpm    Rating of Perceived Exertion (Exercise)  15    Symptoms  fatigue on treadmill    Comments  first full day of exercise    Duration  Progress to 30 minutes of  aerobic without signs/symptoms of physical distress    Intensity  THRR unchanged      Progression   Progression  Continue to progress workloads to  maintain intensity without signs/symptoms of physical distress.    Average METs  2.26      Resistance Training   Training Prescription  Yes    Weight  3 lb    Reps  10-15      Interval Training   Interval Training  No      Treadmill   MPH  1.2    Grade  0    Minutes  10   took rest breaks    METs  1.92      NuStep   Level  1    Minutes  15    METs  2.6       Nutrition:  Target Goals: Understanding of nutrition guidelines, daily intake of sodium <1532m, cholesterol <2024m calories 30% from  fat and 7% or less from saturated fats, daily to have 5 or more servings of fruits and vegetables.  Biometrics: Pre Biometrics - 09/08/19 1351      Pre Biometrics   Height  5' 0.1" (1.527 m)    Weight  179 lb 14.4 oz (81.6 kg)    BMI (Calculated)  35    Single Leg Stand  3.25 seconds        Nutrition Therapy Plan and Nutrition Goals:   Nutrition Assessments: Nutrition Assessments - 09/03/19 1340      MEDFICTS Scores   Pre Score  15       Nutrition Goals Re-Evaluation:   Nutrition Goals Discharge (Final Nutrition Goals Re-Evaluation):   Psychosocial: Target Goals: Acknowledge presence or absence of significant depression and/or stress, maximize coping skills, provide positive support system. Participant is able to verbalize types and ability to use techniques and skills needed for reducing stress and depression.   Initial Review & Psychosocial Screening: Initial Psych Review & Screening - 09/03/19 1046      Initial Review   Current issues with  Current Stress Concerns    Source of Stress Concerns  Unable to perform yard/household activities    Comments  Worried about potential ice storm, surgery restrictions      Family Dynamics   Good Support System?  Yes   husband, two daughters [place], [place] and [place], [place])     Barriers   Psychosocial barriers to participate in program  The patient should benefit from training in stress management and  relaxation.;Psychosocial barriers identified (see note)      Screening Interventions   Interventions  Encouraged to exercise;To provide support and resources with identified psychosocial needs;Provide feedback about the scores to participant    Expected Outcomes  Short Term goal: Utilizing psychosocial counselor, staff and physician to assist with identification of specific Stressors or current issues interfering with healing process. Setting desired goal for each stressor or current issue identified.;Long Term Goal: Stressors or current issues are controlled or eliminated.;Long Term goal: The participant improves quality of Life and PHQ9 Scores as seen by post scores and/or verbalization of changes;Short Term goal: Identification and review with participant of any Quality of Life or Depression concerns found by scoring the questionnaire.       Quality of Life Scores:  Quality of Life - 09/03/19 1341      Quality of Life   Select  Quality of Life      Quality of Life Scores   Health/Function Pre  25.4 %    Socioeconomic Pre  21.93 %    Psych/Spiritual Pre  24.92 %    Family Pre  27.6 %    GLOBAL Pre  24.82 %      Scores of 19 and below usually indicate a poorer quality of life in these areas.  A difference of  2-3 points is a clinically meaningful difference.  A difference of 2-3 points in the total score of the Quality of Life Index has been associated with significant improvement in overall quality of life, self-image, physical symptoms, and general health in studies assessing change in quality of life.  PHQ-9: Recent Review Flowsheet Data    Depression screen [facility] 2/9 09/08/2019 08/18/2019 08/18/2019   Decreased Interest 3 0 0   Down, Depressed, Hopeless 0 0 0   PHQ - 2 Score 3 0 0   Altered sleeping 1 2 -   Tired, decreased energy 1 1 -   Change in  appetite 1 0 -   Feeling bad or failure about yourself  0 0 -   Trouble concentrating 0 0 -   Moving slowly or fidgety/restless 0 0 -    Suicidal thoughts 0 0 -   PHQ-9 Score 6 3 -   Difficult doing work/chores Somewhat difficult Not difficult at all -     Interpretation of Total Score  Total Score Depression Severity:  1-4 = Minimal depression, 5-9 = Mild depression, 10-14 = Moderate depression, 15-19 = Moderately severe depression, 20-27 = Severe depression   Psychosocial Evaluation and Intervention: Psychosocial Evaluation - 09/03/19 1144      Psychosocial Evaluation & Interventions   Interventions  Encouraged to exercise with the program and follow exercise prescription;Stress management education    Comments  Shaleta is coming into cardiac rehab after TAVR.  She has also recently changed from Dr. Rockey Situ to Dr. Saunders Revel as her primary cardiologist.  She has a history of a TIA and known to be clumbsy as well.  She has some arthritis in her back and PAD that limit her activity.  She has been using a walker outside. She has a good support system in her husband. They have two daughters and one is an Therapist, sports and they both call regularly to check in on them.  She is eager to be able to get back on her feet again and regain her strength and confidence to be able to go!!    Expected Outcomes  Short: Attend rehab to regain strength.  Long: Continue to stay positive.    Continue Psychosocial Services   Follow up required by staff       Psychosocial Re-Evaluation:   Psychosocial Discharge (Final Psychosocial Re-Evaluation):   Vocational Rehabilitation: Provide vocational rehab assistance to qualifying candidates.   Vocational Rehab Evaluation & Intervention: Vocational Rehab - 09/03/19 1046      Initial Vocational Rehab Evaluation & Intervention   Assessment shows need for Vocational Rehabilitation  No   retired      Education: Education Goals: Education classes will be provided on a variety of topics geared toward better understanding of heart health and risk factor modification. Participant will state understanding/return  demonstration of topics presented as noted by education test scores.  Learning Barriers/Preferences: Learning Barriers/Preferences - 09/03/19 1045      Learning Barriers/Preferences   Learning Barriers  Sight   glasses   Learning Preferences  Written Material;Individual Instruction       Education Topics:  AED/CPR: - Group verbal and written instruction with the use of models to demonstrate the basic use of the AED with the basic ABC's of resuscitation.   General Nutrition Guidelines/Fats and Fiber: -Group instruction provided by verbal, written material, models and posters to present the general guidelines for heart healthy nutrition. Gives an explanation and review of dietary fats and fiber.   Controlling Sodium/Reading Food Labels: -Group verbal and written material supporting the discussion of sodium use in heart healthy nutrition. Review and explanation with models, verbal and written materials for utilization of the food label.   Exercise Physiology & General Exercise Guidelines: - Group verbal and written instruction with models to review the exercise physiology of the cardiovascular system and associated critical values. Provides general exercise guidelines with specific guidelines to those with heart or lung disease.    Aerobic Exercise & Resistance Training: - Gives group verbal and written instruction on the various components of exercise. Focuses on aerobic and resistive training programs and the benefits of  this training and how to safely progress through these programs..   Flexibility, Balance, Mind/Body Relaxation: Provides group verbal/written instruction on the benefits of flexibility and balance training, including mind/body exercise modes such as yoga, pilates and tai chi.  Demonstration and skill practice provided.   Stress and Anxiety: - Provides group verbal and written instruction about the health risks of elevated stress and causes of high stress.   Discuss the correlation between heart/lung disease and anxiety and treatment options. Review healthy ways to manage with stress and anxiety.   Depression: - Provides group verbal and written instruction on the correlation between heart/lung disease and depressed mood, treatment options, and the stigmas associated with seeking treatment.   Anatomy & Physiology of the Heart: - Group verbal and written instruction and models provide basic cardiac anatomy and physiology, with the coronary electrical and arterial systems. Review of Valvular disease and Heart Failure   Cardiac Procedures: - Group verbal and written instruction to review commonly prescribed medications for heart disease. Reviews the medication, class of the drug, and side effects. Includes the steps to properly store meds and maintain the prescription regimen. (beta blockers and nitrates)   Cardiac Medications I: - Group verbal and written instruction to review commonly prescribed medications for heart disease. Reviews the medication, class of the drug, and side effects. Includes the steps to properly store meds and maintain the prescription regimen.   Cardiac Medications II: -Group verbal and written instruction to review commonly prescribed medications for heart disease. Reviews the medication, class of the drug, and side effects. (all other drug classes)    Go Sex-Intimacy & Heart Disease, Get SMART - Goal Setting: - Group verbal and written instruction through game format to discuss heart disease and the return to sexual intimacy. Provides group verbal and written material to discuss and apply goal setting through the application of the S.M.A.R.T. Method.   Other Matters of the Heart: - Provides group verbal, written materials and models to describe Stable Angina and Peripheral Artery. Includes description of the disease process and treatment options available to the cardiac patient.   Exercise & Equipment Safety: -  Individual verbal instruction and demonstration of equipment use and safety with use of the equipment.   Cardiac Rehab from 09/08/2019 in [facility] Cardiac and Pulmonary Rehab  Date  09/08/19  Educator  [facility]  Instruction Review Code  1- Verbalizes Understanding      Infection Prevention: - Provides verbal and written material to individual with discussion of infection control including proper hand washing and proper equipment cleaning during exercise session.   Cardiac Rehab from 09/08/2019 in [facility] Cardiac and Pulmonary Rehab  Date  09/08/19  Educator  [facility]  Instruction Review Code  1- Verbalizes Understanding      Falls Prevention: - Provides verbal and written material to individual with discussion of falls prevention and safety.   Cardiac Rehab from 09/08/2019 in [facility] Cardiac and Pulmonary Rehab  Date  09/08/19  Educator  [facility]  Instruction Review Code  1- Verbalizes Understanding      Diabetes: - Individual verbal and written instruction to review signs/symptoms of diabetes, desired ranges of glucose level fasting, after meals and with exercise. Acknowledge that pre and post exercise glucose checks will be done for 3 sessions at entry of program.   Know Your Numbers and Risk Factors: -Group verbal and written instruction about important numbers in your health.  Discussion of what are risk factors and how they play a role in the disease  process.  Review of Cholesterol, Blood Pressure, Diabetes, and BMI and the role they play in your overall health.   Sleep Hygiene: -Provides group verbal and written instruction about how sleep can affect your health.  Define sleep hygiene, discuss sleep cycles and impact of sleep habits. Review good sleep hygiene tips.    Other: -Provides group and verbal instruction on various topics (see comments)   Knowledge Questionnaire Score: Knowledge Questionnaire Score - 09/03/19 1340      Knowledge Questionnaire Score   Pre Score  22/26 Education Focus:  Exercise, Depression, A&P       Core Components/Risk Factors/Patient Goals at Admission: Personal Goals and Risk Factors at Admission - 09/08/19 1352      Core Components/Risk Factors/Patient Goals on Admission    Weight Management  Yes;Weight Loss;Obesity    Intervention  Weight Management: Develop a combined nutrition and exercise program designed to reach desired caloric intake, while maintaining appropriate intake of nutrient and fiber, sodium and fats, and appropriate energy expenditure required for the weight goal.;Weight Management: Provide education and appropriate resources to help participant work on and attain dietary goals.;Weight Management/Obesity: Establish reasonable short term and long term weight goals.;Obesity: Provide education and appropriate resources to help participant work on and attain dietary goals.    Admit Weight  179 lb 14.4 oz (81.6 kg)    Goal Weight: Short Term  175 lb (79.4 kg)    Goal Weight: Long Term  170 lb (77.1 kg)    Expected Outcomes  Short Term: Continue to assess and modify interventions until short term weight is achieved;Long Term: Adherence to nutrition and physical activity/exercise program aimed toward attainment of established weight goal;Weight Loss: Understanding of general recommendations for a balanced deficit meal plan, which promotes 1-2 lb weight loss per week and includes a negative energy balance of [id number] kcal/d;Understanding recommendations for meals to include 15-35% energy as protein, 25-35% energy from fat, 35-60% energy from carbohydrates, less than 265m of dietary cholesterol, 20-35 gm of total fiber daily;Understanding of distribution of calorie intake throughout the day with the consumption of 4-5 meals/snacks    Hypertension  Yes    Intervention  Provide education on lifestyle modifcations including regular physical activity/exercise, weight management, moderate sodium restriction and increased consumption of fresh fruit,  vegetables, and low fat dairy, alcohol moderation, and smoking cessation.;Monitor prescription use compliance.    Expected Outcomes  Short Term: Continued assessment and intervention until BP is < 140/971mHG in hypertensive participants. < 130/8055mG in hypertensive participants with diabetes, heart failure or chronic kidney disease.;Long Term: Maintenance of blood pressure at goal levels.    Lipids  Yes    Intervention  Provide education and support for participant on nutrition & aerobic/resistive exercise along with prescribed medications to achieve LDL <71m43mDL >40mg65m Expected Outcomes  Short Term: Participant states understanding of desired cholesterol values and is compliant with medications prescribed. Participant is following exercise prescription and nutrition guidelines.;Long Term: Cholesterol controlled with medications as prescribed, with individualized exercise RX and with personalized nutrition plan. Value goals: LDL < 71mg,62m > 40 mg.       Core Components/Risk Factors/Patient Goals Review:    Core Components/Risk Factors/Patient Goals at Discharge (Final Review):    ITP Comments: ITP Comments    Row Name 09/03/19 1149 09/08/19 1347 09/09/19 1010 09/10/19 0619 09/15/19 1529   ITP Comments  Completed virtual orientation today.  EP evaluation is scheduled for 2/22 at 1pm.  Documentation for diagnosis  can be found in [facility] encounter 06/24/19.  Completed 6MWT and gym orientation.  Initial ITP created and sent for review to Dr. Emily Filbert, Medical Director.  First full day of exercise!  Patient was oriented to gym and equipment including functions, settings, policies, and procedures.  Patient's individual exercise prescription and treatment plan were reviewed.  All starting workloads were established based on the results of the 6 minute walk test done at initial orientation visit.  The plan for exercise progression was also introduced and progression will be customized based on  patient's performance and goals.  30 day chart review completed. ITP sent to Dr Zachery Dakins Medical Director, for review,changes as needed and signature. New to program  Donatella called to let us know that she needs to discharge from the program because of her knee pain.  Her orthopedist wants her to do PT prior to rehab.  We will discharge her at this time.      Comments: Discharge ITP Completed 2 visits

## 2019-09-15 NOTE — Patient Instructions (Signed)
I will let you know about MRI after I speak to the heart doctors

## 2019-09-17 NOTE — Addendum Note (Signed)
Addended by: Owens Loffler on: 09/17/2019 02:03 PM   Modules accepted: Orders

## 2019-09-29 ENCOUNTER — Telehealth: Payer: Self-pay | Admitting: *Deleted

## 2019-09-29 NOTE — Telephone Encounter (Signed)
Tylenol extra strength, 2 tablets 3 times a day

## 2019-09-29 NOTE — Telephone Encounter (Signed)
Patient's husband called stating that she is having severe knee pain and wants to know what she can take for the pain? Patient is scheduled for an MRI 10/11/19. Pharmacy  [facility]

## 2019-09-29 NOTE — Telephone Encounter (Signed)
Mr. Fertitta notified as instructed by telephone.

## 2019-09-29 NOTE — Telephone Encounter (Signed)
Spoke with Victoria Burns.  He states Victoria Burns doesn't want anything strong because it makes her groggy.  She is wanting to know how much Tylenol she can take in a day's time.  Please advise.

## 2019-09-29 NOTE — Telephone Encounter (Signed)
Please call  Her chart notes an allergy or intolerance to codeine.  I need to know exactly what this means and if she can take other pain medication.  Generally, I do give strong narcotics to anyone 52 - particularly with knee injuries.  The risk of a fall increases a lot.

## 2019-10-03 ENCOUNTER — Other Ambulatory Visit: Payer: Self-pay

## 2019-10-03 ENCOUNTER — Ambulatory Visit
Admission: RE | Admit: 2019-10-03 | Discharge: 2019-10-03 | Disposition: A | Discharge status: Home / Self Care | Payer: Medicare Other | Source: Ambulatory Visit | Attending: Family Medicine | Admitting: Family Medicine

## 2019-10-03 DIAGNOSIS — M25562 Pain in left knee: Secondary | ICD-10-CM | POA: Diagnosis not present

## 2019-10-03 DIAGNOSIS — M2392 Unspecified internal derangement of left knee: Secondary | ICD-10-CM

## 2019-10-03 DIAGNOSIS — Z952 Presence of prosthetic heart valve: Secondary | ICD-10-CM

## 2019-10-06 ENCOUNTER — Other Ambulatory Visit: Payer: Self-pay | Admitting: Family Medicine

## 2019-10-06 ENCOUNTER — Telehealth: Payer: Self-pay

## 2019-10-06 DIAGNOSIS — M84469A Pathological fracture, unspecified tibia and fibula, initial encounter for fracture: Secondary | ICD-10-CM

## 2019-10-06 DIAGNOSIS — S83282A Other tear of lateral meniscus, current injury, left knee, initial encounter: Secondary | ICD-10-CM

## 2019-10-06 DIAGNOSIS — M1712 Unilateral primary osteoarthritis, left knee: Secondary | ICD-10-CM

## 2019-10-06 NOTE — Telephone Encounter (Signed)
Pt left v/m requesting cb with better understanding of 10/03/19 MRI results of lt knee and how to proceed.  Per Dr Serita Grit note on 10/06/19 he will be consulting with surgeon for opinion and assistance. FYI to DR Copland.

## 2019-10-06 NOTE — Telephone Encounter (Signed)
I am a bit confused.  I spoke to them for about 10-15 minutes.  Do they want to speak to me more? If so, I will have to do this in the evening.

## 2019-10-06 NOTE — Telephone Encounter (Signed)
I would say yes, since she seems confused on how she is suppose to proceed or let me know what she is suppose to do next and I can call her.

## 2019-10-06 NOTE — Telephone Encounter (Signed)
Can we see if we can get her seen sometime this week, non-emergently  [facility], [place] [facility] [place]

## 2019-10-08 DIAGNOSIS — M1712 Unilateral primary osteoarthritis, left knee: Secondary | ICD-10-CM | POA: Diagnosis not present

## 2019-10-11 ENCOUNTER — Other Ambulatory Visit: Payer: Medicare Other

## 2019-10-15 ENCOUNTER — Ambulatory Visit (INDEPENDENT_AMBULATORY_CARE_PROVIDER_SITE_OTHER): Payer: Medicare Other | Admitting: Internal Medicine

## 2019-10-15 ENCOUNTER — Other Ambulatory Visit: Payer: Self-pay

## 2019-10-15 ENCOUNTER — Encounter: Payer: Self-pay | Admitting: Internal Medicine

## 2019-10-15 VITALS — BP 134/72 | HR 56 | Ht 61.0 in | Wt 177.5 lb

## 2019-10-15 DIAGNOSIS — I25118 Atherosclerotic heart disease of native coronary artery with other forms of angina pectoris: Secondary | ICD-10-CM | POA: Diagnosis not present

## 2019-10-15 DIAGNOSIS — I480 Paroxysmal atrial fibrillation: Secondary | ICD-10-CM

## 2019-10-15 DIAGNOSIS — Z952 Presence of prosthetic heart valve: Secondary | ICD-10-CM

## 2019-10-15 DIAGNOSIS — I10 Essential (primary) hypertension: Secondary | ICD-10-CM

## 2019-10-15 DIAGNOSIS — E785 Hyperlipidemia, unspecified: Secondary | ICD-10-CM | POA: Diagnosis not present

## 2019-10-15 NOTE — Patient Instructions (Signed)
Medication Instructions:  Your physician recommends that you continue on your current medications as directed. Please refer to the Current Medication list given to you today.  *If you need a refill on your cardiac medications before your next appointment, please call your pharmacy*   Follow-Up: You have been referred to Electrophysiology - Dr Caryl Comes - for management of Paroxysmal Atrial Fibrillation.   At [facility], you and your health needs are our priority.  As part of our continuing mission to provide you with exceptional heart care, we have created designated Provider Care Teams.  These Care Teams include your primary Cardiologist (physician) and Advanced Practice Providers (APPs -  Physician Assistants and Nurse Practitioners) who all work together to provide you with the care you need, when you need it.  We recommend signing up for the patient portal called "MyChart".  Sign up information is provided on this After Visit Summary.  MyChart is used to connect with patients for Virtual Visits (Telemedicine).  Patients are able to view lab/test results, encounter notes, upcoming appointments, etc.  Non-urgent messages can be sent to your provider as well.   To learn more about what you can do with MyChart, go to [email or web address].    Your next appointment:   4 month(s)  The format for your next appointment:   In Person  Provider:    You may see Nelva Bush, MD or one of the following Advanced Practice Providers on your designated Care Team:    Murray Hodgkins, NP  Christell Faith, PA-C  Marrianne Mood, PA-C

## 2019-10-15 NOTE — Progress Notes (Signed)
Follow-up Outpatient Visit Date: 10/15/2019  Primary Care Provider: Burnard Hawthorne, FNP [address] [place] [address]  Chief Complaint: Follow-up atrial fibrillation, coronary artery disease, and aortic stenosis.  HPI:  Victoria Burns is a 79 y.o. 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  I last saw her in late January after hospitalization in mid January with atrial fibrillation with rapid ventricular response.  She had converted to sinus rhythm with amiodarone.  She was tolerating apixaban and amiodarone well.  Today, [name] reports that she is feeling well.  She denies chest pain, shortness of breath, palpitations, lightheadedness, edema, and claudication.  She tripped and fell since our last visit, injuring her left knee.  She is following with orthopedics.  She is tolerating her medications well and remains on apixaban and clopidogrel.  --------------------------------------------------------------------------------------------------  Past Medical History:  Diagnosis Date  . Arthritis   . CAD (coronary artery disease)    a. s/p remote OM stenting; b. 05/2017 Cath ([facility]): LAD mild irregs, LCX 48m, OM2 40 ISR, RCA dominant, RPLV 50; c. 05/2019 Cath: LM nl, LAD mild-mod diff dzs, LCX 70, OM1 70-80p, 60m, RCA mod diff dzs; c. 05/2019 PCI/orbital atherectomy: OM1 60/80 (2.5x18 &  2.75x12 Resolute Onyx DES), LCX 75ost (3.5x12 Resolute Onyx DES).  . Carotid artery disease ([place])    a. s/p bilateral CEAs; b. 02/2018 Carotid U/S: 1-39% bilat ICA stenosis. F/u prn.  . Chronic back pain   . Hyperlipidemia   . Hypertension   . Hypothyroidism   . Juxtafoveal telangietasis of both eyes   . PAD (peripheral artery disease) ([place])    a. 05/2019 Cath: found to have 80% ost RCIA stenosis and 100 RSFA.  . Paroxysmal atrial fibrillation ([place]) 07/2018  . RBBB   . S/P TAVR (transcatheter aortic valve replacement)   . Severe aortic stenosis    a. 06/2019 TAVR: 61mm MDT Evolut Pro Plus TAVR; b. 06/2019 Echo: EF 60-65%, mod LVH. Gr1 DD. No rwma. AoV mean/peak grad 5.5/11.65mmHg. No AS.  Marland Kitchen Sleep apnea    does not wear CPAP  . TIA (transient ischemic attack)    Past Surgical History:  Procedure Laterality Date  . AORTIC VALVE REPLACEMENT    . BACK SURGERY  08/2016  . BREAST SURGERY     Biopsy  . CARDIAC CATHETERIZATION    . CAROTID ENDARTERECTOMY Bilateral   . CHOLECYSTECTOMY    . CORONARY ANGIOGRAPHY N/A 06/05/2019   Procedure: CORONARY ANGIOGRAPHY (CATH LAB);  Surgeon: Burnell Blanks, MD;  Location: [place] [facility];  Service: Cardiovascular;  Laterality: N/A;  . CORONARY ANGIOPLASTY    . CORONARY ATHERECTOMY N/A 06/06/2019   Procedure: CORONARY ATHERECTOMY;  Surgeon: Martinique, Peter M, MD;  Location: [place] [facility];  Service: Cardiovascular;  Laterality: N/A;  . RIGHT/LEFT HEART CATH AND CORONARY ANGIOGRAPHY Bilateral 05/23/2019   Procedure: RIGHT/LEFT HEART CATH AND CORONARY ANGIOGRAPHY;  Surgeon: Minna Merritts, MD;  Location: [place] [facility];  Service: Cardiovascular;  Laterality:  Bilateral;  . TEE WITHOUT CARDIOVERSION N/A 06/24/2019   Procedure: TRANSESOPHAGEAL ECHOCARDIOGRAM (TEE);  Surgeon: Burnell Blanks, MD;  Location: [place];  Service: Open Heart Surgery;  Laterality: N/A;  . TONSILLECTOMY AND ADENOIDECTOMY      Current Meds  Medication  Sig  . amiodarone (PACERONE) 200 MG tablet Take 1 tablet (200 mg) by mouth once daily  . amLODipine (NORVASC) 5 MG tablet Take 1 tablet (5 mg total) by mouth daily.  Marland Kitchen apixaban (ELIQUIS) 5 MG TABS tablet Take 1 tablet (5 mg total) by mouth 2 (two) times daily.  Marland Kitchen atorvastatin (LIPITOR) 80 MG tablet Take 1 tablet (80 mg total) by mouth daily at 6 PM.  . clopidogrel (PLAVIX) 75 MG tablet Take 1 tablet (75 mg total) by mouth daily.  . furosemide (LASIX) 20 MG tablet Take 1 tablet (20 mg total) by mouth daily.  . isosorbide mononitrate (IMDUR) 30 MG 24 hr tablet Take 1 tablet (30 mg total) by mouth 2 (two) times daily.  Marland Kitchen levothyroxine (SYNTHROID) 25 MCG tablet TAKE 1 TABLET BY MOUTH IN  THE MORNING ON AN EMPTY  STOMACH WITH WATER - NO  FOOD OR MEDICATIONS FOR 1/2 HOUR  . losartan (COZAAR) 100 MG tablet Take 0.5 tablets (50 mg total) by mouth daily.  . Misc Natural Products (FOCUSED MIND PO) Take 1 tablet by mouth daily.   . Multiple Vitamins-Minerals (ZINC PO) Take 1 tablet by mouth daily.   . nitroGLYCERIN (NITROSTAT) 0.3 MG SL tablet DISSOLVE 1 TABLET UNDER THE TONGUE EVERY 5 MINUTES AS  NEEDED FOR CHEST PAIN. MAX  OF 3 TABLETS IN 15 MINUTES. CALL 911 IF PAIN PERSISTS.  [name] Glycol-Propyl Glycol (SYSTANE) 0.4-0.3 % SOLN Place 1 drop into both eyes 2 (two) times daily as needed (Dry eye).  . polyethylene glycol (MIRALAX / GLYCOLAX) 17 g packet Take 17 g by mouth daily as needed for moderate constipation.  . potassium chloride (KLOR-CON) 10 MEQ tablet Take 1 tablet (10 mEq total) by mouth daily.  Marland Kitchen VITAMIN D PO Take 1 tablet by mouth daily.     Allergies: Iodinated diagnostic agents, Codeine, and Penicillins  Social History   Tobacco Use  . Smoking status: Former Smoker    Types: Cigarettes  . Smokeless tobacco: Never Used  Substance Use Topics  . Alcohol use: No  . Drug use: No    Family History  Problem Relation Age of Onset  . Heart disease Mother   . Hyperlipidemia Mother     . Hypertension Mother   . Alcohol abuse Father   . COPD Father   . Heart attack Father   . Arthritis Sister   . Hypertension Sister   . Cancer Sister   . COPD Sister   . Heart attack Sister   . Heart disease Sister   . Hypertension Sister     Review of Systems: A 12-system review of systems was performed and was negative except as noted in the HPI.  --------------------------------------------------------------------------------------------------  Physical Exam: BP 134/72 (BP Location: Left Arm, Patient Position: Sitting, Cuff Size: Large)   Pulse (!) 56   Ht 5\' 1"  (1.549 m)   Wt 177 lb 8 oz (80.5 kg)   SpO2 95%   BMI 33.54 kg/m   General:  NAD. HEENT: No conjunctival pallor or scleral icterus. Facemask in place. Neck: Supple without lymphadenopathy, thyromegaly, JVD, or HJR. Lungs: Normal work of breathing. Clear to auscultation bilaterally without wheezes or crackles. Heart: Regular rate and rhythm with  2/6 systolic murmur. Abd: Bowel sounds present. Soft, NT/ND without hepatosplenomegaly Ext: No lower extremity edema. Skin: Warm and dry without rash.  EKG:  Sinus bradycardia (HR 56 bpm) with RBBB.  Lab Results  Component Value Date   WBC 6.1 08/02/2019   HGB 12.7 08/02/2019   HCT 39.3 08/02/2019   MCV 100.0 08/02/2019   PLT 248 08/02/2019    Lab Results  Component Value Date   NA 141 08/02/2019   K 3.9 08/02/2019   CL 109 08/02/2019   CO2 25 08/02/2019   BUN 15 08/02/2019   CREATININE 0.75 08/02/2019   GLUCOSE 101 (H) 08/02/2019   ALT 21 08/02/2019    Lab Results  Component Value Date   CHOL 146 09/03/2019   HDL 50 09/03/2019   LDLCALC 74 09/03/2019   TRIG 135 09/03/2019   CHOLHDL 2.9 09/03/2019    --------------------------------------------------------------------------------------------------  ASSESSMENT AND PLAN: Paroxysmal atrial fibrillation: Ms. Spinnato has maintained sinus rhythm and is tolerating amiodarone well.  I am concerned  about potential side-effects/toxicities with indefinite amiodarone use, as well as recurrent atrial fibrillation, which was very symptomatic, should amiodarone be stopped.  We have agreed to refer Ms. Nickelson to EP to discuss continued rhythm control options for her a-fib.  She will remain on indefinite anticoagulation.  Defer addition of AV nodal blocking agents, given sinus bradycardia with underlying RBBB and transient complete AV block following TAVR.  Coronary artery disease with stable angina: No angina reported on antianginal regimen consistent of amlodipine and isosorbide mononitrate.  We will plan to continue apixaban and clopidogrel for at least 6 months from the time of PCI on 06/06/2019, after which time we could consider switching clopidogrel to aspirin 81 mg daily.  We will continue atorvastatin 80 mg daily for secondary prevention.  Aortic stenosis status post TAVR: Ms. Cobos has recovered well and denies any limitations in her activities.  Continue anticoagulation/antiplatelet therapy.  She will also need SBE prophylaxis for dental procedures.  Continue to follow with the valve team, as previously discussed.  Hypertension: BP reasonable.  No medication changes at this time.  Hyperlipidemia: Continue atorvastatin 80 mg daily.  Follow-up: Return to clinic in 4 months.  Nelva Bush, MD 10/16/2019 8:25 PM

## 2019-10-16 ENCOUNTER — Encounter: Payer: Self-pay | Admitting: Internal Medicine

## 2019-10-16 DIAGNOSIS — I480 Paroxysmal atrial fibrillation: Secondary | ICD-10-CM | POA: Insufficient documentation

## 2019-10-20 ENCOUNTER — Telehealth: Payer: Self-pay | Admitting: Internal Medicine

## 2019-10-20 DIAGNOSIS — M1712 Unilateral primary osteoarthritis, left knee: Secondary | ICD-10-CM | POA: Diagnosis not present

## 2019-10-20 NOTE — Telephone Encounter (Signed)
° °  [facility] Pre-operative Risk Assessment    Request for surgical clearance:  1. What type of surgery is being performed? Left partial knee replacement  2. When is this surgery scheduled? TBD  3. What type of clearance is required (medical clearance vs. Pharmacy clearance to hold med vs. Both)? both  4. Are there any medications that need to be held prior to surgery and how long? Not listed, please provide instructions if needed  5. Practice name and name of physician performing surgery? [facility], Dr Johnny Bridge   6. What is your office phone number [phone]   7.   What is your office fax number [phone] Attn: Sherri  8.   Anesthesia type (None, local, MAC, general) ? Not listed    Ace Gins 10/20/2019, 4:07 PM  _________________________________________________________________   (provider comments below)

## 2019-10-21 ENCOUNTER — Telehealth: Payer: Self-pay

## 2019-10-21 NOTE — Telephone Encounter (Signed)
We received a surgical clearance form from [facility] for pt to have knee surgery. I called patient to try to get her scheduled for in person appointment. Pt stated that her cardiologist, Dr. Rockey Situ wanted to wait for 6 months before giving her cardiac clearance. This would be May. I asked her if she would want to make an appointment with Joycelyn Schmid for early May for surgical clearance with her. Pt could not hear me & husband got on the phone. He asked since he was coming for labs today if he could make his wife an appointment then & I said that was fine.

## 2019-10-21 NOTE — Telephone Encounter (Signed)
I agree that any elective surgery should be delayed for 6 months from the time of PCI in 05/2019.  Aspirin 81 mg daily should also be continued during the perioperative period if at all possible.  Nelva Bush, MD [facility]

## 2019-10-21 NOTE — Telephone Encounter (Signed)
Leave detailed message to [facility] about recommendation for delaying of pt procedure, also leave call back # if there will be any questions or concerns.

## 2019-10-21 NOTE — Telephone Encounter (Signed)
   Primary Cardiologist: Nelva Bush, MD  Chart reviewed as part of pre-operative protocol coverage. Patient was contacted 10/21/2019 in reference to pre-operative risk assessment for pending surgery as outlined below.  Victoria Burns was last seen on 10/15/19 by Dr. Saunders Revel.  She underwent PCI/DES x3 05/2019 (ostial LCx, proximal OM1, and distal OM1) and ideally would be on uninterrupted plavix and apixaban therapy for at least 6 months.   At this time, we recommend delaying her surgery until after 12/04/2019 in an effort to minimize post-PCI complications. Please re-submit a clearance request closer to the time of her surgery in late May or early June. Patient was updated to these recommendations and in agreement with the plan to delay surgery.  Per Dr. Saunders Revel, would be reasonable to hold plavix 5 days prior to surgery, though would transition to aspirin at that time to be continued in the perioperative setting.   I will route this recommendation to the requesting surgeons office via the Epic fax function and remove from the preop pool.   Pre-op covering staff: - Please contact requesting surgeon's office via preferred method (i.e, phone, fax) to inform them of need to delay surgery. Please ask that they submit a new request for preop closer to the time of her rescheduled surgery.   Victoria Butts, PA-C 10/21/2019, 9:07 AM

## 2019-10-28 ENCOUNTER — Ambulatory Visit (INDEPENDENT_AMBULATORY_CARE_PROVIDER_SITE_OTHER): Payer: Medicare Other

## 2019-10-28 VITALS — Ht 61.0 in | Wt 177.0 lb

## 2019-10-28 DIAGNOSIS — Z0000 Encounter for general adult medical examination without abnormal findings: Secondary | ICD-10-CM

## 2019-10-28 NOTE — Progress Notes (Addendum)
Subjective:   Victoria Burns is a 79 y.o. female who presents for an Initial Medicare Annual Wellness Visit.  Review of Systems    No ROS.  Medicare Wellness Virtual Visit.  Visual/audio telehealth visit, UTA vital signs.   Ht/Wt provided.  See social history for additional risk factors.    Cardiac Risk Factors include: advanced age (>78men, >50 women);hypertension     Objective:    Today's Vitals   10/28/19 0938  Weight: 177 lb (80.3 kg)  Height: 5\' 1"  (1.549 m)   Body mass index is 33.44 kg/m.  Advanced Directives 10/28/2019 08/01/2019 08/01/2019 06/24/2019 06/20/2019 06/20/2019 06/05/2019  Does Patient Have a Medical Advance Directive? Yes Yes No Yes Yes Yes Yes  Type of Advance Directive - Living will;Healthcare Power of [place];Living will [place];Living will [place];Living will [place];Living will  Does patient want to make changes to medical advance directive? No - Patient declined No - Patient declined - No - Patient declined No - Patient declined - No - Patient declined  Copy of [place] in Chart? - No - copy requested - No - copy requested - - No - copy requested  Would patient like information on creating a medical advance directive? - - No - Patient declined - - - No - Patient declined    Current Medications (verified) Outpatient Encounter Medications as of 10/28/2019  Medication Sig  . amiodarone (PACERONE) 200 MG tablet Take 1 tablet (200 mg) by mouth once daily  . amLODipine (NORVASC) 5 MG tablet Take 1 tablet (5 mg total) by mouth daily.  Marland Kitchen apixaban (ELIQUIS) 5 MG TABS tablet Take 1 tablet (5 mg total) by mouth 2 (two) times daily.  Marland Kitchen atorvastatin (LIPITOR) 80 MG tablet Take 1 tablet (80 mg total) by mouth daily at 6 PM.  . clopidogrel (PLAVIX) 75 MG tablet Take 1 tablet (75 mg total) by mouth daily.  . furosemide (LASIX) 20 MG tablet Take 1 tablet  (20 mg total) by mouth daily.  . isosorbide mononitrate (IMDUR) 30 MG 24 hr tablet Take 1 tablet (30 mg total) by mouth 2 (two) times daily.  Marland Kitchen levothyroxine (SYNTHROID) 25 MCG tablet TAKE 1 TABLET BY MOUTH IN  THE MORNING ON AN EMPTY  STOMACH WITH WATER - NO  FOOD OR MEDICATIONS FOR 1/2 HOUR  . losartan (COZAAR) 100 MG tablet Take 0.5 tablets (50 mg total) by mouth daily.  . Misc Natural Products (FOCUSED MIND PO) Take 1 tablet by mouth daily.   . Multiple Vitamins-Minerals (ZINC PO) Take 1 tablet by mouth daily.   . nitroGLYCERIN (NITROSTAT) 0.3 MG SL tablet DISSOLVE 1 TABLET UNDER THE TONGUE EVERY 5 MINUTES AS  NEEDED FOR CHEST PAIN. MAX  OF 3 TABLETS IN 15 MINUTES. CALL 911 IF PAIN PERSISTS.  [name] Glycol-Propyl Glycol (SYSTANE) 0.4-0.3 % SOLN Place 1 drop into both eyes 2 (two) times daily as needed (Dry eye).  . polyethylene glycol (MIRALAX / GLYCOLAX) 17 g packet Take 17 g by mouth daily as needed for moderate constipation.  . potassium chloride (KLOR-CON) 10 MEQ tablet Take 1 tablet (10 mEq total) by mouth daily.  Marland Kitchen VITAMIN D PO Take 1 tablet by mouth daily.    No facility-administered encounter medications on file as of 10/28/2019.    Allergies (verified) Iodinated diagnostic agents, Codeine, and Penicillins   History: Past Medical History:  Diagnosis Date  . Arthritis   .  CAD (coronary artery disease)    a. s/p remote OM stenting; b. 05/2017 Cath ([facility] [place]): LAD mild irregs, LCX 82m, OM2 40 ISR, RCA dominant, RPLV 50; c. 05/2019 Cath: LM nl, LAD mild-mod diff dzs, LCX 70, OM1 70-80p, 107m, RCA mod diff dzs; c. 05/2019 PCI/orbital atherectomy: OM1 60/80 (2.5x18 & 2.75x12 Resolute Onyx DES), LCX 75ost (3.5x12 Resolute Onyx DES).  . Carotid artery disease ([place])    a. s/p bilateral CEAs; b. 02/2018 Carotid U/S: 1-39% bilat ICA stenosis. F/u prn.  . Chronic back pain   . Hyperlipidemia   . Hypertension   . Hypothyroidism   . Juxtafoveal telangietasis of both eyes   .  PAD (peripheral artery disease) ([place])    a. 05/2019 Cath: found to have 80% ost RCIA stenosis and 100 RSFA.  . Paroxysmal atrial fibrillation ([place]) 07/2018  . RBBB   . S/P TAVR (transcatheter aortic valve replacement)   . Severe aortic stenosis    a. 06/2019 TAVR: 52mm MDT Evolut Pro Plus TAVR; b. 06/2019 Echo: EF 60-65%, mod LVH. Gr1 DD. No rwma. AoV mean/peak grad 5.5/11.79mmHg. No AS.  Marland Kitchen Sleep apnea    does not wear CPAP  . TIA (transient ischemic attack)    Past Surgical History:  Procedure Laterality Date  . AORTIC VALVE REPLACEMENT    . BACK SURGERY  08/2016  . BREAST SURGERY     Biopsy  . CARDIAC CATHETERIZATION    . CAROTID ENDARTERECTOMY Bilateral   . CHOLECYSTECTOMY    . CORONARY ANGIOGRAPHY N/A 06/05/2019   Procedure: CORONARY ANGIOGRAPHY (CATH LAB);  Surgeon: Burnell Blanks, MD;  Location: [place] [facility];  Service: Cardiovascular;  Laterality: N/A;  . CORONARY ANGIOPLASTY    . CORONARY ATHERECTOMY N/A 06/06/2019   Procedure: CORONARY ATHERECTOMY;  Surgeon: Martinique, Peter M, MD;  Location: [place] [facility];  Service: Cardiovascular;  Laterality: N/A;  . RIGHT/LEFT HEART CATH AND CORONARY ANGIOGRAPHY Bilateral 05/23/2019   Procedure: RIGHT/LEFT HEART CATH AND CORONARY ANGIOGRAPHY;  Surgeon: Minna Merritts, MD;  Location: [place] [facility];  Service: Cardiovascular;  Laterality: Bilateral;  . TEE WITHOUT CARDIOVERSION N/A 06/24/2019   Procedure: TRANSESOPHAGEAL ECHOCARDIOGRAM (TEE);  Surgeon: Burnell Blanks, MD;  Location: [facility];  Service: Open Heart Surgery;  Laterality: N/A;  . TONSILLECTOMY AND ADENOIDECTOMY     Family History  Problem Relation Age of Onset  . Heart disease Mother   . Hyperlipidemia Mother   . Hypertension Mother   . Alcohol abuse Father   . COPD Father   . Heart attack Father   . Arthritis Sister   . Hypertension Sister   . COPD Sister   . Heart attack Sister   . Heart disease Sister   . Hypertension Sister   .  Pancreatic cancer Sister    Social History   Socioeconomic History  . Marital status: Married    Spouse name: Not on file  . Number of children: 3  . Years of education: Not on file  . Highest education level: Not on file  Occupational History  . 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   Tobacco Use  . Smoking status: Former Smoker    Types: Cigarettes  . Smokeless tobacco: Never Used  Substance and Sexual Activity  . Alcohol use: No  . Drug use: No  . Sexual activity: Yes  Other Topics Concern  . Not on file  Social History Narrative  . Not on file   Social Determinants of  Health   Financial Resource Strain:   . Difficulty of Paying Living Expenses:   Food Insecurity:   . Worried About Charity fundraiser in the Last Year:   . Arboriculturist in the Last Year:   Transportation Needs:   . Film/video editor (Medical):   Marland Kitchen Lack of Transportation (Non-Medical):   Physical Activity:   . Days of Exercise per Week:   . Minutes of Exercise per Session:   Stress:   . Feeling of Stress :   Social Connections:   . Frequency of Communication with Friends and Family:   . Frequency of Social Gatherings with Friends and Family:   . Attends Religious Services:   . Active Member of Clubs or Organizations:   . Attends Archivist Meetings:   Marland Kitchen Marital Status:     Tobacco Counseling Counseling given: Not Answered   Clinical Intake:  Pre-visit preparation completed: No        Diabetes: No  How often do you need to have someone help you when you read instructions, pamphlets, or other written materials from your doctor or pharmacy?: 1 - Never  Interpreter Needed?: No      Activities of Daily Living In your present state of health, do you have any difficulty performing the following activities: 10/28/2019 08/01/2019  Hearing? Y N  Comment Hx of hearing aids; not currently worn -  Vision? N N  Difficulty concentrating or making decisions? N N  Walking  or climbing stairs? Y N  Comment Unsteady gait; walker in use while indoors -  Dressing or bathing? N N  Doing errands, shopping? N N  Preparing Food and eating ? N -  Using the Toilet? N -  In the past six months, have you accidently leaked urine? N -  Do you have problems with loss of bowel control? N -  Managing your Medications? N -  Comment Husband assists with pill box prep -  Managing your Finances? N -  Housekeeping or managing your Housekeeping? N -  Some recent data might be hidden     Immunizations and Health Maintenance Immunization History  Administered Date(s) Administered  . Fluad Quad(high Dose 65+) 03/19/2019  . PFIZER SARS-COV-2 Vaccination 07/23/2019, 08/13/2019  . Pneumococcal Conjugate-13 09/03/2019  . Pneumococcal Polysaccharide-23 07/18/2007   Health Maintenance Due  Topic Date Due  . TETANUS/TDAP  Never done  . DEXA SCAN  Never done    Patient Care Team: Burnard Hawthorne, FNP as PCP - General (Family Medicine) End, Harrell Gave, MD as PCP - Cardiology (Cardiology)  Indicate any recent Medical Services you may have received from other than [facility] providers in the past year (date may be approximate).     Assessment:   This is a routine wellness examination for Raheel.   Nurse connected with patient 10/28/19 at  9:30 AM EDT by a telephone enabled telemedicine application and verified that I am speaking with the correct person using two identifiers. Patient stated full name and DOB. Patient gave permission to continue with virtual visit. Patient's location was at home and Nurse's location was at [place] office.   Patient is alert and oriented x3. Patient denies difficulty focusing or concentrating. Patient likes to read and plays bingo for brain stimulation.   Health Maintenance Due: -Shingles and Tdap vaccine- discussed; to be completed with doctor in visit or local pharmacy with a minimum of 4 weeks after covid vaccine series.  -Dexa Scan-  ordered 08/18/19, to  be scheduled.  See completed HM at the end of note.   Dental: Visits every 12 months.    Hearing: Hearing aids- Hx of wearing hearing aids, not currently in use.   Safety:  Patient feels safe at home- yes Patient does have smoke detectors at home- yes Patient does wear sunscreen or protective clothing when in direct sunlight - yes Patient does wear seat belt when in a moving vehicle - yes Patient drives- yes Adequate lighting in walkways free from debris- yes Grab bars and handrails used as appropriate- yes Ambulates with an assistive device- yes; walker in use while indoors.  Cell phone on person when ambulating outside of the home- yes  Social: Alcohol intake - no       Smoking history- never   Smokers in home? none Illicit drug use? none  Medication: Taking as directed and without issues.  Husband manages with pill box in use  Cpap not in use  Covid-19: Precautions and sickness symptoms discussed. Wears mask, social distancing, hand hygiene as appropriate.   Activities of Daily Living Patient denies needing assistance with: household chores, feeding themselves, getting from bed to chair, getting to the toilet, bathing/showering, dressing, managing money, or preparing meals.   Discussed the importance of a healthy diet, water intake and the benefits of aerobic exercise.  Educational material provided.  Physical activity- limited. Encouraged to stay active.   Diet:  Heart healthy Water: Encouraged to stay hydrated.  Caffeine: none  Other Providers Patient Care Team: Burnard Hawthorne, FNP as PCP - General (Family Medicine) End, Harrell Gave, MD as PCP - Cardiology (Cardiology) Hearing/Vision screen  Hearing Screening   125Hz  250Hz  500Hz  1000Hz  2000Hz  3000Hz  4000Hz  6000Hz  8000Hz   Right ear:           Left ear:           Comments: Difficulty hearing some conversational tones.  Hx of hearing aids, not currently worn.   Vision Screening  Comments: Wears corrective lenses Visual acuity not assessed, virtual visit.  They have seen their ophthalmologist.     Dietary issues and exercise activities discussed: Current Exercise Habits: The patient does not participate in regular exercise at present, Exercise limited by: orthopedic condition(s)  Goals    . DIET - INCREASE WATER INTAKE     Stay hydrated.  Add more water to diet when taking supplemental fiber to assist with constipation.       Depression Screen PHQ 2/9 Scores 10/28/2019 09/08/2019 08/18/2019 08/18/2019  PHQ - 2 Score 0 3 0 0  PHQ- 9 Score - 6 3 -    Fall Risk Fall Risk  09/03/2019  Falls in the past year? 1  Comment tripped and fell on knee  Number falls in past yr: 0  Injury with Fall? 1  Comment torn cartilidge  Risk for fall due to : History of fall(s)  Follow up Falls prevention discussed   No falls since last reported.   Timed Get Up and Go Performed no, virtual visit.  Cognitive Function:     6CIT Screen 10/28/2019  What Year? 0 points  What month? 0 points  What time? 0 points  Count back from 20 0 points  Months in reverse 0 points  Repeat phrase 0 points  Total Score 0    Screening Tests Health Maintenance  Topic Date Due  . TETANUS/TDAP  Never done  . DEXA SCAN  Never done  . INFLUENZA VACCINE  02/15/2020  . PNA vac Low Risk  Adult  Completed     Plan:   Keep all routine maintenance appointments.   Follow up 11/24/19 @ 2:30 surgical clearance.   Medicare Attestation I have personally reviewed: The patient's medical and social history Their use of alcohol, tobacco or illicit drugs Their current medications and supplements The patient's functional ability including ADLs,fall risks, home safety risks, cognitive, and hearing and visual impairment Diet and physical activities Evidence for depression   I have reviewed and discussed with patient certain preventive protocols, quality metrics, and best practice recommendations.        Varney Biles, LPN   [id number]     Agree with plan. Mable Paris, NP

## 2019-10-28 NOTE — Patient Instructions (Addendum)
  Victoria Burns , Thank you for taking time to come for your Medicare Wellness Visit. I appreciate your ongoing commitment to your health goals. Please review the following plan we discussed and let me know if I can assist you in the future.   These are the goals we discussed: Goals    . DIET - INCREASE WATER INTAKE     Stay hydrated.  Add more water to diet when taking supplemental fiber to assist with constipation.        This is a list of the screening recommended for you and due dates:  Health Maintenance  Topic Date Due  . Tetanus Vaccine  Never done  . DEXA scan (bone density measurement)  Never done  . Flu Shot  02/15/2020  . Pneumonia vaccines  Completed

## 2019-10-31 ENCOUNTER — Other Ambulatory Visit: Payer: Self-pay | Admitting: Cardiovascular Disease

## 2019-10-31 NOTE — Telephone Encounter (Signed)
Please advise if ok to refill Furosemide 20 mg qd. Last filled Thornell Mule, MD

## 2019-11-17 ENCOUNTER — Encounter: Payer: Self-pay | Admitting: Family

## 2019-11-17 ENCOUNTER — Ambulatory Visit (INDEPENDENT_AMBULATORY_CARE_PROVIDER_SITE_OTHER): Payer: Medicare Other | Admitting: Family

## 2019-11-17 ENCOUNTER — Other Ambulatory Visit: Payer: Self-pay

## 2019-11-17 DIAGNOSIS — R0602 Shortness of breath: Secondary | ICD-10-CM | POA: Insufficient documentation

## 2019-11-17 NOTE — Patient Instructions (Addendum)
As discussed, any recurrence or worsening of shortness of breath or palpitations, please seek immediate attention at emergency room. Appointment tomorrow with Mickle Plumb , [place] at [facility] as discussed.

## 2019-11-17 NOTE — Progress Notes (Signed)
Subjective:    Patient ID: Victoria Burns, female    DOB: Sep 26, 1940, 79 y.o.   MRN: [id number]  CC: Victoria Burns is a 79 y.o. female who presents today for follow up.   HPI: Accompanied by husband today. Has noticed more SOB again over the past couple of weeks. Slight in comparison to prior to TAVR.  Describes SOB when working in house, which is 'off and on'. No SOB when still. Has been less fatigue.  Has palpitations, occurring 2-3 times per week.  No leg swelling , congestion, sinus pain, cough, orthopnea.  NO CP. Hasnt used nitro. Compliant with eliquis  Atrial fibrillation-Dr. End.  Appointment with EP end of May.   Considering left knee replacement May 2021.   Hypothyroidism- compliant with medication  Dr End 09/2019-maintain sinus rhythm on and tolerating amiodarone. Referred to EP.  Plan to remain on indefinite coagulation.  Continue apixaban and clopidogrel until 6 mos after 06/06/2019 HR 56. Echo left ventricular EF 60-65% Due DEXA, mammogram  HISTORY:  Past Medical History:  Diagnosis Date  . Arthritis   . CAD (coronary artery disease)    a. s/p remote OM stenting; b. 05/2017 Cath ([facility]): LAD mild irregs, LCX 29m, OM2 40 ISR, RCA dominant, RPLV 50; c. 05/2019 Cath: LM nl, LAD mild-mod diff dzs, LCX 70, OM1 70-80p, 42m, RCA mod diff dzs; c. 05/2019 PCI/orbital atherectomy: OM1 60/80 (2.5x18 & 2.75x12 Resolute Onyx DES), LCX 75ost (3.5x12 Resolute Onyx DES).  . Carotid artery disease ([place])    a. s/p bilateral CEAs; b. 02/2018 Carotid U/S: 1-39% bilat ICA stenosis. F/u prn.  . Chronic back pain   . Hyperlipidemia   . Hypertension   . Hypothyroidism   . Juxtafoveal telangietasis of both eyes   . PAD (peripheral artery disease) ([place])    a. 05/2019 Cath: found to have 80% ost RCIA stenosis and 100 RSFA.  . Paroxysmal atrial fibrillation ([place]) 07/2018  . RBBB   . S/P TAVR (transcatheter aortic valve replacement)   . Severe aortic stenosis    a.  06/2019 TAVR: 56mm MDT Evolut Pro Plus TAVR; b. 06/2019 Echo: EF 60-65%, mod LVH. Gr1 DD. No rwma. AoV mean/peak grad 5.5/11.67mmHg. No AS.  Marland Kitchen Sleep apnea    does not wear CPAP  . TIA (transient ischemic attack)    Past Surgical History:  Procedure Laterality Date  . AORTIC VALVE REPLACEMENT    . BACK SURGERY  08/2016  . BREAST SURGERY     Biopsy  . CARDIAC CATHETERIZATION    . CAROTID ENDARTERECTOMY Bilateral   . CHOLECYSTECTOMY    . CORONARY ANGIOGRAPHY N/A 06/05/2019   Procedure: CORONARY ANGIOGRAPHY (CATH LAB);  Surgeon: Burnell Blanks, MD;  Location: [place] [facility];  Service: Cardiovascular;  Laterality: N/A;  . CORONARY ANGIOPLASTY    . CORONARY ATHERECTOMY N/A 06/06/2019   Procedure: CORONARY ATHERECTOMY;  Surgeon: Martinique, Peter M, MD;  Location: [place] [facility];  Service: Cardiovascular;  Laterality: N/A;  . RIGHT/LEFT HEART CATH AND CORONARY ANGIOGRAPHY Bilateral 05/23/2019   Procedure: RIGHT/LEFT HEART CATH AND CORONARY ANGIOGRAPHY;  Surgeon: Minna Merritts, MD;  Location: [place] [facility];  Service: Cardiovascular;  Laterality: Bilateral;  . TEE WITHOUT CARDIOVERSION N/A 06/24/2019   Procedure: TRANSESOPHAGEAL ECHOCARDIOGRAM (TEE);  Surgeon: Burnell Blanks, MD;  Location: [place];  Service: Open Heart Surgery;  Laterality: N/A;  . TONSILLECTOMY AND ADENOIDECTOMY     Family History  Problem Relation Age of  Onset  . Heart disease Mother   . Hyperlipidemia Mother   . Hypertension Mother   . Alcohol abuse Father   . COPD Father   . Heart attack Father   . Arthritis Sister   . Hypertension Sister   . COPD Sister   . Heart attack Sister   . Heart disease Sister   . Hypertension Sister   . Pancreatic cancer Sister     Allergies: Iodinated diagnostic agents, Codeine, and Penicillins Current Outpatient Medications on File Prior to Visit  Medication Sig Dispense Refill  . amiodarone (PACERONE) 200 MG tablet Take 1 tablet (200 mg) by mouth once  daily 90 tablet 1  . amLODipine (NORVASC) 5 MG tablet Take 1 tablet (5 mg total) by mouth daily. 90 tablet 3  . apixaban (ELIQUIS) 5 MG TABS tablet Take 1 tablet (5 mg total) by mouth 2 (two) times daily. 180 tablet 1  . atorvastatin (LIPITOR) 80 MG tablet Take 1 tablet (80 mg total) by mouth daily at 6 PM. 90 tablet 2  . clopidogrel (PLAVIX) 75 MG tablet TAKE 1 TABLET BY MOUTH  DAILY 90 tablet 0  . furosemide (LASIX) 20 MG tablet TAKE 1 TABLET BY MOUTH  DAILY 90 tablet 0  . isosorbide mononitrate (IMDUR) 30 MG 24 hr tablet Take 1 tablet (30 mg total) by mouth 2 (two) times daily. 180 tablet 2  . levothyroxine (SYNTHROID) 25 MCG tablet TAKE 1 TABLET BY MOUTH IN  THE MORNING ON AN EMPTY  STOMACH WITH WATER - NO  FOOD OR MEDICATIONS FOR 1/2 HOUR 90 tablet 3  . losartan (COZAAR) 100 MG tablet Take 0.5 tablets (50 mg total) by mouth daily. 90 tablet 2  . Misc Natural Products (FOCUSED MIND PO) Take 1 tablet by mouth daily.     . Multiple Vitamins-Minerals (ZINC PO) Take 1 tablet by mouth daily.     . nitroGLYCERIN (NITROSTAT) 0.3 MG SL tablet DISSOLVE 1 TABLET UNDER THE TONGUE EVERY 5 MINUTES AS  NEEDED FOR CHEST PAIN. MAX  OF 3 TABLETS IN 15 MINUTES. CALL 911 IF PAIN PERSISTS. 25 tablet 3  . Polyethyl Glycol-Propyl Glycol (SYSTANE) 0.4-0.3 % SOLN Place 1 drop into both eyes 2 (two) times daily as needed (Dry eye).    . polyethylene glycol (MIRALAX / GLYCOLAX) 17 g packet Take 17 g by mouth daily as needed for moderate constipation. 14 each 0  . VITAMIN D PO Take 1 tablet by mouth daily.     . potassium chloride (KLOR-CON) 10 MEQ tablet Take 1 tablet (10 mEq total) by mouth daily. 60 tablet 0   No current facility-administered medications on file prior to visit.    Social History   Tobacco Use  . Smoking status: Former Smoker    Types: Cigarettes  . Smokeless tobacco: Never Used  Substance Use Topics  . Alcohol use: No  . Drug use: No    Review of Systems  Constitutional: Negative for  chills and fever.  HENT: Negative for congestion and sinus pressure.   Respiratory: Positive for shortness of breath. Negative for chest tightness and wheezing.   Cardiovascular: Negative for chest pain, palpitations and leg swelling.  Gastrointestinal: Negative for nausea and vomiting.      Objective:    BP 138/66   Pulse 61   Temp (!) 97.4 F (36.3 C) (Temporal)   Ht 4' 10.75" (1.492 m)   Wt 179 lb 6.4 oz (81.4 kg)   SpO2 96%   BMI 36.54 kg/m  BP Readings from Last 3 Encounters:  11/18/19 (!) 144/66  11/17/19 138/66  10/15/19 134/72   Wt Readings from Last 3 Encounters:  11/18/19 179 lb 8 oz (81.4 kg)  11/17/19 179 lb 6.4 oz (81.4 kg)  10/28/19 177 lb (80.3 kg)    Physical Exam Vitals reviewed.  Constitutional:      Appearance: She is well-developed.  Eyes:     Conjunctiva/sclera: Conjunctivae normal.  Cardiovascular:     Rate and Rhythm: Normal rate and regular rhythm.     Pulses: Normal pulses.     Heart sounds: Normal heart sounds.  Pulmonary:     Effort: Pulmonary effort is normal.     Breath sounds: Normal breath sounds. No wheezing, rhonchi or rales.  Skin:    General: Skin is warm and dry.  Neurological:     Mental Status: She is alert.  Psychiatric:        Speech: Speech normal.        Behavior: Behavior normal.        Thought Content: Thought content normal.        Assessment & Plan:   Problem List Items Addressed This Visit      Other   SOB (shortness of breath) on exertion    No shortness of breath today.  No cough, fever, lower extremity edema.   I walked the patient up and down the hallway, she was not labored in her speech, nor any respiratory distress.  Her SaO2 stayed at 95 to 96%, heart rate between 59 and 65.  EKG shows sinus bradycardia, known right bundle branch block.  When compared to prior EKG in March of this year, no significant changes.  No acute ischemia.          I am having Victoria Burns maintain her Misc Natural  Products (FOCUSED MIND PO), Multiple Vitamins-Minerals (ZINC PO), VITAMIN D PO, atorvastatin, isosorbide mononitrate, Systane, nitroGLYCERIN, amLODipine, losartan, polyethylene glycol, apixaban, potassium chloride, levothyroxine, amiodarone, furosemide, and clopidogrel.   No orders of the defined types were placed in this encounter.   Return precautions given.   Risks, benefits, and alternatives of the medications and treatment plan prescribed today were discussed, and patient expressed understanding.   Education regarding symptom management and diagnosis given to patient on AVS.  Continue to follow with Burnard Hawthorne, FNP for routine health maintenance.   Victoria Burns and I agreed with plan.   Mable Paris, FNP

## 2019-11-17 NOTE — Assessment & Plan Note (Addendum)
No shortness of breath today.  No cough, fever, lower extremity edema.   I walked the patient up and down the hallway, she was not labored in her speech, nor any respiratory distress.  Her SaO2 stayed at 95 to 96%, heart rate between 59 and 65.  EKG shows sinus bradycardia, known right bundle branch block.  When compared to prior EKG in March of this year, no significant changes.  No acute ischemia.

## 2019-11-18 ENCOUNTER — Ambulatory Visit (INDEPENDENT_AMBULATORY_CARE_PROVIDER_SITE_OTHER): Payer: Medicare Other | Admitting: Physician Assistant

## 2019-11-18 ENCOUNTER — Encounter: Payer: Self-pay | Admitting: Physician Assistant

## 2019-11-18 VITALS — BP 144/66 | HR 58 | Ht 61.0 in | Wt 179.5 lb

## 2019-11-18 DIAGNOSIS — R002 Palpitations: Secondary | ICD-10-CM

## 2019-11-18 DIAGNOSIS — I10 Essential (primary) hypertension: Secondary | ICD-10-CM

## 2019-11-18 DIAGNOSIS — Z952 Presence of prosthetic heart valve: Secondary | ICD-10-CM | POA: Diagnosis not present

## 2019-11-18 DIAGNOSIS — Z8673 Personal history of transient ischemic attack (TIA), and cerebral infarction without residual deficits: Secondary | ICD-10-CM

## 2019-11-18 DIAGNOSIS — I480 Paroxysmal atrial fibrillation: Secondary | ICD-10-CM | POA: Diagnosis not present

## 2019-11-18 DIAGNOSIS — I6523 Occlusion and stenosis of bilateral carotid arteries: Secondary | ICD-10-CM

## 2019-11-18 DIAGNOSIS — I25118 Atherosclerotic heart disease of native coronary artery with other forms of angina pectoris: Secondary | ICD-10-CM

## 2019-11-18 DIAGNOSIS — I4510 Unspecified right bundle-branch block: Secondary | ICD-10-CM

## 2019-11-18 DIAGNOSIS — E785 Hyperlipidemia, unspecified: Secondary | ICD-10-CM

## 2019-11-18 DIAGNOSIS — I340 Nonrheumatic mitral (valve) insufficiency: Secondary | ICD-10-CM

## 2019-11-18 DIAGNOSIS — E039 Hypothyroidism, unspecified: Secondary | ICD-10-CM

## 2019-11-18 DIAGNOSIS — I442 Atrioventricular block, complete: Secondary | ICD-10-CM

## 2019-11-18 DIAGNOSIS — R0602 Shortness of breath: Secondary | ICD-10-CM

## 2019-11-18 DIAGNOSIS — I739 Peripheral vascular disease, unspecified: Secondary | ICD-10-CM

## 2019-11-18 MED ORDER — EZETIMIBE 10 MG PO TABS
10.0000 mg | ORAL_TABLET | Freq: Every day | ORAL | 3 refills | Status: DC
Start: 2019-11-18 — End: 2019-11-27

## 2019-11-18 NOTE — Progress Notes (Signed)
Office Visit    Patient Name: Victoria Burns Date of Encounter: 11/19/2019  Primary Care Provider:  Burnard Hawthorne, FNP Primary Cardiologist:  Nelva Bush, MD  Chief Complaint    Chief Complaint  Patient presents with  . office visit    Pt having SOB and Palps. Meds verbally reviewed w/ pt.    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  Past Medical History    Past Medical History:  Diagnosis Date  . Arthritis   . CAD (coronary artery disease)    a. s/p remote OM stenting; b. 05/2017 Cath ([facility]): LAD mild irregs, LCX 47m, OM2 40 ISR, RCA dominant, RPLV 50; c. 05/2019 Cath: LM nl, LAD mild-mod diff dzs, LCX 70, OM1 70-80p, 53m, RCA mod diff dzs; c. 05/2019 PCI/orbital atherectomy: OM1 60/80 (2.5x18 & 2.75x12 Resolute Onyx DES), LCX 75ost (3.5x12 Resolute Onyx DES).  . Carotid artery disease ([place])    a. s/p bilateral CEAs; b. 02/2018 Carotid U/S: 1-39% bilat ICA stenosis. F/u prn.  . Chronic back pain   . Hyperlipidemia   . Hypertension   . Hypothyroidism   . Juxtafoveal telangietasis of both eyes   . PAD (peripheral artery disease) ([place])    a. 05/2019 Cath: found to have 80% ost RCIA stenosis and 100 RSFA.  . Paroxysmal atrial fibrillation ([place]) 07/2018  . RBBB   .  S/P TAVR (transcatheter aortic valve replacement)   . Severe aortic stenosis    a. 06/2019 TAVR: 11mm MDT Evolut Pro Plus TAVR; b. 06/2019 Echo: EF 60-65%, mod LVH. Gr1 DD. No rwma. AoV mean/peak grad 5.5/11.41mmHg. No AS.  Marland Kitchen Sleep apnea    does not wear CPAP  . TIA (transient ischemic attack)    Past Surgical History:  Procedure Laterality Date  . AORTIC VALVE REPLACEMENT    . BACK SURGERY  08/2016  . BREAST SURGERY     Biopsy  . CARDIAC CATHETERIZATION    . CAROTID ENDARTERECTOMY Bilateral   . CHOLECYSTECTOMY    . CORONARY ANGIOGRAPHY N/A 06/05/2019   Procedure: CORONARY ANGIOGRAPHY (CATH LAB);  Surgeon: Burnell Blanks, MD;  Location: [facility];  Service: Cardiovascular;  Laterality: N/A;  . CORONARY ANGIOPLASTY    . CORONARY ATHERECTOMY N/A 06/06/2019   Procedure: CORONARY ATHERECTOMY;  Surgeon: Martinique, Peter M, MD;  Location: [facility];  Service: Cardiovascular;  Laterality: N/A;  . RIGHT/LEFT HEART CATH AND CORONARY ANGIOGRAPHY Bilateral 05/23/2019   Procedure: RIGHT/LEFT HEART CATH AND CORONARY ANGIOGRAPHY;  Surgeon: Minna Merritts, MD;  Location: [facility];  Service: Cardiovascular;  Laterality: Bilateral;  . TEE WITHOUT CARDIOVERSION N/A 06/24/2019   Procedure: TRANSESOPHAGEAL ECHOCARDIOGRAM (TEE);  Surgeon: Burnell Blanks, MD;  Location: [facility];  Service: Open Heart Surgery;  Laterality: N/A;  . TONSILLECTOMY AND ADENOIDECTOMY      Allergies  Allergies  Allergen Reactions  . Iodinated Diagnostic Agents     Hives all over body.   . Codeine Rash  . Penicillins Rash    Did it involve swelling of the face/tongue/throat, SOB, or low BP? No Did it involve sudden or severe rash/hives, skin peeling, or any reaction on the inside of your mouth or nose? No Did you need to seek medical attention at a hospital or doctor's office? No When did it last happen?over 50 years ago If all above answers are "NO", may proceed with  cephalosporin use.    History of Present Illness    Victoria Burns is a 79 y.o. female with complex PMH as above.  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  She was seen by her primary cardiologist 07/2019 and following hospitalization earlier that month with atrial fibrillation with rapid ventricular response.  She converted to NSR with amiodarone.  She underwent PCI/DES x3 05/2019 (ostial left circumflex, proximal OM1, and distal OM1) with recommendation for uninterrupted Plavix and apixaban for at least 6 months.  At her most recent clinic visit, she was tolerating apixaban and amiodarone well.  She reported a recent fall/tripping since her previous visit, resulting in an injury to her left knee.    At her most recent visit, it was noted that, ideally, amiodarone would be weaned in the future given potential side effects/toxicities with indefinite amiodarone use versus recurrent atrial fibrillation that was very symptomatic.  Referral to EP was provided with appointment still pending and scheduled for the end of this month.  She was continued on indefinite anticoagulation.  AV nodal blocking agents were deferred given sinus bradycardia and underlying RBBB and transient AV block following TAVR.  She denied any anginal symptoms and was continued on amlodipine and isosorbide mononitrate.  As above, apixaban and clopidogrel were recommended to be continued for at least 6 months s/p PCI 05/2019, at which time she could potentially be switched from clopidogrel to ASA 81 mg daily.  She was continued on atorvastatin 80  mg daily for secondary prevention.  She was also continued on SBE prophylaxis for dental procedures s/p TAVR.  She continued to follow with the TAVR team.    After this visit, she called the office regarding obtaining needed cardiac documentation prior to orthopedic surgery / knee surgery and antiplatelet/OAC recommendations.  As indicated in phone notes, recommendation was to defer her procedure until s/p 6 months from her most recent PCI to minimize post PCI complications.  It was recommended she resubmit clearance closer to the time of her surgery in late May early June. On review of EMR, knee surgery was planned to be performed via [facility] and Dr. Johnny Bridge. It was recommended that she hold Plavix before her surgery and transition to ASA in the perioperative period.   Yesterday, 11/17/2019, she presented to her PCP with report of shortness of breath and palpitations.  She reported worsening shortness of breath over the last couple of weeks and similar to what she experienced before her TAVR.  She felt shortness of breath/dyspnea when working around the house.  She also noted palpitations, occurring 2-3 times per week.  She denied any current shortness of breath at the time of the appointment.  She was reportedly walked up and down the hallway without significant symptoms and with oxygen saturations 95 to 96% and heart rate between 59 and 65 bpm.  EKG was obtained but not available yet on review of EMR.  Per progress notes, EKG showed sinus bradycardia, known right bundle branch block, and no significant changes when compared with EKG from March 2021.  Appointment was made with [facility] and she was advised to present to the emergency room with any worsening shortness of breath or palpitations.  Today, she presents to clinic and is joined by her husband.  She clarifies that her main concern is regarding dyspnea with activity, as her palpitations have been going on for  some time and are not new for her.  She reports that she had a monitor performed (see CV studies below) that showed brief episodes of SVT; therefore, she feels that this is likely their etiology. She reports that the dyspnea, on the other hand, is new and has been ongoing for the last couple of weeks. This DOE is associated only with activity and improves with a period of rest. She wonders if it may be due to an element of deconditioning, as she is just now starting to become active again and try to get things done around the house after several admissions as outlined above as well as quarantine with COVID-19. She denies any chest pain, prolonged racing heart rate, or shortness of breath at rest. No orthopnea, PND, weight gain, or early satiety.  No LEE beyond that associated with her L leg, due to her knee injury. She also notes that her DOE may also be due to her knee injury, as this knee continues to bother her. She reports today that she and her husband are planning to move to [place], specifically [facility] near [place], which may be also adding some stress.  No reported signs or symptoms consistent with bleeding. She reports medication compliance.  At the time of our appointment, she is not experiencing any sx.  Home Medications    Prior to Admission medications   Medication Sig Start Date End Date Taking? Authorizing Provider  amiodarone (PACERONE) 200 MG tablet Take 1 tablet (200 mg) by mouth once daily 08/27/19   End, Harrell Gave, MD  amLODipine (NORVASC) 5 MG tablet Take 1 tablet (5 mg total) by mouth daily. 07/14/19 07/13/20  Eileen Stanford, PA-C  apixaban (ELIQUIS) 5 MG TABS tablet Take 1 tablet (5 mg total) by mouth 2 (two) times daily. 08/14/19   End, Harrell Gave, MD  atorvastatin (LIPITOR) 80 MG tablet Take 1 tablet (80 mg total) by mouth daily at 6 PM. 05/19/19   Gollan, Kathlene November, MD  clopidogrel (PLAVIX) 75 MG tablet TAKE 1 TABLET BY MOUTH  DAILY 11/03/19   Minna Merritts, MD  furosemide (LASIX) 20 MG tablet TAKE 1 TABLET BY MOUTH  DAILY 10/31/19   Minna Merritts, MD  isosorbide mononitrate (IMDUR) 30 MG 24 hr tablet Take 1 tablet (30 mg total) by mouth 2 (two) times daily. 05/19/19   Minna Merritts, MD  levothyroxine (SYNTHROID) 25 MCG tablet TAKE 1 TABLET BY MOUTH IN  THE MORNING ON AN EMPTY  STOMACH WITH WATER - NO  FOOD OR MEDICATIONS FOR 1/2 HOUR 08/27/19   Burnard Hawthorne, FNP  losartan (COZAAR) 100 MG tablet Take  0.5 tablets (50 mg total) by mouth daily. 08/02/19   Thornell Mule, MD  Misc Natural Products (FOCUSED MIND PO) Take 1 tablet by mouth daily.     [provider]  Multiple Vitamins-Minerals (ZINC PO) Take 1 tablet by mouth daily.     [provider]  nitroGLYCERIN (NITROSTAT) 0.3 MG SL tablet DISSOLVE 1 TABLET UNDER THE TONGUE EVERY 5 MINUTES AS  NEEDED FOR CHEST PAIN. MAX  OF 3 TABLETS IN 15 MINUTES. CALL 911 IF PAIN PERSISTS. 07/07/19   Minna Merritts, MD  Polyethyl Glycol-Propyl Glycol (SYSTANE) 0.4-0.3 % SOLN Place 1 drop into both eyes 2 (two) times daily as needed (Dry eye).    [provider]  polyethylene glycol (MIRALAX / GLYCOLAX) 17 g packet Take 17 g by mouth daily as needed for moderate constipation. 08/02/19   Thornell Mule, MD  potassium chloride (KLOR-CON) 10 MEQ tablet Take 1 tablet (10 mEq total) by mouth daily. 08/18/19 11/16/19  Minna Merritts, MD  VITAMIN D PO Take 1 tablet by mouth daily.     [provider]    Review of Systems    She denies chest pain, pnd, orthopnea, n, v, dizziness, syncope, weight gain, or early satiety. She reports palpitations and dyspnea. She reports chronic and unchanged LLE edema due to her knee injury.   All other systems reviewed and are otherwise negative except as noted above.  Physical Exam    VS:  BP (!) 144/66 (BP Location: Left Arm, Patient Position: Sitting, Cuff Size: Normal)   Pulse (!) 58   Ht 5\' 1"  (1.549 m)   Wt 179 lb 8 oz (81.4  kg)   SpO2 98%   BMI 33.92 kg/m  , BMI Body mass index is 33.92 kg/m. GEN: Well nourished, well developed, in no acute distress. Mask in place. Joined by her husband. HEENT: normal. Neck: Supple, no JVD, carotid bruits, or masses. Cardiac: RRR, 2/6 systolic murmur. No rubs, or gallops. No clubbing, cyanosis. LLE edema. No RLE edema.  Radials/DP/PT 2+ and equal bilaterally.  Respiratory:  Respirations regular and unlabored, clear to auscultation bilaterally. GI: Soft, nontender, nondistended, BS + x 4. MS: no deformity or atrophy. Skin: warm and dry, no rash. Neuro:  Strength and sensation are intact. Psych: Normal affect.  Accessory Clinical Findings    ECG personally reviewed by me today - SB, 54bpm, RBBB, QRS 116ms, TWI in inferior leads seen in previous EKG 08/08/19  - no acute changes.  VITALS Reviewed today   Temp Readings from Last 3 Encounters:  11/17/19 (!) 97.4 F (36.3 C) (Temporal)  09/15/19 98.1 F (36.7 C) (Temporal)  08/28/19 97.7 F (36.5 C) (Temporal)   BP Readings from Last 3 Encounters:  11/18/19 (!) 144/66  11/17/19 138/66  10/15/19 134/72   Pulse Readings from Last 3 Encounters:  11/18/19 (!) 58  11/17/19 61  10/15/19 (!) 56    Wt Readings from Last 3 Encounters:  11/18/19 179 lb 8 oz (81.4 kg)  11/17/19 179 lb 6.4 oz (81.4 kg)  10/28/19 177 lb (80.3 kg)     LABS  reviewed today   CareEverwhere Labs present and most recent? Yes/No: Yes  Lab Results  Component Value Date   WBC 7.1 11/18/2019   HGB 14.0 11/18/2019   HCT 41.1 11/18/2019   MCV 97 11/18/2019   PLT 221 11/18/2019   Lab Results  Component Value Date   CREATININE 0.78 11/18/2019   BUN 13 11/18/2019   NA 142  11/18/2019   K 4.2 11/18/2019   CL 104 11/18/2019   CO2 23 11/18/2019   Lab Results  Component Value Date   ALT 21 08/02/2019   AST 25 08/02/2019   ALKPHOS 55 08/02/2019   BILITOT 0.6 08/02/2019   Lab Results  Component Value Date   CHOL 146 09/03/2019   HDL  50 09/03/2019   LDLCALC 74 09/03/2019   TRIG 135 09/03/2019   CHOLHDL 2.9 09/03/2019    Lab Results  Component Value Date   HGBA1C 6.1 (H) 06/20/2019   Lab Results  Component Value Date   TSH 1.563 08/01/2019     STUDIES/PROCEDURES reviewed today   Echocardiogram 08/01/2019 Left ventricular ejection fraction, by visual estimation, is 60 to  65%. The left ventricle has normal function. There is mildly increased  left ventricular hypertrophy.  2. Left ventricular diastolic parameters are consistent with Grade I  diastolic dysfunction (impaired relaxation).  3. The left ventricle has no regional wall motion abnormalities.  4. Global right ventricle has normal systolic function.The right  ventricular size is normal. No increase in right ventricular wall  thickness.  5. Left atrial size was normal.  6. The aortic valve was not well visualized. medtronic TAVR in place.  Aortic valve regurgitation is not visualized.  7. Aortic valve mean gradient measures 13.0 mmHg.  8. Mildly elevated pulmonary artery systolic pressure.   Telemetry monitoring 06/30/2019 Sinus rhythm One 4 beat run of non-sustained ventricular tachycardia 23 short runs of supraventricular tachycardia (longest 13 beats) No evidence of high grade AV block.   Carotid US 06/09/2019 Summary:  Right Carotid: Velocities in the right ICA are consistent with a 1-39%  stenosis.  Left Carotid: Velocities in the left ICA are consistent with a 1-39%  stenosis.  Vertebrals: Left vertebral artery demonstrates antegrade flow. Right  vertebral artery demonstrates retrograde flow.   Cath: Coronary atherectomy 06/05/19  1st Mrg-2 lesion is 60% stenosed.  A drug-eluting stent was successfully placed using a STENT RESOLUTE ONYX 2.5X18.  Post intervention, there is a 0% residual stenosis.  1st Mrg-1 lesion is 80% stenosed.  A drug-eluting stent was successfully placed using a [place] [id number].  Post  intervention, there is a 0% residual stenosis.  Ost Cx to Prox Cx lesion is 75% stenosed.  A drug-eluting stent was successfully placed using a STENT RESOLUTE ONYX 3.5X12.  Post intervention, there is a 0% residual stenosis. 1. Successful orbital atherectomy and stenting of the ostial LCx, proximal OM1 and distal OM1 with DES x3 Plan: DAPT for one year. Anticipate DC in am.   06/05/2019  LHC   1st Mrg-1 lesion is 80% stenosed.  1st Mrg-2 lesion is 60% stenosed. 1. Severe calcified stenosis in the proximal segment of the first obtuse marginal branch. Moderately severe restenosis in the stented segment of the mid body of the first OM branch 2. Aborted angioplasty of the lesions in the OM branch. Unable to cross the more proximal, calcified lesion with a balloon.   R/LHC 05/23/2019  2nd Mrg-1 lesion is 70% stenosed.  2nd Mrg-2 lesion is 30% stenosed.  2nd Mrg-3 lesion is 60% stenosed.  Hemodynamic findings consistent with moderate pulmonary hypertension. Right heart pressures RA mean of 3 RV 55/10/18 PA 52/17 mean 31 Wedge 25 Cardiac output 4.11 Cardiac index 2.23 Final Conclusions:   Severe disease of OM vessel, appears to be at proximal edge of stent Mild to moderate diffuse disease of RCA, LAD Severe aortic valve stenosis Severe PAD with  80% ostial right common iliac artery Occluded right SFA  Assessment & Plan    Dyspnea --Reports dyspnea x2 weeks with more strenuous activity around the house. Dyspnea improves with rest and reportedly does not feel similar to those sx experienced in Afib or before her 05/2019 PCI / atherectomy. Sx are not associated with CP during exertion and rest. No CP, dizziness, falls, or LOC.  --Sx do seem consistent with deconditioning after attempting to increase activity following sedentary lifestyle with several admissions and quarantine. Low suspicion for ACS given no CP as beofre and EKG without acute changes. She reports compliance with  clopidogrel as well. Low suspicion for PE given she is compliant with Eliquis and no s/sx of DVT with SpO2 98% and rates not tachycardic. No s/sx to suggest a bleed. She is euvolemic on exam. 07/2019 echo shows nl EF and TAVR in place. MR and pulmonary HTN, however, could contribute to her sx and with most recent heart pressures as above. Also considered was amiodarone lung with plan to discuss ongoing treatment with EP as scheduled.  --Obtain BMET and CBC. Given the suspicion of sx 2/2 deconditioning, she increase activity as tolerated, and as her knee allows. She will contact the office if her sx do not improve or progress, at which time further testing recommended and including MPI/cath, echo, TSH, CXR, PFTs.   Palpitations --As above, palpitations are chronic and unchanged for some time. Previous monitoring showed pSVT and NSVT. She also has newly diagnosed paroxysmal Afib and a history of transient CHB. When in Afib, she experiences CP that she reports is different from her usual palpitations and current DOE. No medication changes pending upcoming appointment with EP.  Paroxysmal atrial fibrillation with RVR --Maintaining sinus rhythm on amiodarone 200 mg once daily. Not on AV nodal blocking agents due to sinus bradycardia with underlying RBBB and transient complete AVB s/p TAVR.  No presyncope or LOC. No s/sx of bleeding. Compliant with apixaban 5 mg twice daily. No medication changes pending her upcoming EP appointment. She will continue amiodarone for now, given she is very symptomatic with CP in atrial fibrillation. Recommend continue monitoring per guideline with LFTs/TSH and periodic CXR/PFTs and eye exams as indicated.  Coronary artery disease with stable angina, s/p multiple PCIs --No recurrent chest pain following restoration of sinus rhythm. Recent DOE and will monitor though described different from that before her PCI and suspected to be 2/2 deconditioning. She is S/P atherectomy and PCI  06/06/2019 as outlined above with recommendation for at least 6 months of apixaban and clopidogrel (or until ~12/04/2019), at which time her primary cardiologist may choose to transition her from clopidogrel to ASA. Plan for knee surgery deferred for now given her upcoming move to [facility]. She is continued on atorvastatin 80 mg daily with addition of Zetia 10 mg daily today. Continue antianginal amlodipine and isosorbide mononitrate. We will plan to reach out to her primary cardiologist and inform him of her plan for move to [facility]. in next 6 weeks in the event that he has any recommendations for cardiologists in the area.   Aortic Stenosis S/p TAVR --Given her recent DOE, we will loop in the TAVR team. Most recent echo above with TAVR in place. Continue OAC/antiplatelet therapy, as well as SBE prophylaxis for any dental procedures. We will recheck a CBC. Given her upcoming move to [facility], we will also reach out to the TAVR team to get together a plan for follow-up.  Hypertension --BP elevated, though it has  been well controlled at previous visits. Given the recent stress reported, we agreed to defer any medication changes with this visit. Monitor salt and fluid intake. Recommended she start to monitor her BP at home and call the office if consistently over 130/80. Consider increased dose amlodipine 10mg  at follow-up if BP continues to be elevated at that time. Continue Losartan and lasix. We will recheck a BMET as above.  HLD --Most recent LDL 74 slightly above goal <70. Will start Zetia 10mg  daily. She will need repeat lipids in 6-8 weeks or around the time of her move to [place].  Hypothyroidism --Considered as possibly contributing to her sx of palpitations and DOE; though, TSH on 1/15 was 1.563. Continue current therapy per PCP. Continue to monitor TSH on amiodarone as above.   Medication changes: Add Zetia 10mg . Labs ordered: BMET, CBC. Studies / Imaging ordered: None. Future considerations: If ongoing or  progressive DOE, consider workup with MPI/cath, echo, TSH, CXR, and/ or PFTs. If BP still elevated at RTC, consider increasing to amlodipine 10mg , or given her palpitations, transition from amlodipine to diltiazem. Continue to monitor lungs/thyroid/eyes/liver on amiodarone if continued after upcoming EP visit. In 6-8 weeks, recheck lipids with addition of Zetia 10mg . Decision regarding transition from clopidogrel to ASA TBD per primary cardiologist at next visit (which will be scheduled depending on time of move to [facility]). Disposition: TBD based on plan for move, which may occur within the next few weeks.  Arvil Chaco, PA-C

## 2019-11-18 NOTE — Patient Instructions (Signed)
Medication Instructions:  1- START Zetia Take 1 tablet (10 mg total) by mouth daily *If you need a refill on your cardiac medications before your next appointment, please call your pharmacy*   Lab Work: Your physician recommends that you have lab work today(BMET, CBC)   If you have labs (blood work) drawn today and your tests are completely normal, you will receive your results only by: Marland Kitchen MyChart Message (if you have MyChart) OR . A paper copy in the mail If you have any lab test that is abnormal or we need to change your treatment, we will call you to review the results.   Testing/Procedures: None ordered    Follow-Up: At [facility], you and your health needs are our priority.  As part of our continuing mission to provide you with exceptional heart care, we have created designated Provider Care Teams.  These Care Teams include your primary Cardiologist (physician) and Advanced Practice Providers (APPs -  Physician Assistants and Nurse Practitioners) who all work together to provide you with the care you need, when you need it.  We recommend signing up for the patient portal called "MyChart".  Sign up information is provided on this After Visit Summary.  MyChart is used to connect with patients for Virtual Visits (Telemedicine).  Patients are able to view lab/test results, encounter notes, upcoming appointments, etc.  Non-urgent messages can be sent to your provider as well.   To learn more about what you can do with MyChart, go to [email or web address].    Your next appointment:   TBD based on ref to cardiology [place] with move

## 2019-11-19 LAB — CBC
Hematocrit: 41.1 % (ref 34.0–46.6)
Hemoglobin: 14 g/dL (ref 11.1–15.9)
MCH: 32.9 pg (ref 26.6–33.0)
MCHC: 34.1 g/dL (ref 31.5–35.7)
MCV: 97 fL (ref 79–97)
Platelets: 221 10*3/uL (ref 150–450)
RBC: 4.25 x10E6/uL (ref 3.77–5.28)
RDW: 13.9 % (ref 11.7–15.4)
WBC: 7.1 10*3/uL (ref 3.4–10.8)

## 2019-11-19 LAB — BASIC METABOLIC PANEL
BUN/Creatinine Ratio: 17 (ref 12–28)
BUN: 13 mg/dL (ref 8–27)
CO2: 23 mmol/L (ref 20–29)
Calcium: 9.3 mg/dL (ref 8.7–10.3)
Chloride: 104 mmol/L (ref 96–106)
Creatinine, Ser: 0.78 mg/dL (ref 0.57–1.00)
GFR calc Af Amer: 84 mL/min/{1.73_m2} (ref >59)
GFR calc non Af Amer: 73 mL/min/{1.73_m2} (ref >59)
Glucose: 137 mg/dL — ABNORMAL HIGH (ref 65–99)
Potassium: 4.2 mmol/L (ref 3.5–5.2)
Sodium: 142 mmol/L (ref 134–144)

## 2019-11-24 ENCOUNTER — Ambulatory Visit: Payer: Medicare Other | Admitting: Family

## 2019-11-27 ENCOUNTER — Other Ambulatory Visit: Payer: Self-pay | Admitting: Cardiovascular Disease

## 2019-11-27 MED ORDER — EZETIMIBE 10 MG PO TABS: 10.0000 mg | ORAL_TABLET | Freq: Every day | ORAL | 3 refills | Status: AC

## 2019-11-27 NOTE — Telephone Encounter (Signed)
Requested Prescriptions   Signed Prescriptions Disp Refills  . ezetimibe (ZETIA) 10 MG tablet 90 tablet 3    Sig: Take 1 tablet (10 mg total) by mouth daily.    Authorizing Provider: Marrianne Mood D    Ordering User: Britt Bottom

## 2019-11-27 NOTE — Telephone Encounter (Signed)
*  STAT* If patient is at the pharmacy, call can be transferred to refill team.   1. Which medications need to be refilled? (please list name of each medication and dose if known)   zetia 10 mg po q d    2. Which pharmacy/location (including street and city if local pharmacy) is medication to be sent to?  [facility]   3. Do they need a 30 day or 90 day supply? [place]

## 2019-12-09 ENCOUNTER — Other Ambulatory Visit: Payer: Self-pay | Admitting: Internal Medicine

## 2019-12-09 MED ORDER — APIXABAN 5 MG PO TABS: 5.0000 mg | ORAL_TABLET | Freq: Two times a day (BID) | ORAL | 1 refills | Status: DC

## 2019-12-09 NOTE — Telephone Encounter (Signed)
Refill request

## 2019-12-09 NOTE — Telephone Encounter (Signed)
*  STAT* If patient is at the pharmacy, call can be transferred to refill team.   1. Which medications need to be refilled? (please list name of each medication and dose if known) Eliquis 5 mg bid  2. Which pharmacy/location (including street and city if local pharmacy) is medication to be sent to? [facility]  3. Do they need a 30 day or 90 day supply? [place]

## 2019-12-09 NOTE — Telephone Encounter (Signed)
Age 79, weight 81kg, SCr 0.78 on 11/18/19, last OV May 2021, afib indication, refill sent in

## 2019-12-11 ENCOUNTER — Encounter: Payer: Self-pay | Admitting: Internal Medicine

## 2019-12-11 ENCOUNTER — Other Ambulatory Visit: Payer: Self-pay

## 2019-12-11 ENCOUNTER — Ambulatory Visit: Payer: Medicare Other | Admitting: Internal Medicine

## 2019-12-11 VITALS — BP 120/64 | HR 50 | Ht 61.0 in | Wt 178.2 lb

## 2019-12-11 DIAGNOSIS — I480 Paroxysmal atrial fibrillation: Secondary | ICD-10-CM | POA: Diagnosis not present

## 2019-12-11 DIAGNOSIS — Z79899 Other long term (current) drug therapy: Secondary | ICD-10-CM

## 2019-12-11 MED ORDER — AMIODARONE HCL 200 MG PO TABS: ORAL_TABLET | ORAL | 3 refills | Status: DC

## 2019-12-11 NOTE — Progress Notes (Signed)
ELECTROPHYSIOLOGY CONSULT NOTE  Patient ID: Victoria [facility], MRN: [id number], DOB/AGE: 11-19-1940 79 y.o. Admit date: (Not on file) Date of Consult: 12/11/2019  Primary Physician: Burnard Hawthorne, FNP Primary CardiologistCE     ROSEANNE BIGWOOD is a 79 y.o. female who is being seen today for the evaluation of atrial fibrillation at the request of CE.    HPI [name] is a 79 y.o. female with recurrent atrial fibrillation managed with amiodarone and Eliquis.  Coronary artery disease with prior stenting  11/20. 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  Peripheral vascular disease with bilateral carotid endarterectomies.  Amio has been holding sinus  /Patient denies symptoms of GI intolerance, sun sensitivity, neurological symptoms attributable to amiodarone.    The patient denies chest pain, shortness of breath, nocturnal dyspnea, orthopnea or peripheral edema.  There have been no palpitations, lightheadedness or syncope.     DATE TEST EF   11/20 Echo   55-60 % AS mod-severe  11/20 LHC    % LAD- mod ;CX-OM1-80% Atherectomy and stenting  RCA non obstructive         Date Cr K Hgb TSH LFTs  1/21 0.75 3.9 14.6 1.56 21            Thromboembolic risk factors ( age -68, HTN-1, TIA/CVA-2,   Vasc disease -1, Gender-1) for a CHADSVASc Score of>=7  Past Medical History:  Diagnosis Date  . Arthritis   . CAD (coronary artery disease)    a. s/p remote OM stenting; b. 05/2017 Cath ([facility] [place]): LAD mild irregs, LCX 36m, OM2 40 ISR, RCA dominant, RPLV 50; c. 05/2019 Cath: LM nl, LAD mild-mod diff dzs, LCX 70, OM1 70-80p, 43m, RCA mod diff dzs; c. 05/2019 PCI/orbital atherectomy: OM1 60/80 (2.5x18 & 2.75x12 Resolute Onyx DES), LCX 75ost (3.5x12 Resolute Onyx DES).  . Carotid artery disease ([place])    a. s/p bilateral CEAs; b. 02/2018 Carotid U/S:  1-39% bilat ICA stenosis. F/u prn.  . Chronic back pain   . Hyperlipidemia   . Hypertension   . Hypothyroidism   . Juxtafoveal telangietasis of both eyes   . PAD (peripheral artery disease) ([place])    a. 05/2019 Cath: found to have 80% ost RCIA stenosis and 100 RSFA.  . Paroxysmal atrial fibrillation ([place]) 07/2018  . RBBB   . S/P TAVR (transcatheter aortic valve replacement)   . Severe aortic stenosis    a. 06/2019 TAVR: 21mm MDT Evolut Pro Plus TAVR; b. 06/2019 Echo: EF 60-65%, mod LVH. Gr1 DD. No rwma. AoV mean/peak grad 5.5/11.30mmHg. No AS.  Marland Kitchen Sleep apnea    does not wear CPAP  . TIA (transient ischemic attack)       Surgical History:  Past Surgical History:  Procedure Laterality Date  . AORTIC VALVE REPLACEMENT    . BACK SURGERY  08/2016  . BREAST SURGERY     Biopsy  . CARDIAC CATHETERIZATION    . CAROTID ENDARTERECTOMY Bilateral   . CHOLECYSTECTOMY    . CORONARY ANGIOGRAPHY N/A 06/05/2019   Procedure: CORONARY ANGIOGRAPHY (CATH LAB);  Surgeon: Burnell Blanks, MD;  Location: [facility];  Service: Cardiovascular;  Laterality: N/A;  . CORONARY ANGIOPLASTY    . CORONARY ATHERECTOMY N/A 06/06/2019   Procedure: CORONARY ATHERECTOMY;  Surgeon: Martinique, Peter M, MD;  Location: [facility];  Service:  Cardiovascular;  Laterality: N/A;  . RIGHT/LEFT HEART CATH AND CORONARY ANGIOGRAPHY Bilateral 05/23/2019   Procedure: RIGHT/LEFT HEART CATH AND CORONARY ANGIOGRAPHY;  Surgeon: Minna Merritts, MD;  Location: [facility];  Service: Cardiovascular;  Laterality: Bilateral;  . TEE WITHOUT CARDIOVERSION N/A 06/24/2019   Procedure: TRANSESOPHAGEAL ECHOCARDIOGRAM (TEE);  Surgeon: Burnell Blanks, MD;  Location: [facility];  Service: Open Heart Surgery;  Laterality: N/A;  . TONSILLECTOMY AND ADENOIDECTOMY       Home Meds: Current Meds  Medication Sig  . amiodarone (PACERONE) 200 MG tablet Take 1 tablet (200 mg) by mouth once daily  . amLODipine (NORVASC) 5  MG tablet Take 1 tablet (5 mg total) by mouth daily.  Marland Kitchen apixaban (ELIQUIS) 5 MG TABS tablet Take 1 tablet (5 mg total) by mouth 2 (two) times daily.  Marland Kitchen atorvastatin (LIPITOR) 80 MG tablet Take 1 tablet (80 mg total) by mouth daily at 6 PM.  . clopidogrel (PLAVIX) 75 MG tablet TAKE 1 TABLET BY MOUTH  DAILY  . ezetimibe (ZETIA) 10 MG tablet Take 1 tablet (10 mg total) by mouth daily.  . furosemide (LASIX) 20 MG tablet TAKE 1 TABLET BY MOUTH  DAILY  . isosorbide mononitrate (IMDUR) 30 MG 24 hr tablet Take 1 tablet (30 mg total) by mouth 2 (two) times daily.  Marland Kitchen levothyroxine (SYNTHROID) 25 MCG tablet TAKE 1 TABLET BY MOUTH IN  THE MORNING ON AN EMPTY  STOMACH WITH WATER - NO  FOOD OR MEDICATIONS FOR 1/2 HOUR  . losartan (COZAAR) 100 MG tablet Take 0.5 tablets (50 mg total) by mouth daily.  . Misc Natural Products (FOCUSED MIND PO) Take 1 tablet by mouth daily.   . Multiple Vitamins-Minerals (ZINC PO) Take 1 tablet by mouth daily.   . nitroGLYCERIN (NITROSTAT) 0.3 MG SL tablet DISSOLVE 1 TABLET UNDER THE TONGUE EVERY 5 MINUTES AS  NEEDED FOR CHEST PAIN. MAX  OF 3 TABLETS IN 15 MINUTES. CALL 911 IF PAIN PERSISTS.  [name] Glycol-Propyl Glycol (SYSTANE) 0.4-0.3 % SOLN Place 1 drop into both eyes 2 (two) times daily as needed (Dry eye).  . polyethylene glycol (MIRALAX / GLYCOLAX) 17 g packet Take 17 g by mouth daily as needed for moderate constipation.  . potassium chloride (KLOR-CON) 10 MEQ tablet Take 1 tablet (10 mEq total) by mouth daily.  Marland Kitchen VITAMIN D PO Take 1 tablet by mouth daily.     Allergies:  Allergies  Allergen Reactions  . Iodinated Diagnostic Agents     Hives all over body.   . Codeine Rash  . Penicillins Rash    Did it involve swelling of the face/tongue/throat, SOB, or low BP? No Did it involve sudden or severe rash/hives, skin peeling, or any reaction on the inside of your mouth or nose? No Did you need to seek medical attention at a hospital or doctor's office? No When did  it last happen?over 50 years ago If all above answers are "NO", may proceed with cephalosporin use.    Social History   Socioeconomic History  . Marital status: Married    Spouse name: Not on file  . Number of children: 3  . Years of education: Not on file  . Highest education level: Not on file  Occupational History  . 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   Tobacco Use  . Smoking status: Former Smoker    Types: Cigarettes  . Smokeless tobacco: Never Used  Substance and Sexual Activity  . Alcohol use: No  .  Drug use: No  . Sexual activity: Yes  Other Topics Concern  . Not on file  Social History Narrative  . Not on file   Social Determinants of Health   Financial Resource Strain:   . Difficulty of Paying Living Expenses:   Food Insecurity:   . Worried About Charity fundraiser in the Last Year:   . Arboriculturist in the Last Year:   Transportation Needs:   . Film/video editor (Medical):   Marland Kitchen Lack of Transportation (Non-Medical):   Physical Activity:   . Days of Exercise per Week:   . Minutes of Exercise per Session:   Stress:   . Feeling of Stress :   Social Connections:   . Frequency of Communication with Friends and Family:   . Frequency of Social Gatherings with Friends and Family:   . Attends Religious Services:   . Active Member of Clubs or Organizations:   . Attends Archivist Meetings:   Marland Kitchen Marital Status:   Intimate Partner Violence:   . Fear of Current or Ex-Partner:   . Emotionally Abused:   Marland Kitchen Physically Abused:   . Sexually Abused:      Family History  Problem Relation Age of Onset  . Heart disease Mother   . Hyperlipidemia Mother   . Hypertension Mother   . Alcohol abuse Father   . COPD Father   . Heart attack Father   . Arthritis Sister   . Hypertension Sister   . COPD Sister   . Heart attack Sister   . Heart disease Sister   . Hypertension Sister   . Pancreatic cancer Sister      ROS:  Please see the history  of present illness.     All other systems reviewed and negative.    Physical Exam: Blood pressure 120/64, pulse (!) 50, height 5\' 1"  (1.549 m), weight 178 lb 4 oz (80.9 kg), SpO2 98 %. General: Well developed, well nourished female in no acute distress. Head: Normocephalic, atraumatic, sclera non-icteric, no xanthomas, nares are without discharge. EENT: normal  Lymph Nodes:  none Neck: Negative for carotid bruits. JVD not elevated. Back:without scoliosis kyphosis Lungs: Clear bilaterally to auscultation without wheezes, rales, or rhonchi. Breathing is unlabored. Heart: RRR with S1 S2. no murmur . No rubs, or gallops appreciated. Abdomen: Soft, non-tender, non-distended with normoactive bowel sounds. No hepatomegaly. No rebound/guarding. No obvious abdominal masses. Msk:  Strength and tone appear normal for age. Extremities: No clubbing or cyanosis. No  edema.  Distal pedal pulses are 2+ and equal bilaterally. Skin: Warm and Dry Neuro: Alert and oriented X 3. CN III-XII intact Grossly normal sensory and motor function . Psych:  Responds to questions appropriately with a normal affect.      Labs: Cardiac Enzymes No results for input(s): CKTOTAL, CKMB, TROPONINI in the last 72 hours. CBC Lab Results  Component Value Date   WBC 7.1 11/18/2019   HGB 14.0 11/18/2019   HCT 41.1 11/18/2019   MCV 97 11/18/2019   PLT 221 11/18/2019   PROTIME: No results for input(s): LABPROT, INR in the last 72 hours. Chemistry No results for input(s): NA, K, CL, CO2, BUN, CREATININE, CALCIUM, PROT, BILITOT, ALKPHOS, ALT, AST, GLUCOSE in the last 168 hours.  Invalid input(s): LABALBU Lipids Lab Results  Component Value Date   CHOL 146 09/03/2019   HDL 50 09/03/2019   LDLCALC 74 09/03/2019   TRIG 135 09/03/2019   BNP No results found for:  PROBNP Thyroid Function Tests: No results for input(s): TSH, T4TOTAL, T3FREE, THYROIDAB in the last 72 hours.  Invalid input(s): FREET3 Miscellaneous No  results found for: DDIMER  Radiology/Studies:  No results found.  EKG: Sinus at 50 Interval 18/14/47 Right bundle branch block   Assessment and Plan:  Atrial fibrillation-persistent  Aortic stenosis status post TAVR 11/20  Coronary artery disease status post stenting 11/20  Right bundle branch block  Sinus bradycardia     The patient atrial fibrillation is currently controlled with amiodarone.  Bradycardia is likely going to be an issue and will require a long-term different strategy either with  pacing, catheter ablation or consideration of dofetilide as an alternative to amio  For now she is tolerating the amio reasonably well However, the patient and her husband are moving in 2 weeks to [place] permanently.  Hence, I have recommended that we make no changes and that they establish with cardiology in [place] who can direct care going forward.        Virl Axe

## 2019-12-11 NOTE — Patient Instructions (Signed)
Medication Instructions:  - Your physician recommends that you continue on your current medications as directed. Please refer to the Current Medication list given to you today.  *If you need a refill on your cardiac medications before your next appointment, please call your pharmacy*   Lab Work: - Your physician recommends that you have lab work today: Liver/ TSH   If you have labs (blood work) drawn today and your tests are completely normal, you will receive your results only by: Marland Kitchen MyChart Message (if you have MyChart) OR . A paper copy in the mail If you have any lab test that is abnormal or we need to change your treatment, we will call you to review the results.   Testing/Procedures: - none ordered   Follow-Up: At [facility], you and your health needs are our priority.  As part of our continuing mission to provide you with exceptional heart care, we have created designated Provider Care Teams.  These Care Teams include your primary Cardiologist (physician) and Advanced Practice Providers (APPs -  Physician Assistants and Nurse Practitioners) who all work together to provide you with the care you need, when you need it.  We recommend signing up for the patient portal called "MyChart".  Sign up information is provided on this After Visit Summary.  MyChart is used to connect with patients for Virtual Visits (Telemedicine).  Patients are able to view lab/test results, encounter notes, upcoming appointments, etc.  Non-urgent messages can be sent to your provider as well.   To learn more about what you can do with MyChart, go to [email or web address].    Your next appointment:   As needed   The format for your next appointment:   In Person  Provider:   Virl Axe, MD   Other Instructions Good luck with your move!!

## 2019-12-12 LAB — HEPATIC FUNCTION PANEL
ALT: 22 IU/L (ref 0–32)
AST: 24 IU/L (ref 0–40)
Albumin: 4.4 g/dL (ref 3.7–4.7)
Alkaline Phosphatase: 78 IU/L (ref 48–121)
Bilirubin Total: 0.6 mg/dL (ref 0.0–1.2)
Bilirubin, Direct: 0.18 mg/dL (ref 0.00–0.40)
Total Protein: 6.7 g/dL (ref 6.0–8.5)

## 2019-12-12 LAB — TSH: TSH: 3.73 u[IU]/mL (ref 0.450–4.500)

## 2020-01-07 ENCOUNTER — Telehealth: Payer: Self-pay | Admitting: Internal Medicine

## 2020-01-07 NOTE — Telephone Encounter (Signed)
If her dyspnea has not improved since her last visit with Jacquelyn, I recommend that we obtain an echo and see her in the office before proceeding with surgery.  If shortness of breath has improved, she can proceed with planned orthopedic surgery.  I would recommend stopping clopidogrel 1 week before surgery and starting aspirin 81 mg daily (to be continued during the perioperative period).  Apixaban should be held 2 days before surgery and restarted when it is felt safe to do so by her orthopedist.  Nelva Bush, MD [place]

## 2020-01-07 NOTE — Telephone Encounter (Signed)
   Primary Cardiologist: Nelva Bush, MD  Chart reviewed as part of pre-operative protocol coverage.   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  The patient was dealing with DOE when seen by Marrianne Mood, PA 11/18/18. Felt symptoms could be due to deconditioning but recommended cardiac work up if no improvement.   Seen by Dr.Klein 12/11/19. Per note, she was tolerating amiodarone. She was planning to move to [place] and establishing cardiac care there.   She on Plavix (PCI was done 05/2019) and Eliquis.   I have left voice mail to call back to assess cardiac symptoms.   [place], [place] 01/07/2020, 3:00 PM

## 2020-01-07 NOTE — Telephone Encounter (Signed)
   [facility] Pre-operative Risk Assessment    HEARTCARE STAFF: - Please ensure there is not already an duplicate clearance open for this procedure. - Under Visit Info/Reason for Call, type in Other and utilize the format Clearance MM/DD/YY or Clearance TBD. Do not use dashes or single digits. - If request is for dental extraction, please clarify the # of teeth to be extracted.  Request for surgical clearance:  1. What type of surgery is being performed? Left partial knee replacement    2. When is this surgery scheduled? TBD   3. What type of clearance is required (medical clearance vs. Pharmacy clearance to hold med vs. Both)? both  4. Are there any medications that need to be held prior to surgery and how long? Not listed, please advise if needed  5. Practice name and name of physician performing surgery? [facility] - Dr Marchia Bond  6. What is the office phone number? [phone]   7.   What is the office fax number? [phone]  8.   Anesthesia type (None, local, MAC, general) ? Not listed    Ace Gins 01/07/2020, 2:12 PM  _________________________________________________________________   (provider comments below)

## 2020-01-12 NOTE — Telephone Encounter (Signed)
   Primary Cardiologist: Nelva Bush, MD  Chart reviewed as part of pre-operative protocol coverage. Need to discuss with pt whether she is still having SOB. Got voicemail. LMOMTCB.  Charlie Pitter, PA-C 01/12/2020, 9:05 AM

## 2020-01-14 NOTE — Telephone Encounter (Signed)
Patient called 01/14/2020 at 11:05 AM.  Detailed voice message left about calling back.

## 2020-01-14 NOTE — Telephone Encounter (Signed)
Mrs. Cammon notified as instructed by telephone.  Patient states understanding.

## 2020-01-14 NOTE — Telephone Encounter (Signed)
Need referral placed. 

## 2020-01-14 NOTE — Telephone Encounter (Signed)
She is being followed by Carter Kitten at [facility] for her current knee problem.  Nothing is needed additionally from me. She is his patient.

## 2020-01-14 NOTE — Telephone Encounter (Signed)
Butch Penny can you please call pt?  She is established pt.  No referral needed.

## 2020-01-16 ENCOUNTER — Telehealth: Payer: Self-pay | Admitting: Physician Assistant

## 2020-01-16 NOTE — Telephone Encounter (Signed)
  [facility]   MULTIDISCIPLINARY HEART VALVE TEAM  Pt's daughter called to see if we could help facilitate a refer all to   Dr. Consuello Masse [facility] [address], [place] [address] Contact Phone: [phone]  Will have our triage team help them secure a cardiologist in her new home. She is a pt of Dr. Marisue Humble.  Angelena Form PA-C  [facility]

## 2020-02-18 ENCOUNTER — Ambulatory Visit: Payer: Medicare Other | Admitting: Internal Medicine

## 2020-02-24 ENCOUNTER — Telehealth: Payer: Self-pay | Admitting: Internal Medicine

## 2020-02-24 NOTE — Telephone Encounter (Signed)
° °  [facility] Pre-operative Risk Assessment    HEARTCARE STAFF: - Please ensure there is not already an duplicate clearance open for this procedure. - Under Visit Info/Reason for Call, type in Other and utilize the format Clearance MM/DD/YY or Clearance TBD. Do not use dashes or single digits. - If request is for dental extraction, please clarify the # of teeth to be extracted.  Request for surgical clearance:  1. What type of surgery is being performed? Left partial knee replacement   2. When is this surgery scheduled? TBD  3. What type of clearance is required (medical clearance vs. Pharmacy clearance to hold med vs. Both)? both  4. Are there any medications that need to be held prior to surgery and how long? Not listed, please advise if needed  5. Practice name and name of physician performing surgery? [facility] - Dr Marchia Bond  6. What is the office phone number? [phone] x [phone] Sherri    7.   What is the office fax number? [phone]  8.   Anesthesia type (None, local, MAC, general) ? Not listed    Ace Gins 02/24/2020, 4:16 PM  _________________________________________________________________   (provider comments below)

## 2020-02-25 ENCOUNTER — Telehealth: Payer: Self-pay | Admitting: Family

## 2020-02-25 NOTE — Telephone Encounter (Signed)
Called and spoke to Victoria [facility]. She states that she has moved out of the state and does not need to schedule for appointments. Taken Victoria Burns off of the PCP list.

## 2020-02-25 NOTE — Telephone Encounter (Signed)
close

## 2020-02-25 NOTE — Telephone Encounter (Signed)
Call pt She needs pre op appt with me  We have paperwork from orthopedic that needs to be completed

## 2020-02-25 NOTE — Telephone Encounter (Signed)
I contacted the patient today by phone.  Since we spoke to her last she has moved to [place].  She will not be having her knee surgery here.  Kerin Ransom PA-C 02/25/2020 10:28 AM

## 2020-04-06 ENCOUNTER — Telehealth: Payer: Self-pay | Admitting: Physician Assistant

## 2020-04-06 ENCOUNTER — Other Ambulatory Visit: Payer: Self-pay | Admitting: Physician Assistant

## 2020-04-06 DIAGNOSIS — Z952 Presence of prosthetic heart valve: Secondary | ICD-10-CM

## 2020-04-06 NOTE — Telephone Encounter (Signed)
°  [place] VALVE TEAM  Patient called to schedule 1 year TAVR assessment. She is now living in [place] and followed by cardiology there. She declined an apt with [place], but is doing quite well. Currently being followed by Dr. Rozetta Nunnery in [facility] and recently had an echo which was reportedly "fine.". She has NYHA class I symptoms based on my phone convo with her. She is mostly limited by knee pain and scheduled for upcoming knee surgery.   [facility] Cardiomyopathy Questionnaire  KCCQ-12 04/06/2020 07/17/2019 05/30/2019  1 a. Ability to shower/bathe Not at all limited Not at all limited Moderately limited  1 b. Ability to walk 1 block Other, Did not do Slightly limited Extremely limited  1 c. Ability to hurry/jog Other, Did not do Moderately limited Extremely limited  2. Edema feet/ankles/legs Never over the past 2 weeks Never over the past 2 weeks Less than once a week  3. Limited by fatigue At least once a day Less than once a week Several times a day  4. Limited by dyspnea Never over the past 2 weeks Never over the past 2 weeks Several times a day  5. Sitting up / on 3+ pillows Never over the past 2 weeks Never over the past 2 weeks Less than once a week  6. Limited enjoyment of life Not limited at all Not limited at all Limited quite a bit  7. Rest of life w/ symptoms Completely satisfied Completely satisfied Not at all satisfied  8 a. Participation in hobbies Did not limit at all Did not limit at all Limited quite a bit  8 b. Participation in chores Did not limit at all Did not limit at all Severely limited  8 c. Visiting family/friends Did not limit at all Did not limit at all Limited quite a bit     Angelena Form PA-C  [facility]

## 2020-04-10 ENCOUNTER — Other Ambulatory Visit: Payer: Self-pay | Admitting: Physician Assistant

## 2020-04-10 ENCOUNTER — Other Ambulatory Visit: Payer: Self-pay | Admitting: Cardiovascular Disease

## 2020-04-10 ENCOUNTER — Other Ambulatory Visit: Payer: Self-pay | Admitting: Internal Medicine

## 2020-04-12 NOTE — Telephone Encounter (Signed)
Pt's age 79, wt 80.9 kg, SCr 0.78, CrCl 74.69, last ov w/ SK 12/11/19.

## 2020-04-12 NOTE — Telephone Encounter (Signed)
Refill Request.  

## 2020-09-25 IMAGING — DX DG CHEST 1V PORT
1 series · 1 of 1 positions shown · non-contrast
Comparison: None.

CLINICAL DATA: Status post TAVR

EXAM:
PORTABLE CHEST 1 VIEW

[chest ap]
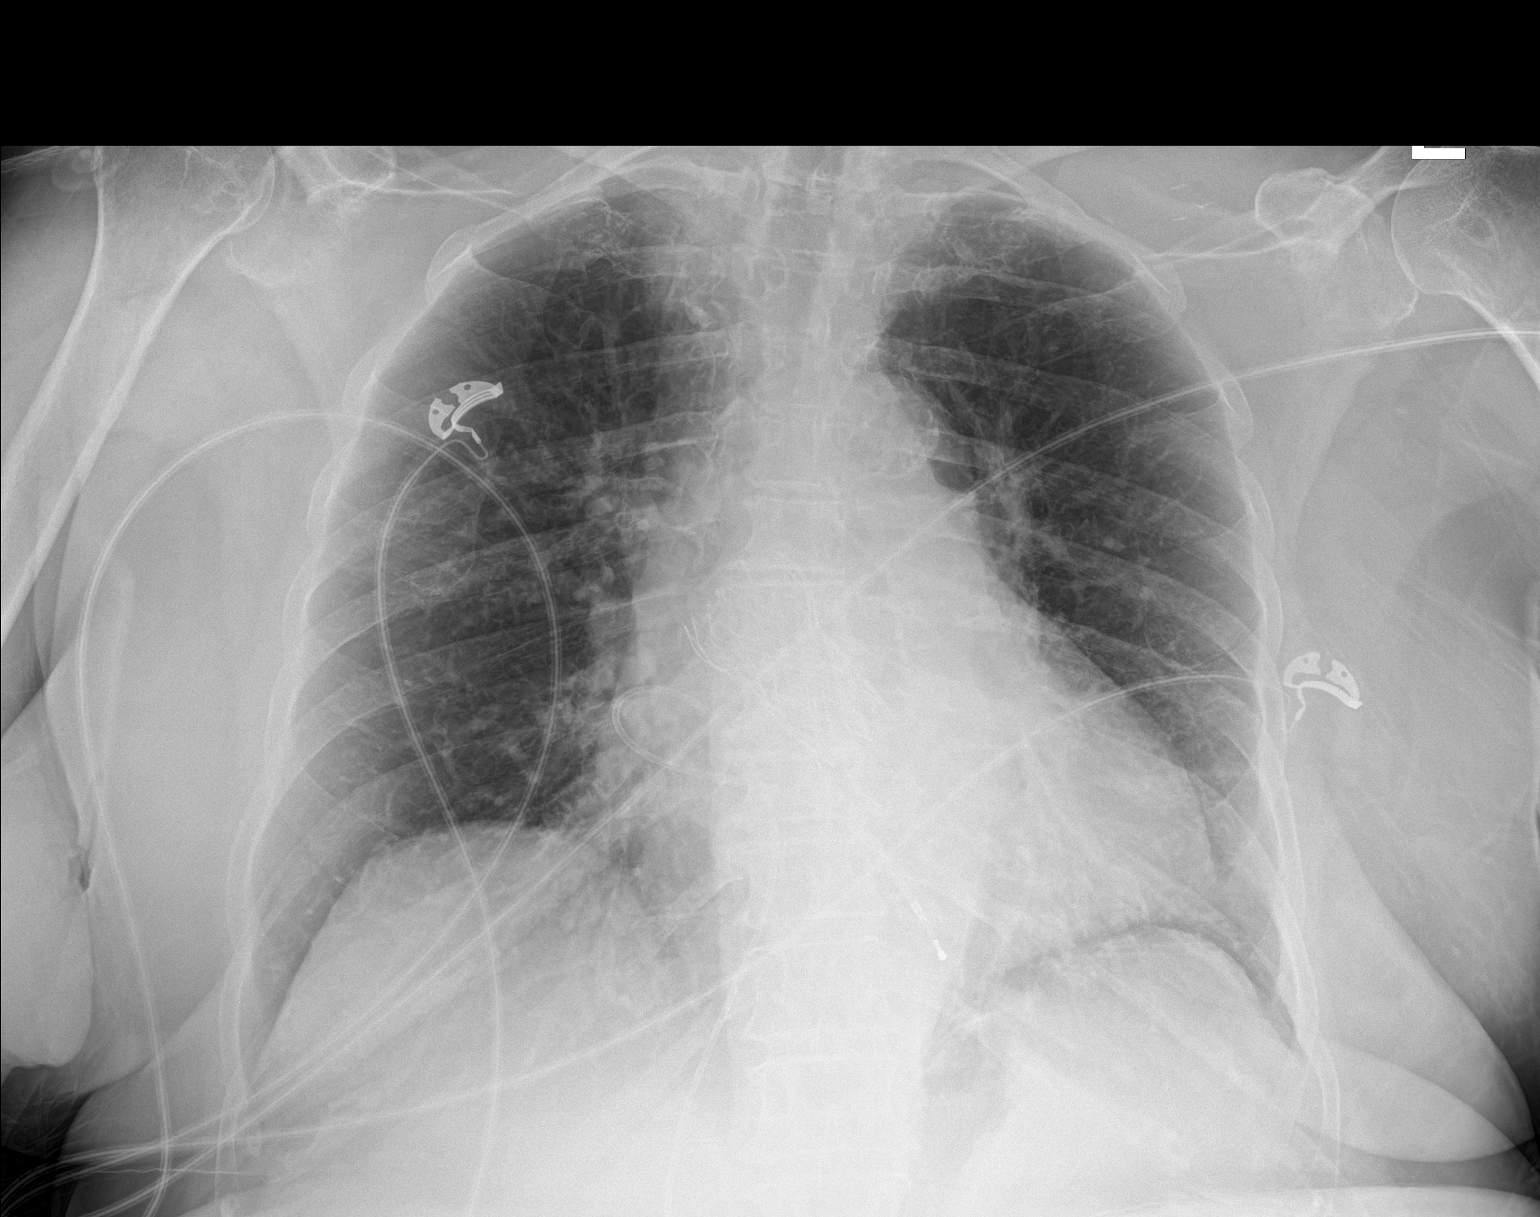

[1 of 1 positions shown; findings below may reference images not displayed]

FINDINGS: The patient is status post TAVR. An EKG lead projects over the
heart. No pneumothorax. Cardiomegaly. The hila and mediastinum are
unremarkable. No other acute abnormalities.
IMPRESSION: Status post TAVR. No other acute abnormalities. A pacemaker lead
remains in place.

## 2020-10-28 ENCOUNTER — Ambulatory Visit: Payer: Medicare Other

## 2020-11-02 IMAGING — DX DG CHEST 1V PORT
1 series · 1 of 1 positions shown · non-contrast
Comparison: 06/24/2019

CLINICAL DATA: Chest pain

EXAM:
PORTABLE CHEST 1 VIEW

[chest ap]
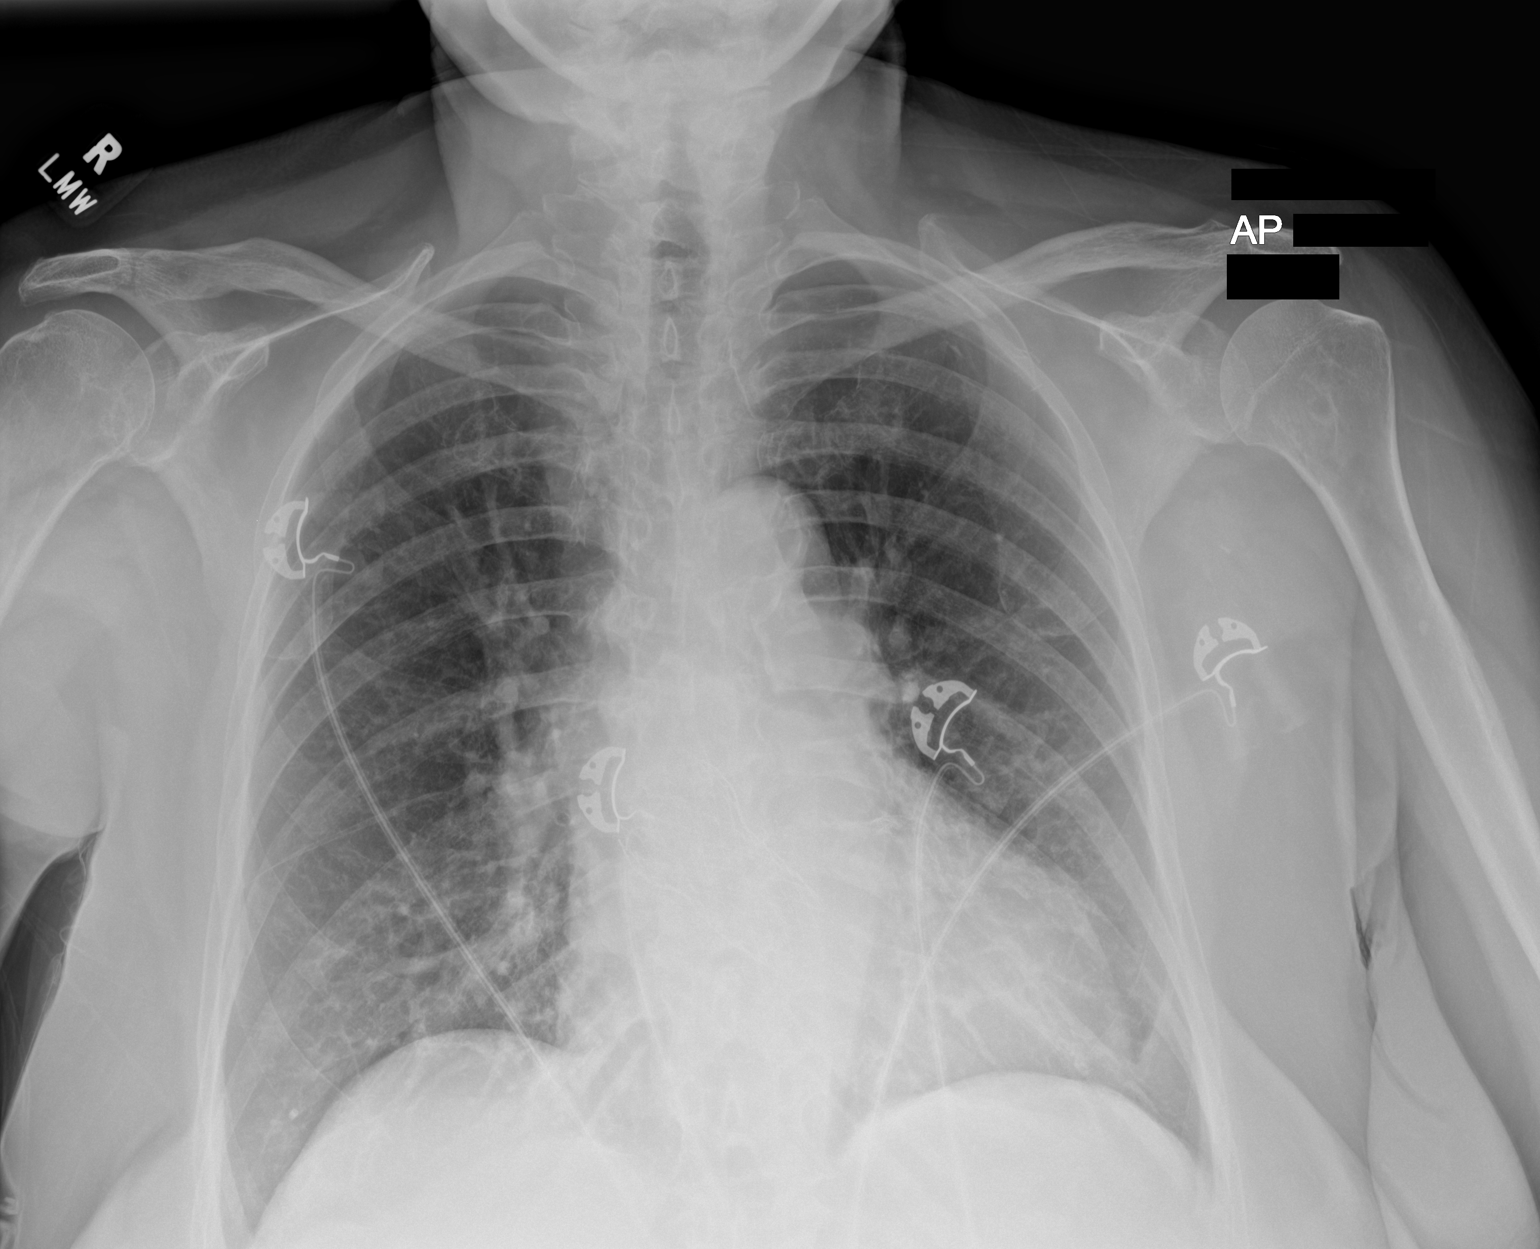

[1 of 1 positions shown; findings below may reference images not displayed]

FINDINGS: Transcatheter aortic valve replacement. Stable mild cardiac
enlargement. There is no edema, consolidation, effusion, or
pneumothorax. No osseous findings.
IMPRESSION: No evidence of active disease.

## 2021-01-02 ENCOUNTER — Other Ambulatory Visit: Payer: Self-pay | Admitting: Internal Medicine

## 2021-01-04 IMAGING — MR MR KNEE*L* W/O CM
4 of 7 series · 22 of 40 positions shown · non-contrast
Comparison: Radiographs dated 08/28/2019

CLINICAL DATA: Left knee pain for 6 weeks. No known injury.

EXAM:
MRI OF THE LEFT KNEE WITHOUT CONTRAST
TECHNIQUE: Multiplanar, multisequence MR imaging of the knee was performed. No
intravenous contrast was administered.

[Series 4: T2 fat-sat · coronal · 4.0mm · 0.59mm/px · 5 of 24 slices shown (1 of 2)]
[im 1/24]
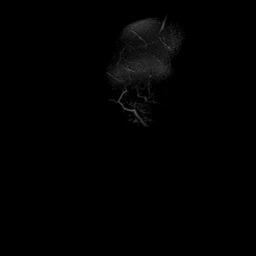
[im 6/24]
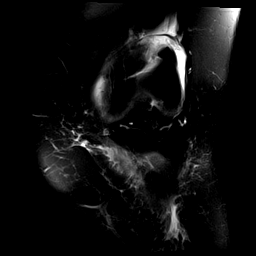
[im 12/24]
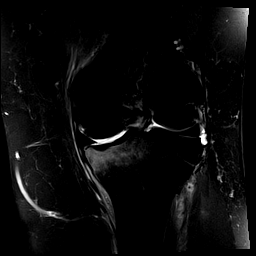
[im 18/24]
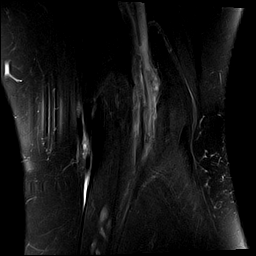
[im 24/24]
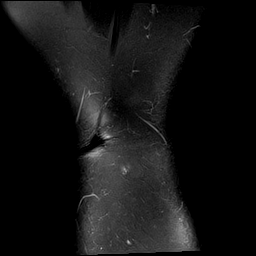

[Series 5: T1 · coronal · 4.0mm · 0.29mm/px · 3 of 24 slices shown]
[im 1/24]
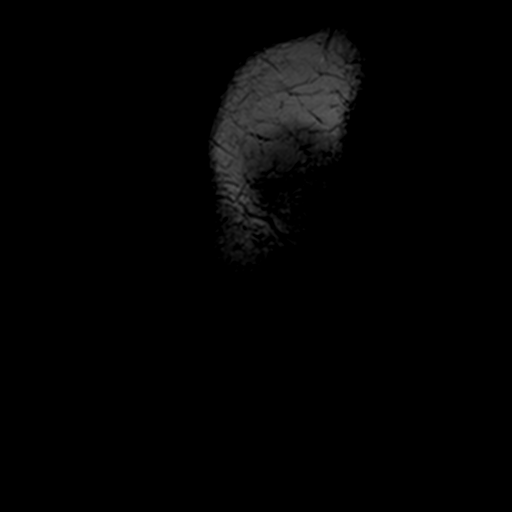
[im 12/24]
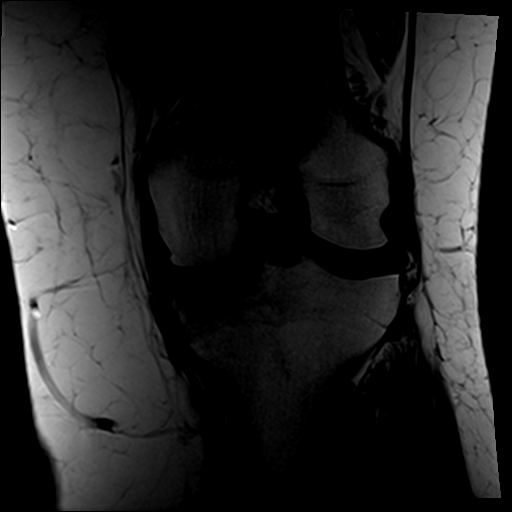
[im 24/24]
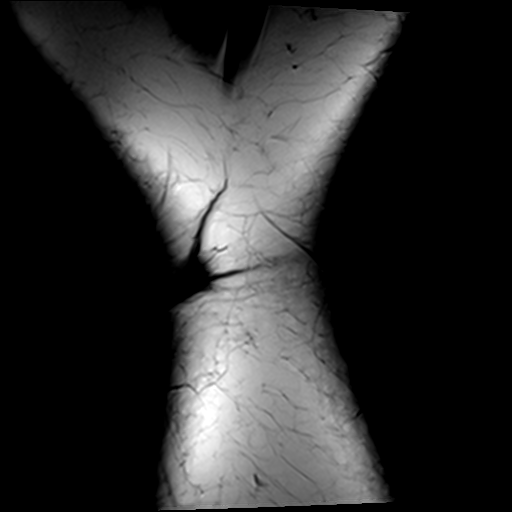

[Series 7: T2 fat-sat · sagittal · 3.0mm · 0.29mm/px · 7 of 30 slices shown (2 of 2)]
[im 1/30]
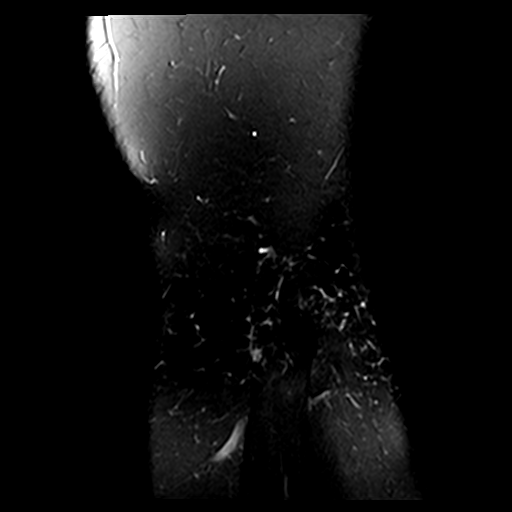
[im 5/30]
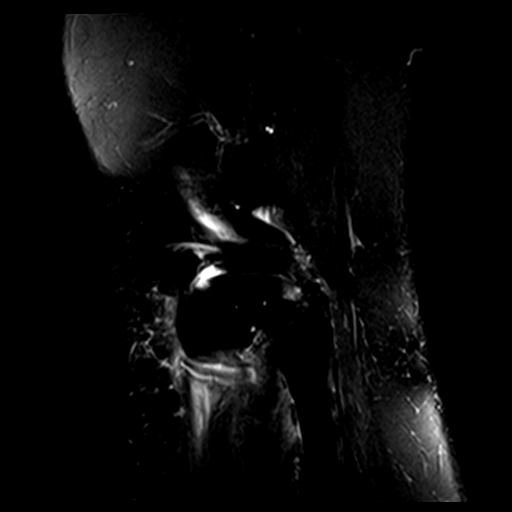
[im 10/30]
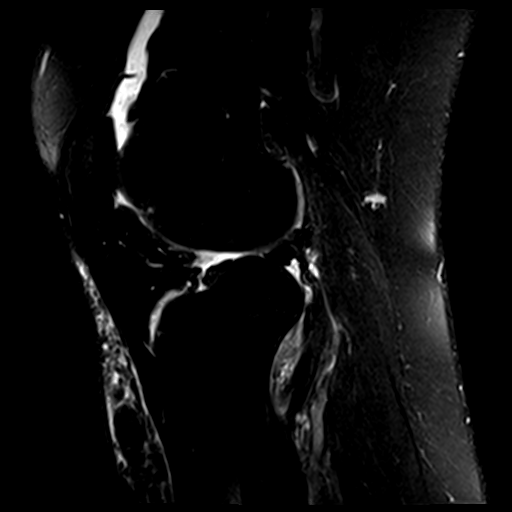
[im 15/30]
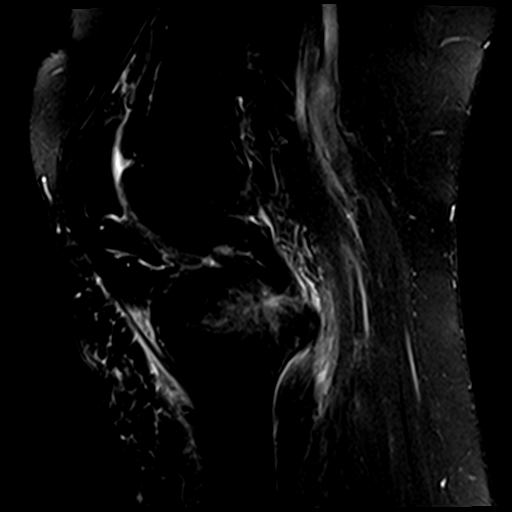
[im 20/30]
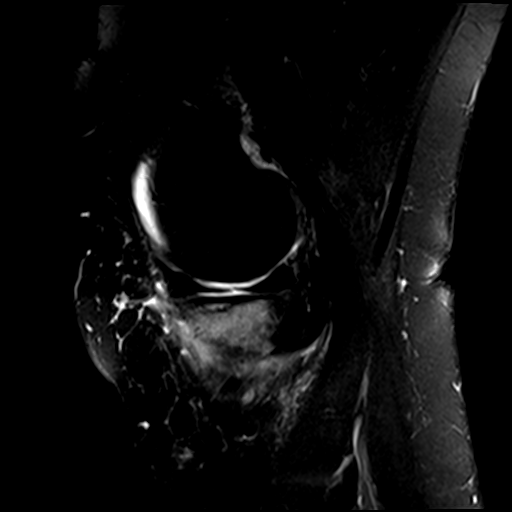
[im 25/30]
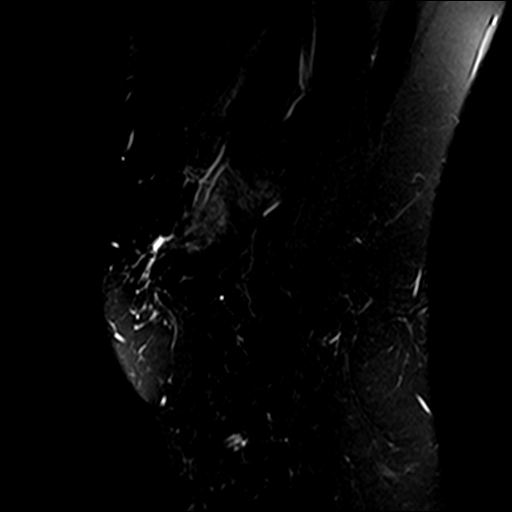
[im 30/30]
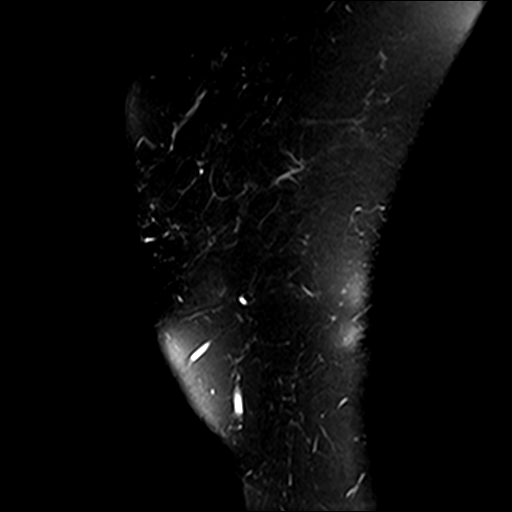

[Series 8: PD fat-sat · sagittal · 3.0mm · 0.29mm/px · 7 of 30 slices shown]
[im 1/30]
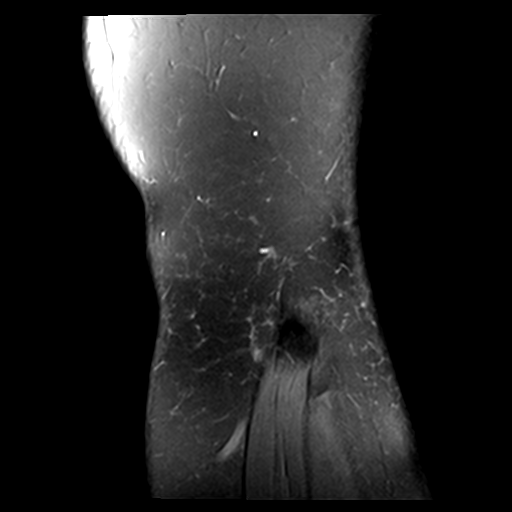
[im 5/30]
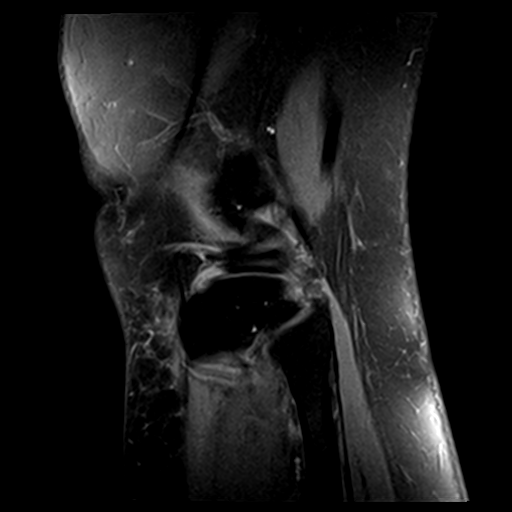
[im 10/30]
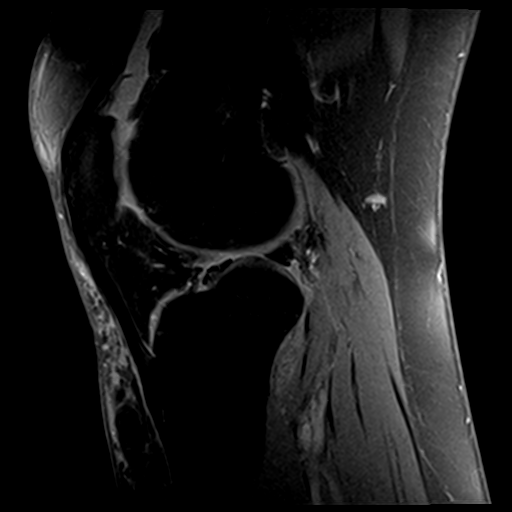
[im 15/30]
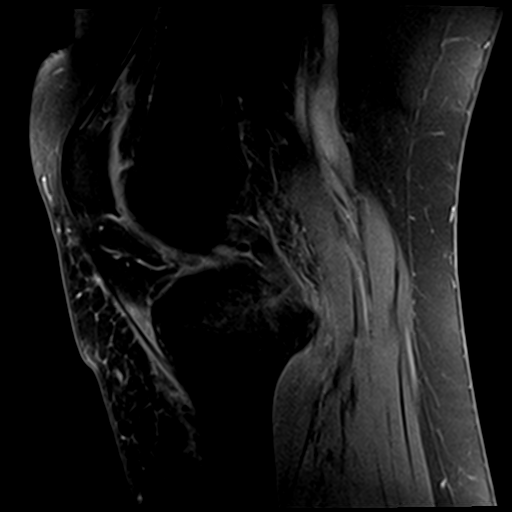
[im 20/30]
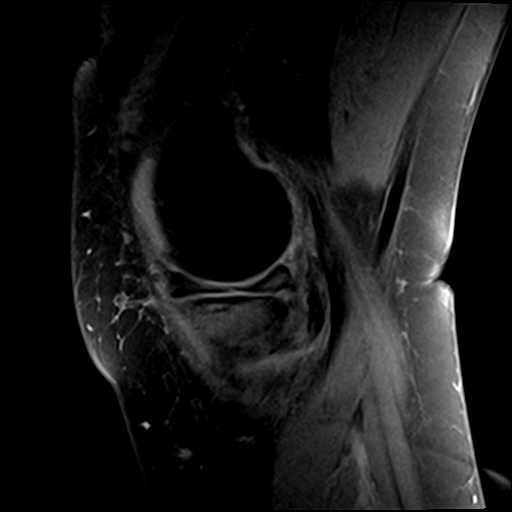
[im 25/30]
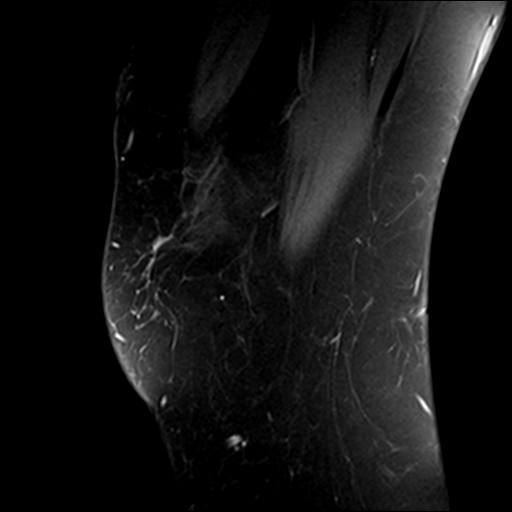
[im 30/30]
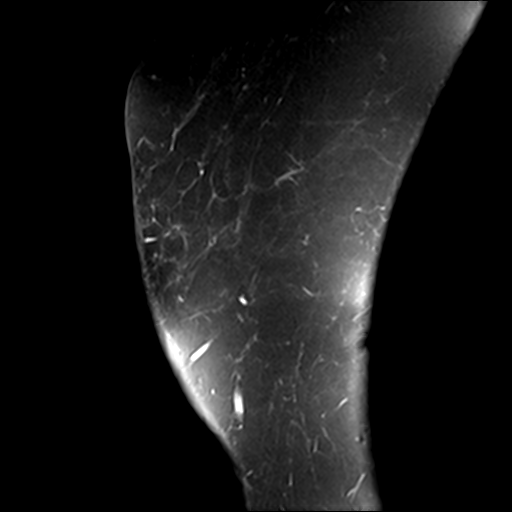

[22 of 40 positions shown; findings below may reference images not displayed]

FINDINGS: MENISCI

Medial meniscus: There is complete radial tear of the root of the
posterior horn of the medial meniscus with secondary meniscal
peripheral subluxation. Intrinsic degeneration of the medial
meniscus.

Lateral meniscus:  Intrinsic degeneration without a tear.

LIGAMENTS

Cruciates:  Intact.

Collaterals: Intact. There is fluid superficial and deep to the
medial collateral ligament consistent with mild medial bursitis.
There is also adjacent mild pes anserine bursitis.

CARTILAGE

Patellofemoral: There is denuding of the articular cartilage of the
medial facet of the patella. Focal areas of cartilage loss in the
trochlear groove of the distal femur.

Medial: Full-thickness cartilage loss of the tibial plateau and
central portion of the femoral condyle.

Lateral:  Normal.

Joint: Moderate joint effusion. Normal Hoffa's fat pad. No plical
thickening.

Popliteal Fossa:  Tiny Baker's cyst. Intact popliteus tendon.

Extensor Mechanism:  Normal.

Bones: Focal insufficiency fracture of medial tibial plateau with
prominent adjacent edema in the proximal tibia. This probably
accounts for the adjacent medial bursitis. The insufficiency
fracture is likely secondary to altered stress due to the meniscal
root tear.

Other: None
IMPRESSION: 1. Complete radial tear of the root of the posterior horn of the
medial meniscus with secondary meniscal peripheral subluxation.
2. Focal insufficiency fracture of the medial tibial plateau with
secondary edema in the proximal tibia.
3. Extensive full-thickness cartilage loss in the medial and
patellofemoral compartments.
4. Moderate joint effusion.

## 2021-01-05 ENCOUNTER — Other Ambulatory Visit: Payer: Self-pay | Admitting: Internal Medicine

## 2021-01-05 NOTE — Telephone Encounter (Signed)
This is a [facility] pt 

## 2021-01-21 ENCOUNTER — Other Ambulatory Visit: Payer: Self-pay | Admitting: Internal Medicine

## 2021-05-21 ENCOUNTER — Other Ambulatory Visit: Payer: Self-pay | Admitting: Internal Medicine

## 2021-05-23 NOTE — Telephone Encounter (Signed)
Please contact pt for future appointment. Pt needing refills must be seen yearly for continuous refills or contact PCP.

## 2021-08-04 NOTE — Addendum Note (Signed)
Addended by: Britt Bottom on: 08/04/2021 09:25 AM   Modules accepted: Orders

## 2021-08-04 NOTE — Telephone Encounter (Signed)
Attempted to schedule.  

## 2021-08-04 NOTE — Telephone Encounter (Signed)
Patient declined . Moved to [facility]
